# Patient Record
Sex: Female | Born: 1960 | Race: Black or African American | Hispanic: No | Marital: Single | Smoking: Former smoker
Health system: Southern US, Community
[De-identification: ages and names within clinical notes are randomized; demographics above are authoritative.]

## PROBLEM LIST (undated history)

## (undated) ENCOUNTER — Emergency Department (HOSPITAL_COMMUNITY): Payer: Medicaid Other | Source: Home / Self Care

## (undated) DIAGNOSIS — D649 Anemia, unspecified: Secondary | ICD-10-CM

## (undated) DIAGNOSIS — C50919 Malignant neoplasm of unspecified site of unspecified female breast: Secondary | ICD-10-CM

## (undated) DIAGNOSIS — Z923 Personal history of irradiation: Secondary | ICD-10-CM

## (undated) DIAGNOSIS — Z9221 Personal history of antineoplastic chemotherapy: Secondary | ICD-10-CM

## (undated) DIAGNOSIS — K7010 Alcoholic hepatitis without ascites: Secondary | ICD-10-CM

## (undated) DIAGNOSIS — F1020 Alcohol dependence, uncomplicated: Secondary | ICD-10-CM

## (undated) DIAGNOSIS — I10 Essential (primary) hypertension: Secondary | ICD-10-CM

## (undated) DIAGNOSIS — R0602 Shortness of breath: Secondary | ICD-10-CM

## (undated) HISTORY — DX: Alcoholic hepatitis without ascites: K70.10

## (undated) HISTORY — DX: Essential (primary) hypertension: I10

## (undated) HISTORY — DX: Alcohol dependence, uncomplicated: F10.20

## (undated) HISTORY — DX: Malignant neoplasm of unspecified site of unspecified female breast: C50.919

## (undated) HISTORY — DX: Anemia, unspecified: D64.9

---

## 1999-08-15 DIAGNOSIS — C50919 Malignant neoplasm of unspecified site of unspecified female breast: Secondary | ICD-10-CM

## 1999-08-15 HISTORY — PX: OTHER SURGICAL HISTORY: SHX169

## 1999-08-15 HISTORY — PX: BREAST LUMPECTOMY: SHX2

## 1999-08-15 HISTORY — DX: Malignant neoplasm of unspecified site of unspecified female breast: C50.919

## 2001-05-24 ENCOUNTER — Ambulatory Visit (HOSPITAL_COMMUNITY): Admission: RE | Admit: 2001-05-24 | Discharge: 2001-05-24 | Payer: Self-pay | Admitting: Family Medicine

## 2001-08-02 ENCOUNTER — Encounter: Payer: Self-pay | Admitting: Family Medicine

## 2001-08-02 ENCOUNTER — Encounter: Admission: RE | Admit: 2001-08-02 | Discharge: 2001-08-02 | Payer: Self-pay | Admitting: Family Medicine

## 2001-08-02 ENCOUNTER — Other Ambulatory Visit: Admission: RE | Admit: 2001-08-02 | Discharge: 2001-08-02 | Payer: Self-pay | Admitting: Radiology

## 2001-08-02 ENCOUNTER — Encounter (INDEPENDENT_AMBULATORY_CARE_PROVIDER_SITE_OTHER): Payer: Self-pay

## 2001-08-29 ENCOUNTER — Encounter (HOSPITAL_BASED_OUTPATIENT_CLINIC_OR_DEPARTMENT_OTHER): Payer: Self-pay | Admitting: General Surgery

## 2001-09-02 ENCOUNTER — Encounter (INDEPENDENT_AMBULATORY_CARE_PROVIDER_SITE_OTHER): Payer: Self-pay | Admitting: Specialist

## 2001-09-02 ENCOUNTER — Ambulatory Visit (HOSPITAL_COMMUNITY): Admission: RE | Admit: 2001-09-02 | Discharge: 2001-09-02 | Payer: Self-pay | Admitting: General Surgery

## 2002-01-30 ENCOUNTER — Ambulatory Visit: Admission: RE | Admit: 2002-01-30 | Discharge: 2002-04-30 | Payer: Self-pay | Admitting: *Deleted

## 2002-02-26 ENCOUNTER — Encounter: Admission: RE | Admit: 2002-02-26 | Discharge: 2002-02-26 | Payer: Self-pay | Admitting: *Deleted

## 2002-03-27 ENCOUNTER — Encounter: Admission: RE | Admit: 2002-03-27 | Discharge: 2002-03-27 | Payer: Self-pay | Admitting: *Deleted

## 2002-05-21 ENCOUNTER — Ambulatory Visit: Admission: RE | Admit: 2002-05-21 | Discharge: 2002-05-21 | Payer: Self-pay | Admitting: *Deleted

## 2002-05-26 ENCOUNTER — Encounter: Admission: RE | Admit: 2002-05-26 | Discharge: 2002-05-26 | Payer: Self-pay | Admitting: *Deleted

## 2002-10-08 ENCOUNTER — Encounter: Admission: RE | Admit: 2002-10-08 | Discharge: 2002-10-08 | Payer: Self-pay | Admitting: Oncology

## 2002-10-08 ENCOUNTER — Encounter: Payer: Self-pay | Admitting: Oncology

## 2003-03-24 ENCOUNTER — Encounter: Admission: RE | Admit: 2003-03-24 | Discharge: 2003-03-24 | Payer: Self-pay | Admitting: Oncology

## 2003-03-24 ENCOUNTER — Encounter: Payer: Self-pay | Admitting: Oncology

## 2003-07-27 ENCOUNTER — Ambulatory Visit (HOSPITAL_COMMUNITY): Admission: RE | Admit: 2003-07-27 | Discharge: 2003-07-27 | Payer: Self-pay | Admitting: Oncology

## 2003-12-21 ENCOUNTER — Encounter: Admission: RE | Admit: 2003-12-21 | Discharge: 2003-12-21 | Payer: Self-pay | Admitting: Family Medicine

## 2004-03-28 ENCOUNTER — Encounter: Admission: RE | Admit: 2004-03-28 | Discharge: 2004-03-28 | Payer: Self-pay | Admitting: Oncology

## 2004-06-21 ENCOUNTER — Ambulatory Visit: Payer: Self-pay | Admitting: Family Medicine

## 2004-07-27 ENCOUNTER — Emergency Department (HOSPITAL_COMMUNITY): Admission: EM | Admit: 2004-07-27 | Discharge: 2004-07-27 | Payer: Self-pay

## 2004-08-09 ENCOUNTER — Ambulatory Visit: Payer: Self-pay | Admitting: Family Medicine

## 2004-08-29 ENCOUNTER — Ambulatory Visit: Payer: Self-pay | Admitting: Oncology

## 2004-09-10 ENCOUNTER — Ambulatory Visit (HOSPITAL_COMMUNITY): Admission: RE | Admit: 2004-09-10 | Discharge: 2004-09-10 | Payer: Self-pay | Admitting: Neurology

## 2004-11-29 ENCOUNTER — Inpatient Hospital Stay (HOSPITAL_COMMUNITY): Admission: EM | Admit: 2004-11-29 | Discharge: 2004-11-29 | Payer: Self-pay | Admitting: Emergency Medicine

## 2004-12-21 ENCOUNTER — Ambulatory Visit: Payer: Self-pay | Admitting: Oncology

## 2005-02-13 ENCOUNTER — Emergency Department (HOSPITAL_COMMUNITY): Admission: EM | Admit: 2005-02-13 | Discharge: 2005-02-13 | Payer: Self-pay | Admitting: Emergency Medicine

## 2005-02-17 ENCOUNTER — Ambulatory Visit: Payer: Self-pay | Admitting: Psychiatry

## 2005-02-17 ENCOUNTER — Inpatient Hospital Stay (HOSPITAL_COMMUNITY): Admission: RE | Admit: 2005-02-17 | Discharge: 2005-02-23 | Payer: Self-pay | Admitting: Psychiatry

## 2005-02-20 ENCOUNTER — Ambulatory Visit: Payer: Self-pay | Admitting: Oncology

## 2005-03-06 ENCOUNTER — Ambulatory Visit (HOSPITAL_COMMUNITY): Admission: RE | Admit: 2005-03-06 | Discharge: 2005-03-06 | Payer: Self-pay | Admitting: Oncology

## 2005-03-13 ENCOUNTER — Ambulatory Visit: Payer: Self-pay | Admitting: Sports Medicine

## 2005-04-03 ENCOUNTER — Encounter: Admission: RE | Admit: 2005-04-03 | Discharge: 2005-04-03 | Payer: Self-pay | Admitting: Oncology

## 2005-04-03 ENCOUNTER — Ambulatory Visit: Payer: Self-pay | Admitting: Family Medicine

## 2005-05-26 ENCOUNTER — Ambulatory Visit: Payer: Self-pay | Admitting: Oncology

## 2005-07-13 ENCOUNTER — Ambulatory Visit: Payer: Self-pay | Admitting: Oncology

## 2005-09-11 ENCOUNTER — Ambulatory Visit: Payer: Self-pay | Admitting: Oncology

## 2005-09-19 ENCOUNTER — Ambulatory Visit: Payer: Self-pay | Admitting: Internal Medicine

## 2005-10-30 ENCOUNTER — Ambulatory Visit: Payer: Self-pay | Admitting: Internal Medicine

## 2005-11-29 ENCOUNTER — Ambulatory Visit: Payer: Self-pay | Admitting: Oncology

## 2005-12-04 LAB — MORPHOLOGY

## 2005-12-04 LAB — COMPREHENSIVE METABOLIC PANEL
BUN: 7 mg/dL (ref 6–23)
CO2: 24 mEq/L (ref 19–32)
Creatinine, Ser: 0.6 mg/dL (ref 0.4–1.2)
Glucose, Bld: 70 mg/dL (ref 70–99)
Total Bilirubin: 0.7 mg/dL (ref 0.3–1.2)
Total Protein: 7.3 g/dL (ref 6.0–8.3)

## 2005-12-04 LAB — CBC WITH DIFFERENTIAL/PLATELET
BASO%: 3.8 % — ABNORMAL HIGH (ref 0.0–2.0)
EOS%: 0.7 % (ref 0.0–7.0)
HCT: 22.9 % — ABNORMAL LOW (ref 34.8–46.6)
LYMPH%: 31.2 % (ref 14.0–48.0)
MCH: 28 pg (ref 26.0–34.0)
MCHC: 30.5 g/dL — ABNORMAL LOW (ref 32.0–36.0)
MCV: 91.6 fL (ref 81.0–101.0)
MONO#: 0.2 10*3/uL (ref 0.1–0.9)
MONO%: 5.9 % (ref 0.0–13.0)
NEUT%: 58.5 % (ref 39.6–76.8)
Platelets: 51 10*3/uL — ABNORMAL LOW (ref 145–400)

## 2005-12-04 LAB — IRON AND TIBC
%SAT: 22 % (ref 20–55)
Iron: 73 ug/dL (ref 42–145)
UIBC: 259 ug/dL

## 2005-12-04 LAB — ERYTHROCYTE SEDIMENTATION RATE: Sed Rate: 50 mm/hr (ref 0–30)

## 2005-12-04 LAB — FERRITIN: Ferritin: 31 ng/mL (ref 10–291)

## 2005-12-07 LAB — HAPTOGLOBIN: Haptoglobin: 64 mg/dL (ref 16–200)

## 2005-12-11 ENCOUNTER — Inpatient Hospital Stay (HOSPITAL_COMMUNITY): Admission: EM | Admit: 2005-12-11 | Discharge: 2005-12-15 | Payer: Self-pay | Admitting: Oncology

## 2005-12-12 ENCOUNTER — Ambulatory Visit: Payer: Self-pay | Admitting: Oncology

## 2005-12-14 ENCOUNTER — Ambulatory Visit: Payer: Self-pay | Admitting: Gastroenterology

## 2005-12-27 LAB — CBC WITH DIFFERENTIAL/PLATELET
BASO%: 0 % (ref 0.0–2.0)
EOS%: 0 % (ref 0.0–7.0)
HCT: 33.4 % — ABNORMAL LOW (ref 34.8–46.6)
LYMPH%: 7.6 % — ABNORMAL LOW (ref 14.0–48.0)
MCH: 30.1 pg (ref 26.0–34.0)
MCHC: 33.3 g/dL (ref 32.0–36.0)
NEUT%: 89.1 % — ABNORMAL HIGH (ref 39.6–76.8)
Platelets: 203 10*3/uL (ref 145–400)
RBC: 3.68 10*6/uL — ABNORMAL LOW (ref 3.70–5.32)
WBC: 8.6 10*3/uL (ref 3.9–10.0)

## 2005-12-27 LAB — COMPREHENSIVE METABOLIC PANEL
ALT: 23 U/L (ref 0–40)
AST: 77 U/L — ABNORMAL HIGH (ref 0–37)
Alkaline Phosphatase: 161 U/L — ABNORMAL HIGH (ref 39–117)
Creatinine, Ser: 0.8 mg/dL (ref 0.4–1.2)
Sodium: 145 mEq/L (ref 135–145)
Total Bilirubin: 0.5 mg/dL (ref 0.3–1.2)
Total Protein: 8.2 g/dL (ref 6.0–8.3)

## 2006-03-13 ENCOUNTER — Ambulatory Visit: Payer: Self-pay | Admitting: Oncology

## 2006-04-05 ENCOUNTER — Encounter: Admission: RE | Admit: 2006-04-05 | Discharge: 2006-04-05 | Payer: Self-pay | Admitting: Oncology

## 2006-05-17 ENCOUNTER — Ambulatory Visit: Payer: Self-pay | Admitting: Oncology

## 2006-06-11 ENCOUNTER — Ambulatory Visit: Payer: Self-pay | Admitting: Sports Medicine

## 2006-06-11 LAB — CONVERTED CEMR LAB
ALT: 24 units/L
Albumin: 4.2 g/dL
Calcium: 9.6 mg/dL
Folate: 18.6 ng/mL
Glucose, Bld: 89 mg/dL
HCT: 35.8 %
Hemoglobin: 12.1 g/dL
MCV: 104.9 fL
RDW: 15.1 %
Total Protein: 8.1 g/dL

## 2006-09-12 ENCOUNTER — Ambulatory Visit: Payer: Self-pay | Admitting: Oncology

## 2006-10-11 DIAGNOSIS — G609 Hereditary and idiopathic neuropathy, unspecified: Secondary | ICD-10-CM | POA: Insufficient documentation

## 2006-10-11 DIAGNOSIS — E876 Hypokalemia: Secondary | ICD-10-CM

## 2006-10-11 DIAGNOSIS — F1020 Alcohol dependence, uncomplicated: Secondary | ICD-10-CM | POA: Insufficient documentation

## 2006-10-11 DIAGNOSIS — I10 Essential (primary) hypertension: Secondary | ICD-10-CM

## 2006-10-11 DIAGNOSIS — M25519 Pain in unspecified shoulder: Secondary | ICD-10-CM

## 2006-10-17 LAB — CBC WITH DIFFERENTIAL/PLATELET
BASO%: 0.9 % (ref 0.0–2.0)
EOS%: 0.4 % (ref 0.0–7.0)
HCT: 31 % — ABNORMAL LOW (ref 34.8–46.6)
LYMPH%: 42.2 % (ref 14.0–48.0)
MCH: 36.1 pg — ABNORMAL HIGH (ref 26.0–34.0)
MCHC: 33.6 g/dL (ref 32.0–36.0)
NEUT%: 46.5 % (ref 39.6–76.8)
Platelets: 82 10*3/uL — ABNORMAL LOW (ref 145–400)
lymph#: 1.1 10*3/uL (ref 0.9–3.3)

## 2006-10-18 LAB — COMPREHENSIVE METABOLIC PANEL
ALT: 72 U/L — ABNORMAL HIGH (ref 0–35)
AST: 250 U/L — ABNORMAL HIGH (ref 0–37)
Creatinine, Ser: 0.61 mg/dL (ref 0.40–1.20)
Total Bilirubin: 1.5 mg/dL — ABNORMAL HIGH (ref 0.3–1.2)

## 2006-10-18 LAB — IRON AND TIBC
Iron: 132 ug/dL (ref 42–145)
TIBC: 234 ug/dL — ABNORMAL LOW (ref 250–470)
UIBC: 102 ug/dL

## 2006-10-18 LAB — FERRITIN: Ferritin: 517 ng/mL — ABNORMAL HIGH (ref 10–291)

## 2006-10-22 LAB — CBC WITH DIFFERENTIAL/PLATELET
Eosinophils Absolute: 0 10*3/uL (ref 0.0–0.5)
LYMPH%: 41.9 % (ref 14.0–48.0)
MONO#: 0.2 10*3/uL (ref 0.1–0.9)
NEUT#: 1.8 10*3/uL (ref 1.5–6.5)
Platelets: 114 10*3/uL — ABNORMAL LOW (ref 145–400)
RBC: 2.54 10*6/uL — ABNORMAL LOW (ref 3.70–5.32)
RDW: 15.3 % — ABNORMAL HIGH (ref 11.3–14.5)
WBC: 3.5 10*3/uL — ABNORMAL LOW (ref 3.9–10.0)

## 2006-10-22 LAB — LACTATE DEHYDROGENASE: LDH: 482 U/L — ABNORMAL HIGH (ref 94–250)

## 2006-10-22 LAB — COMPREHENSIVE METABOLIC PANEL
Albumin: 2.5 g/dL — ABNORMAL LOW (ref 3.5–5.2)
CO2: 32 mEq/L (ref 19–32)
Calcium: 7.6 mg/dL — ABNORMAL LOW (ref 8.4–10.5)
Glucose, Bld: 138 mg/dL — ABNORMAL HIGH (ref 70–99)
Potassium: 3.5 mEq/L (ref 3.5–5.3)
Sodium: 135 mEq/L (ref 135–145)
Total Protein: 3.8 g/dL — ABNORMAL LOW (ref 6.0–8.3)

## 2006-11-14 ENCOUNTER — Ambulatory Visit: Payer: Self-pay | Admitting: Oncology

## 2006-11-19 ENCOUNTER — Encounter (INDEPENDENT_AMBULATORY_CARE_PROVIDER_SITE_OTHER): Payer: Self-pay | Admitting: *Deleted

## 2006-11-19 LAB — CMP AND LIVER
ALT: 72 U/L — ABNORMAL HIGH (ref 0–35)
AST: 173 U/L — ABNORMAL HIGH (ref 0–37)
Bilirubin, Direct: 2.7 mg/dL — ABNORMAL HIGH (ref 0.0–0.3)
CO2: 21 mEq/L (ref 19–32)
Chloride: 99 mEq/L (ref 96–112)
Sodium: 136 mEq/L (ref 135–145)
Total Bilirubin: 5.1 mg/dL — ABNORMAL HIGH (ref 0.3–1.2)
Total Protein: 5.8 g/dL — ABNORMAL LOW (ref 6.0–8.3)

## 2006-11-19 LAB — CBC WITH DIFFERENTIAL/PLATELET
BASO%: 1.6 % (ref 0.0–2.0)
MCHC: 35 g/dL (ref 32.0–36.0)
MONO#: 0.2 10*3/uL (ref 0.1–0.9)
RBC: 2.22 10*6/uL — ABNORMAL LOW (ref 3.70–5.32)
WBC: 3.1 10*3/uL — ABNORMAL LOW (ref 3.9–10.0)
lymph#: 1 10*3/uL (ref 0.9–3.3)

## 2006-11-27 ENCOUNTER — Encounter (HOSPITAL_COMMUNITY): Admission: RE | Admit: 2006-11-27 | Discharge: 2007-02-25 | Payer: Self-pay | Admitting: Oncology

## 2006-11-27 ENCOUNTER — Encounter (INDEPENDENT_AMBULATORY_CARE_PROVIDER_SITE_OTHER): Payer: Self-pay | Admitting: *Deleted

## 2006-11-27 LAB — CBC WITH DIFFERENTIAL/PLATELET
Basophils Absolute: 0 10*3/uL (ref 0.0–0.1)
EOS%: 0.4 % (ref 0.0–7.0)
Eosinophils Absolute: 0 10*3/uL (ref 0.0–0.5)
LYMPH%: 38.4 % (ref 14.0–48.0)
MCH: 37.3 pg — ABNORMAL HIGH (ref 26.0–34.0)
MCV: 108.2 fL — ABNORMAL HIGH (ref 81.0–101.0)
MONO%: 5.5 % (ref 0.0–13.0)
NEUT#: 1.8 10*3/uL (ref 1.5–6.5)
Platelets: 90 10*3/uL — ABNORMAL LOW (ref 145–400)
RBC: 1.97 10*6/uL — ABNORMAL LOW (ref 3.70–5.32)

## 2006-11-27 LAB — COMPREHENSIVE METABOLIC PANEL
Alkaline Phosphatase: 312 U/L — ABNORMAL HIGH (ref 39–117)
CO2: 23 mEq/L (ref 19–32)
Creatinine, Ser: 0.53 mg/dL (ref 0.40–1.20)
Glucose, Bld: 127 mg/dL — ABNORMAL HIGH (ref 70–99)
Total Bilirubin: 2.9 mg/dL — ABNORMAL HIGH (ref 0.3–1.2)

## 2006-11-27 LAB — CALCIUM, IONIZED: Calcium, Ion: 0.85 mmol/L — ABNORMAL LOW (ref 1.12–1.32)

## 2006-11-27 LAB — RETICULOCYTES
IRF: 0.62 — ABNORMAL HIGH (ref 0.130–0.330)
Retic %: 5.6 % — ABNORMAL HIGH (ref 0.4–2.3)

## 2006-11-27 LAB — TECHNOLOGIST REVIEW

## 2006-11-28 ENCOUNTER — Ambulatory Visit (HOSPITAL_COMMUNITY): Admission: RE | Admit: 2006-11-28 | Discharge: 2006-11-28 | Payer: Self-pay | Admitting: Oncology

## 2006-11-28 LAB — MAGNESIUM: Magnesium: 0.8 mg/dL — CL (ref 1.5–2.5)

## 2006-11-30 LAB — MANUAL DIFFERENTIAL
ALC: 1.1 10*3/uL (ref 0.9–3.3)
LYMPH: 48 % (ref 14–49)
MONO: 5 % (ref 0–14)
Myelocytes: 0 % (ref 0–0)
Other Cell: 0 % (ref 0–0)
SEG: 43 % (ref 38–77)
Variant Lymph: 0 % (ref 0–0)

## 2006-11-30 LAB — CMP AND LIVER
Albumin: 2 g/dL — ABNORMAL LOW (ref 3.5–5.2)
Alkaline Phosphatase: 288 U/L — ABNORMAL HIGH (ref 39–117)
BUN: 8 mg/dL (ref 6–23)
Bilirubin, Direct: 0.8 mg/dL — ABNORMAL HIGH (ref 0.0–0.3)
Glucose, Bld: 96 mg/dL (ref 70–99)
Indirect Bilirubin: 1.2 mg/dL — ABNORMAL HIGH (ref 0.0–0.9)
Potassium: 3.2 mEq/L — ABNORMAL LOW (ref 3.5–5.3)

## 2006-11-30 LAB — CBC WITH DIFFERENTIAL/PLATELET
HCT: 22.3 % — ABNORMAL LOW (ref 34.8–46.6)
HGB: 7.2 g/dL — ABNORMAL LOW (ref 11.6–15.9)
Platelets: 141 10*3/uL — ABNORMAL LOW (ref 145–400)
WBC: 2.3 10*3/uL — ABNORMAL LOW (ref 3.9–10.0)

## 2006-11-30 LAB — RETICULOCYTES: RBC: UNDETERMINED 10*6/uL (ref 3.70–5.32)

## 2006-11-30 LAB — RETICULOCYTES (CHCC): RBC.: 2 MIL/uL — ABNORMAL LOW (ref 3.87–5.11)

## 2006-12-03 LAB — FOLATE RBC: RBC Folate: 499 ng/mL (ref 180–600)

## 2006-12-19 ENCOUNTER — Emergency Department (HOSPITAL_COMMUNITY): Admission: EM | Admit: 2006-12-19 | Discharge: 2006-12-19 | Payer: Self-pay | Admitting: *Deleted

## 2006-12-28 LAB — CBC & DIFF AND RETIC
Basophils Absolute: 0 10*3/uL (ref 0.0–0.1)
EOS%: 0.5 % (ref 0.0–7.0)
Eosinophils Absolute: 0 10*3/uL (ref 0.0–0.5)
HGB: 10 g/dL — ABNORMAL LOW (ref 11.6–15.9)
LYMPH%: 29.1 % (ref 14.0–48.0)
MCH: 36.1 pg — ABNORMAL HIGH (ref 26.0–34.0)
MCV: 105.6 fL — ABNORMAL HIGH (ref 81.0–101.0)
MONO%: 9.5 % (ref 0.0–13.0)
NEUT%: 60.1 % (ref 39.6–76.8)
Platelets: 432 10*3/uL — ABNORMAL HIGH (ref 145–400)
RDW: 19.7 % — ABNORMAL HIGH (ref 11.3–14.5)

## 2006-12-28 LAB — COMPREHENSIVE METABOLIC PANEL
Albumin: 3.4 g/dL — ABNORMAL LOW (ref 3.5–5.2)
BUN: 13 mg/dL (ref 6–23)
CO2: 30 mEq/L (ref 19–32)
Calcium: 9 mg/dL (ref 8.4–10.5)
Chloride: 102 mEq/L (ref 96–112)
Creatinine, Ser: 0.58 mg/dL (ref 0.40–1.20)
Glucose, Bld: 83 mg/dL (ref 70–99)
Potassium: 3.6 mEq/L (ref 3.5–5.3)

## 2006-12-28 LAB — MORPHOLOGY

## 2006-12-28 LAB — LACTATE DEHYDROGENASE: LDH: 205 U/L (ref 94–250)

## 2007-01-15 ENCOUNTER — Ambulatory Visit: Payer: Self-pay | Admitting: Oncology

## 2007-02-18 LAB — CBC WITH DIFFERENTIAL/PLATELET
Basophils Absolute: 0 10*3/uL (ref 0.0–0.1)
EOS%: 0.3 % (ref 0.0–7.0)
Eosinophils Absolute: 0 10*3/uL (ref 0.0–0.5)
HGB: 9.8 g/dL — ABNORMAL LOW (ref 11.6–15.9)
MCV: 103.1 fL — ABNORMAL HIGH (ref 81.0–101.0)
MONO%: 6.9 % (ref 0.0–13.0)
NEUT#: 2.2 10*3/uL (ref 1.5–6.5)
RBC: 2.71 10*6/uL — ABNORMAL LOW (ref 3.70–5.32)
RDW: 17.7 % — ABNORMAL HIGH (ref 11.3–14.5)
lymph#: 1 10*3/uL (ref 0.9–3.3)

## 2007-02-18 LAB — COMPREHENSIVE METABOLIC PANEL
AST: 199 U/L — ABNORMAL HIGH (ref 0–37)
Albumin: 3.1 g/dL — ABNORMAL LOW (ref 3.5–5.2)
Alkaline Phosphatase: 186 U/L — ABNORMAL HIGH (ref 39–117)
BUN: 4 mg/dL — ABNORMAL LOW (ref 6–23)
Calcium: 8 mg/dL — ABNORMAL LOW (ref 8.4–10.5)
Chloride: 104 mEq/L (ref 96–112)
Glucose, Bld: 98 mg/dL (ref 70–99)
Potassium: 3 mEq/L — ABNORMAL LOW (ref 3.5–5.3)
Sodium: 141 mEq/L (ref 135–145)
Total Protein: 7.6 g/dL (ref 6.0–8.3)

## 2007-02-18 LAB — CANCER ANTIGEN 27.29: CA 27.29: 10 U/mL (ref 0–39)

## 2007-03-01 ENCOUNTER — Emergency Department (HOSPITAL_COMMUNITY): Admission: EM | Admit: 2007-03-01 | Discharge: 2007-03-01 | Payer: Self-pay | Admitting: Emergency Medicine

## 2007-03-02 ENCOUNTER — Inpatient Hospital Stay: Payer: Self-pay | Admitting: Internal Medicine

## 2007-04-29 ENCOUNTER — Encounter: Admission: RE | Admit: 2007-04-29 | Discharge: 2007-04-29 | Payer: Self-pay | Admitting: Oncology

## 2007-04-29 ENCOUNTER — Ambulatory Visit: Payer: Self-pay | Admitting: Oncology

## 2007-06-12 ENCOUNTER — Ambulatory Visit: Payer: Self-pay | Admitting: Oncology

## 2007-09-16 ENCOUNTER — Ambulatory Visit: Payer: Self-pay | Admitting: Oncology

## 2007-09-25 ENCOUNTER — Encounter (INDEPENDENT_AMBULATORY_CARE_PROVIDER_SITE_OTHER): Payer: Self-pay | Admitting: *Deleted

## 2007-09-25 LAB — CBC & DIFF AND RETIC
Basophils Absolute: 0 10*3/uL (ref 0.0–0.1)
EOS%: 1 % (ref 0.0–7.0)
Eosinophils Absolute: 0 10*3/uL (ref 0.0–0.5)
HGB: 13.1 g/dL (ref 11.6–15.9)
LYMPH%: 26.3 % (ref 14.0–48.0)
MCH: 29.8 pg (ref 26.0–34.0)
MCV: 89.5 fL (ref 81.0–101.0)
MONO%: 9.5 % (ref 0.0–13.0)
NEUT#: 2.6 10*3/uL (ref 1.5–6.5)
Platelets: 245 10*3/uL (ref 145–400)
RBC: 4.38 10*6/uL (ref 3.70–5.32)

## 2007-09-25 LAB — MORPHOLOGY: PLT EST: ADEQUATE

## 2007-09-26 LAB — COMPREHENSIVE METABOLIC PANEL
ALT: 30 U/L (ref 0–35)
AST: 60 U/L — ABNORMAL HIGH (ref 0–37)
BUN: 11 mg/dL (ref 6–23)
Calcium: 9.6 mg/dL (ref 8.4–10.5)
Chloride: 99 mEq/L (ref 96–112)
Creatinine, Ser: 0.69 mg/dL (ref 0.40–1.20)
Total Bilirubin: 0.7 mg/dL (ref 0.3–1.2)

## 2007-09-26 LAB — FSH/LH
FSH: 77.4 m[IU]/mL
LH: 58.1 m[IU]/mL

## 2007-09-26 LAB — VITAMIN D 25 HYDROXY (VIT D DEFICIENCY, FRACTURES): Vit D, 25-Hydroxy: 10 ng/mL — ABNORMAL LOW (ref 30–89)

## 2007-10-01 LAB — VITAMIN D 1,25 DIHYDROXY: Vit D, 1,25-Dihydroxy: 39 pg/mL (ref 6–62)

## 2007-10-02 ENCOUNTER — Encounter (INDEPENDENT_AMBULATORY_CARE_PROVIDER_SITE_OTHER): Payer: Self-pay | Admitting: *Deleted

## 2007-10-30 ENCOUNTER — Encounter (INDEPENDENT_AMBULATORY_CARE_PROVIDER_SITE_OTHER): Payer: Self-pay | Admitting: *Deleted

## 2007-10-30 ENCOUNTER — Ambulatory Visit: Payer: Self-pay | Admitting: Family Medicine

## 2007-10-30 DIAGNOSIS — Z862 Personal history of diseases of the blood and blood-forming organs and certain disorders involving the immune mechanism: Secondary | ICD-10-CM

## 2007-10-30 DIAGNOSIS — H00019 Hordeolum externum unspecified eye, unspecified eyelid: Secondary | ICD-10-CM | POA: Insufficient documentation

## 2007-10-30 LAB — CONVERTED CEMR LAB
ALT: 21 units/L (ref 0–35)
AST: 27 units/L (ref 0–37)
CO2: 24 meq/L (ref 19–32)
Chloride: 101 meq/L (ref 96–112)
Folate: >20 ng/mL
Glucose, Bld: 98 mg/dL (ref 70–99)
HCT: 35.4 % — ABNORMAL LOW (ref 36.0–46.0)
Hemoglobin: 12.5 g/dL (ref 12.0–15.0)
MCV: 97.8 fL (ref 78.0–100.0)
Potassium: 4.4 meq/L (ref 3.5–5.3)
RBC: 3.62 M/uL — ABNORMAL LOW (ref 3.87–5.11)
Total Bilirubin: 0.6 mg/dL (ref 0.3–1.2)

## 2007-10-31 ENCOUNTER — Encounter (INDEPENDENT_AMBULATORY_CARE_PROVIDER_SITE_OTHER): Payer: Self-pay | Admitting: *Deleted

## 2007-11-18 ENCOUNTER — Encounter: Payer: Self-pay | Admitting: *Deleted

## 2007-12-17 ENCOUNTER — Encounter: Payer: Self-pay | Admitting: *Deleted

## 2007-12-23 ENCOUNTER — Ambulatory Visit: Payer: Self-pay | Admitting: Oncology

## 2008-02-07 ENCOUNTER — Ambulatory Visit: Payer: Self-pay | Admitting: Oncology

## 2008-08-12 ENCOUNTER — Encounter: Payer: Self-pay | Admitting: Family Medicine

## 2008-11-23 ENCOUNTER — Encounter: Payer: Self-pay | Admitting: Family Medicine

## 2008-11-23 ENCOUNTER — Ambulatory Visit: Payer: Self-pay | Admitting: Oncology

## 2008-11-23 LAB — CBC & DIFF AND RETIC
Basophils Absolute: 0.1 10*3/uL (ref 0.0–0.1)
EOS%: 0.2 % (ref 0.0–7.0)
Eosinophils Absolute: 0 10*3/uL (ref 0.0–0.5)
HCT: 35.4 % (ref 34.8–46.6)
HGB: 11.9 g/dL (ref 11.6–15.9)
IRF: 0.44 — ABNORMAL HIGH (ref 0.130–0.330)
MONO#: 0.2 10*3/uL (ref 0.1–0.9)
NEUT#: 3.7 10*3/uL (ref 1.5–6.5)
NEUT%: 66.9 % (ref 38.4–76.8)
RDW: 22.2 % — ABNORMAL HIGH (ref 11.2–14.5)
RETIC #: 26.1 10*3/uL (ref 19.7–115.1)
WBC: 5.5 10*3/uL (ref 3.9–10.3)
lymph#: 1.6 10*3/uL (ref 0.9–3.3)

## 2008-11-23 LAB — COMPREHENSIVE METABOLIC PANEL WITH GFR
ALT: 47 U/L — ABNORMAL HIGH (ref 0–35)
AST: 103 U/L — ABNORMAL HIGH (ref 0–37)
Albumin: 3.9 g/dL (ref 3.5–5.2)
Alkaline Phosphatase: 107 U/L (ref 39–117)
BUN: 11 mg/dL (ref 6–23)
CO2: 27 meq/L (ref 19–32)
Calcium: 8.8 mg/dL (ref 8.4–10.5)
Chloride: 105 meq/L (ref 96–112)
Creatinine, Ser: 0.79 mg/dL (ref 0.40–1.20)
Glucose, Bld: 111 mg/dL — ABNORMAL HIGH (ref 70–99)
Potassium: 3.6 meq/L (ref 3.5–5.3)
Sodium: 146 meq/L — ABNORMAL HIGH (ref 135–145)
Total Bilirubin: 1.2 mg/dL (ref 0.3–1.2)
Total Protein: 8.4 g/dL — ABNORMAL HIGH (ref 6.0–8.3)

## 2008-11-23 LAB — CANCER ANTIGEN 27.29: CA 27.29: 10 U/mL (ref 0–39)

## 2008-11-23 LAB — MORPHOLOGY: PLT EST: ADEQUATE

## 2008-11-23 LAB — LACTATE DEHYDROGENASE: LDH: 169 U/L (ref 94–250)

## 2008-12-01 ENCOUNTER — Encounter: Admission: RE | Admit: 2008-12-01 | Discharge: 2008-12-01 | Payer: Self-pay | Admitting: Oncology

## 2008-12-14 ENCOUNTER — Encounter (INDEPENDENT_AMBULATORY_CARE_PROVIDER_SITE_OTHER): Payer: Self-pay | Admitting: *Deleted

## 2008-12-23 ENCOUNTER — Telehealth (INDEPENDENT_AMBULATORY_CARE_PROVIDER_SITE_OTHER): Payer: Self-pay | Admitting: *Deleted

## 2009-01-25 ENCOUNTER — Ambulatory Visit: Payer: Self-pay | Admitting: Family Medicine

## 2009-01-25 ENCOUNTER — Encounter: Payer: Self-pay | Admitting: Family Medicine

## 2009-01-25 DIAGNOSIS — E781 Pure hyperglyceridemia: Secondary | ICD-10-CM | POA: Insufficient documentation

## 2009-01-25 LAB — CONVERTED CEMR LAB
Albumin: 3.9 g/dL (ref 3.5–5.2)
Alkaline Phosphatase: 96 units/L (ref 39–117)
CO2: 25 meq/L (ref 19–32)
Chloride: 104 meq/L (ref 96–112)
Cholesterol: 193 mg/dL (ref 0–200)
Folate: 4.4 ng/mL
HCT: 36.2 % (ref 36.0–46.0)
HDL: 108 mg/dL (ref >39)
Potassium: 4 meq/L (ref 3.5–5.3)
RDW: 19 % — ABNORMAL HIGH (ref 11.5–15.5)
Total CHOL/HDL Ratio: 1.8
VLDL: 73 mg/dL — ABNORMAL HIGH (ref 0–40)

## 2009-02-18 ENCOUNTER — Ambulatory Visit: Payer: Self-pay | Admitting: Oncology

## 2010-09-03 ENCOUNTER — Encounter: Payer: Self-pay | Admitting: Neurology

## 2010-09-03 ENCOUNTER — Encounter: Payer: Self-pay | Admitting: Oncology

## 2010-09-04 ENCOUNTER — Encounter: Payer: Self-pay | Admitting: Oncology

## 2010-09-05 ENCOUNTER — Encounter: Payer: Self-pay | Admitting: Oncology

## 2010-10-05 ENCOUNTER — Encounter: Payer: Self-pay | Admitting: *Deleted

## 2010-10-14 ENCOUNTER — Inpatient Hospital Stay (HOSPITAL_COMMUNITY)
Admission: EM | Admit: 2010-10-14 | Discharge: 2010-10-20 | DRG: 897 | Disposition: A | Discharge status: Home / Self Care | Payer: Self-pay | Attending: Family Medicine | Admitting: Family Medicine

## 2010-10-14 ENCOUNTER — Ambulatory Visit (INDEPENDENT_AMBULATORY_CARE_PROVIDER_SITE_OTHER): Payer: Self-pay

## 2010-10-14 ENCOUNTER — Inpatient Hospital Stay (INDEPENDENT_AMBULATORY_CARE_PROVIDER_SITE_OTHER)
Admission: RE | Admit: 2010-10-14 | Discharge: 2010-10-14 | Disposition: A | Discharge status: Home / Self Care | Payer: Self-pay | Source: Ambulatory Visit | Attending: Family Medicine | Admitting: Family Medicine

## 2010-10-14 ENCOUNTER — Emergency Department (HOSPITAL_COMMUNITY): Payer: Self-pay

## 2010-10-14 DIAGNOSIS — I10 Essential (primary) hypertension: Secondary | ICD-10-CM | POA: Diagnosis present

## 2010-10-14 DIAGNOSIS — E7220 Disorder of urea cycle metabolism, unspecified: Secondary | ICD-10-CM | POA: Diagnosis present

## 2010-10-14 DIAGNOSIS — F10931 Alcohol use, unspecified with withdrawal delirium: Principal | ICD-10-CM | POA: Diagnosis present

## 2010-10-14 DIAGNOSIS — R1110 Vomiting, unspecified: Secondary | ICD-10-CM

## 2010-10-14 DIAGNOSIS — E872 Acidosis, unspecified: Secondary | ICD-10-CM | POA: Diagnosis present

## 2010-10-14 DIAGNOSIS — F10231 Alcohol dependence with withdrawal delirium: Principal | ICD-10-CM | POA: Diagnosis present

## 2010-10-14 DIAGNOSIS — F3289 Other specified depressive episodes: Secondary | ICD-10-CM | POA: Diagnosis present

## 2010-10-14 DIAGNOSIS — E162 Hypoglycemia, unspecified: Secondary | ICD-10-CM

## 2010-10-14 DIAGNOSIS — F10229 Alcohol dependence with intoxication, unspecified: Secondary | ICD-10-CM | POA: Diagnosis present

## 2010-10-14 DIAGNOSIS — Z66 Do not resuscitate: Secondary | ICD-10-CM | POA: Diagnosis present

## 2010-10-14 DIAGNOSIS — G40909 Epilepsy, unspecified, not intractable, without status epilepticus: Secondary | ICD-10-CM | POA: Diagnosis present

## 2010-10-14 DIAGNOSIS — F329 Major depressive disorder, single episode, unspecified: Secondary | ICD-10-CM | POA: Diagnosis present

## 2010-10-14 DIAGNOSIS — Z853 Personal history of malignant neoplasm of breast: Secondary | ICD-10-CM

## 2010-10-14 DIAGNOSIS — E876 Hypokalemia: Secondary | ICD-10-CM | POA: Diagnosis present

## 2010-10-14 DIAGNOSIS — K709 Alcoholic liver disease, unspecified: Secondary | ICD-10-CM | POA: Diagnosis present

## 2010-10-14 DIAGNOSIS — D539 Nutritional anemia, unspecified: Secondary | ICD-10-CM | POA: Diagnosis present

## 2010-10-14 LAB — POCT I-STAT, CHEM 8
Calcium, Ion: 0.86 mmol/L — ABNORMAL LOW (ref 1.12–1.32)
Chloride: 115 mEq/L — ABNORMAL HIGH (ref 96–112)
Glucose, Bld: 39 mg/dL — CL (ref 70–99)
HCT: 40 % (ref 36.0–46.0)
Sodium: 145 mEq/L (ref 135–145)

## 2010-10-14 LAB — URINALYSIS, ROUTINE W REFLEX MICROSCOPIC
Bilirubin Urine: NEGATIVE
Glucose, UA: NEGATIVE mg/dL
Ketones, ur: 40 mg/dL — AB
Protein, ur: 100 mg/dL — AB
pH: 5 (ref 5.0–8.0)

## 2010-10-14 LAB — DIFFERENTIAL
Basophils Absolute: 0 10*3/uL (ref 0.0–0.1)
Basophils Relative: 0 % (ref 0–1)
Eosinophils Relative: 0 % (ref 0–5)
Lymphocytes Relative: 6 % — ABNORMAL LOW (ref 12–46)
Lymphs Abs: 0.8 10*3/uL (ref 0.7–4.0)
Monocytes Absolute: 1.4 10*3/uL — ABNORMAL HIGH (ref 0.1–1.0)
Monocytes Relative: 8 % (ref 3–12)
Neutro Abs: 11.5 10*3/uL — ABNORMAL HIGH (ref 1.7–7.7)
Neutrophils Relative %: 82 % — ABNORMAL HIGH (ref 43–77)
WBC Morphology: INCREASED

## 2010-10-14 LAB — URINE MICROSCOPIC-ADD ON

## 2010-10-14 LAB — OSMOLALITY: Osmolality: 313 mOsm/kg — ABNORMAL HIGH (ref 275–300)

## 2010-10-14 LAB — CBC
Hemoglobin: 10.6 g/dL — ABNORMAL LOW (ref 12.0–15.0)
MCH: 33.7 pg (ref 26.0–34.0)
MCH: 32.7 pg (ref 26.0–34.0)
MCHC: 31.6 g/dL (ref 30.0–36.0)
Platelets: 131 10*3/uL — ABNORMAL LOW (ref 150–400)
WBC: 15.9 10*3/uL — ABNORMAL HIGH (ref 4.0–10.5)
WBC: 13.4 10*3/uL — ABNORMAL HIGH (ref 4.0–10.5)

## 2010-10-14 LAB — COMPREHENSIVE METABOLIC PANEL
ALT: 146 U/L — ABNORMAL HIGH (ref 0–35)
Albumin: 3.2 g/dL — ABNORMAL LOW (ref 3.5–5.2)
BUN: 20 mg/dL (ref 6–23)
CO2: 9 mEq/L — CL (ref 19–32)
Calcium: 7.2 mg/dL — ABNORMAL LOW (ref 8.4–10.5)
Chloride: 104 mEq/L (ref 96–112)
Creatinine, Ser: 1.18 mg/dL (ref 0.4–1.2)
GFR calc Af Amer: 59 mL/min — ABNORMAL LOW (ref >60)
GFR calc non Af Amer: 49 mL/min — ABNORMAL LOW (ref >60)
Sodium: 142 mEq/L (ref 135–145)
Total Protein: 7.3 g/dL (ref 6.0–8.3)

## 2010-10-14 LAB — POCT CARDIAC MARKERS
CKMB, poc: 1.9 ng/mL (ref 1.0–8.0)
Myoglobin, poc: 145 ng/mL (ref 12–200)
Troponin i, poc: <0.05 ng/mL (ref 0.00–0.09)

## 2010-10-14 LAB — AMMONIA: Ammonia: 60 umol/L — ABNORMAL HIGH (ref 11–35)

## 2010-10-14 LAB — POCT I-STAT 3, ART BLOOD GAS (G3+)
pCO2 arterial: 20.9 mmHg — ABNORMAL LOW (ref 35.0–45.0)
pH, Arterial: 7.264 — ABNORMAL LOW (ref 7.350–7.400)

## 2010-10-14 LAB — PROTIME-INR
INR: 1.08 (ref 0.00–1.49)
Prothrombin Time: 14.2 seconds (ref 11.6–15.2)

## 2010-10-14 LAB — LIPASE, BLOOD: Lipase: 12 U/L (ref 11–59)

## 2010-10-14 LAB — RAPID URINE DRUG SCREEN, HOSP PERFORMED
Cocaine: NOT DETECTED
Tetrahydrocannabinol: NOT DETECTED

## 2010-10-14 LAB — PROCALCITONIN: Procalcitonin: <0.1 ng/mL

## 2010-10-14 LAB — APTT: aPTT: 32 seconds (ref 24–37)

## 2010-10-14 LAB — POCT PREGNANCY, URINE: Preg Test, Ur: NEGATIVE

## 2010-10-15 DIAGNOSIS — K769 Liver disease, unspecified: Secondary | ICD-10-CM

## 2010-10-15 DIAGNOSIS — F10239 Alcohol dependence with withdrawal, unspecified: Secondary | ICD-10-CM

## 2010-10-15 DIAGNOSIS — F1020 Alcohol dependence, uncomplicated: Secondary | ICD-10-CM

## 2010-10-15 LAB — HEPATIC FUNCTION PANEL
Albumin: 2.6 g/dL — ABNORMAL LOW (ref 3.5–5.2)
Alkaline Phosphatase: 134 U/L — ABNORMAL HIGH (ref 39–117)
Indirect Bilirubin: 1 mg/dL — ABNORMAL HIGH (ref 0.3–0.9)
Total Protein: 6.3 g/dL (ref 6.0–8.3)

## 2010-10-15 LAB — LACTIC ACID, PLASMA
Lactic Acid, Venous: 4.1 mmol/L — ABNORMAL HIGH (ref 0.5–2.2)
Lactic Acid, Venous: 5 mmol/L — ABNORMAL HIGH (ref 0.5–2.2)

## 2010-10-15 LAB — BASIC METABOLIC PANEL
BUN: 8 mg/dL (ref 6–23)
Chloride: 106 mEq/L (ref 96–112)
Creatinine, Ser: 0.81 mg/dL (ref 0.4–1.2)
GFR calc Af Amer: >60 mL/min (ref >60)
GFR calc non Af Amer: >60 mL/min (ref >60)

## 2010-10-15 LAB — VITAMIN B12: Vitamin B-12: 1543 pg/mL — ABNORMAL HIGH (ref 211–911)

## 2010-10-15 LAB — CBC
HCT: 29.3 % — ABNORMAL LOW (ref 36.0–46.0)
Hemoglobin: 10.2 g/dL — ABNORMAL LOW (ref 12.0–15.0)
MCH: 32.8 pg (ref 26.0–34.0)
MCHC: 34.8 g/dL (ref 30.0–36.0)
MCV: 97.3 fL (ref 78.0–100.0)
Platelets: 70 10*3/uL — ABNORMAL LOW (ref 150–400)
RBC: 2.93 MIL/uL — ABNORMAL LOW (ref 3.87–5.11)
RBC: 3 MIL/uL — ABNORMAL LOW (ref 3.87–5.11)
RDW: 16.6 % — ABNORMAL HIGH (ref 11.5–15.5)
WBC: 8.3 10*3/uL (ref 4.0–10.5)
WBC: 7.6 10*3/uL (ref 4.0–10.5)

## 2010-10-15 LAB — GLUCOSE, CAPILLARY: Glucose-Capillary: 104 mg/dL — ABNORMAL HIGH (ref 70–99)

## 2010-10-15 LAB — DIFFERENTIAL

## 2010-10-15 LAB — APTT: aPTT: 36 seconds (ref 24–37)

## 2010-10-15 LAB — HEMOGLOBIN A1C
Hgb A1c MFr Bld: 5.5 % (ref <5.7)
Mean Plasma Glucose: 111 mg/dL (ref <117)

## 2010-10-15 LAB — PROTIME-INR
INR: 1.02 (ref 0.00–1.49)
Prothrombin Time: 13.6 seconds (ref 11.6–15.2)

## 2010-10-15 LAB — MRSA PCR SCREENING: MRSA by PCR: NEGATIVE

## 2010-10-15 LAB — HIV ANTIBODY (ROUTINE TESTING W REFLEX): HIV: NONREACTIVE

## 2010-10-15 LAB — IRON AND TIBC: UIBC: 113 ug/dL

## 2010-10-16 LAB — GLUCOSE, CAPILLARY
Glucose-Capillary: 98 mg/dL (ref 70–99)
Glucose-Capillary: 112 mg/dL — ABNORMAL HIGH (ref 70–99)

## 2010-10-16 LAB — COMPREHENSIVE METABOLIC PANEL
ALT: 76 U/L — ABNORMAL HIGH (ref 0–35)
AST: 214 U/L — ABNORMAL HIGH (ref 0–37)
CO2: 25 mEq/L (ref 19–32)
Chloride: 107 mEq/L (ref 96–112)
Creatinine, Ser: 0.71 mg/dL (ref 0.4–1.2)
GFR calc Af Amer: >60 mL/min (ref >60)
GFR calc non Af Amer: >60 mL/min (ref >60)
Glucose, Bld: 97 mg/dL (ref 70–99)
Total Bilirubin: 0.9 mg/dL (ref 0.3–1.2)

## 2010-10-16 LAB — CBC
HCT: 30.3 % — ABNORMAL LOW (ref 36.0–46.0)
Hemoglobin: 10.4 g/dL — ABNORMAL LOW (ref 12.0–15.0)
MCH: 32.8 pg (ref 26.0–34.0)
MCHC: 34.3 g/dL (ref 30.0–36.0)
RBC: 3.17 MIL/uL — ABNORMAL LOW (ref 3.87–5.11)

## 2010-10-16 LAB — MAGNESIUM: Magnesium: 0.8 mg/dL — CL (ref 1.5–2.5)

## 2010-10-16 LAB — URINE CULTURE

## 2010-10-16 LAB — HEPATITIS PANEL, ACUTE: HCV Ab: NEGATIVE

## 2010-10-17 LAB — GLUCOSE, CAPILLARY: Glucose-Capillary: 97 mg/dL (ref 70–99)

## 2010-10-17 LAB — CBC
HCT: 29.4 % — ABNORMAL LOW (ref 36.0–46.0)
Hemoglobin: 10 g/dL — ABNORMAL LOW (ref 12.0–15.0)
MCV: 97.4 fL (ref 78.0–100.0)
RBC: 3.02 MIL/uL — ABNORMAL LOW (ref 3.87–5.11)
WBC: 4.5 10*3/uL (ref 4.0–10.5)

## 2010-10-17 LAB — COMPREHENSIVE METABOLIC PANEL
ALT: 69 U/L — ABNORMAL HIGH (ref 0–35)
Alkaline Phosphatase: 114 U/L (ref 39–117)
CO2: 26 mEq/L (ref 19–32)
Chloride: 108 mEq/L (ref 96–112)
Glucose, Bld: 70 mg/dL (ref 70–99)
Potassium: 3 mEq/L — ABNORMAL LOW (ref 3.5–5.1)
Sodium: 143 mEq/L (ref 135–145)
Total Protein: 7.6 g/dL (ref 6.0–8.3)

## 2010-10-18 LAB — LACTIC ACID, PLASMA: Lactic Acid, Venous: 3.8 mmol/L — ABNORMAL HIGH (ref 0.5–2.2)

## 2010-10-18 LAB — CBC
HCT: 33 % — ABNORMAL LOW (ref 36.0–46.0)
MCV: 98.8 fL (ref 78.0–100.0)
RDW: 16.5 % — ABNORMAL HIGH (ref 11.5–15.5)
WBC: 5 10*3/uL (ref 4.0–10.5)

## 2010-10-18 LAB — GLUCOSE, CAPILLARY
Glucose-Capillary: 99 mg/dL (ref 70–99)
Glucose-Capillary: 119 mg/dL — ABNORMAL HIGH (ref 70–99)

## 2010-10-18 LAB — COMPREHENSIVE METABOLIC PANEL
AST: 110 U/L — ABNORMAL HIGH (ref 0–37)
Albumin: 3 g/dL — ABNORMAL LOW (ref 3.5–5.2)
Alkaline Phosphatase: 104 U/L (ref 39–117)
BUN: 2 mg/dL — ABNORMAL LOW (ref 6–23)
CO2: 24 mEq/L (ref 19–32)
Chloride: 108 mEq/L (ref 96–112)
Potassium: 3.8 mEq/L (ref 3.5–5.1)
Total Bilirubin: 0.8 mg/dL (ref 0.3–1.2)

## 2010-10-19 LAB — COMPREHENSIVE METABOLIC PANEL
AST: 82 U/L — ABNORMAL HIGH (ref 0–37)
Albumin: 2.8 g/dL — ABNORMAL LOW (ref 3.5–5.2)
Alkaline Phosphatase: 131 U/L — ABNORMAL HIGH (ref 39–117)
Chloride: 108 mEq/L (ref 96–112)
GFR calc Af Amer: >60 mL/min (ref >60)
Potassium: 3.3 mEq/L — ABNORMAL LOW (ref 3.5–5.1)
Total Bilirubin: 0.5 mg/dL (ref 0.3–1.2)

## 2010-10-19 LAB — GLUCOSE, CAPILLARY: Glucose-Capillary: 228 mg/dL — ABNORMAL HIGH (ref 70–99)

## 2010-10-19 LAB — CBC
Platelets: 90 10*3/uL — ABNORMAL LOW (ref 150–400)
RBC: 2.99 MIL/uL — ABNORMAL LOW (ref 3.87–5.11)
WBC: 5.6 10*3/uL (ref 4.0–10.5)

## 2010-10-20 LAB — CBC
HCT: 29.4 % — ABNORMAL LOW (ref 36.0–46.0)
MCV: 98.3 fL (ref 78.0–100.0)
Platelets: 139 10*3/uL — ABNORMAL LOW (ref 150–400)
RBC: 2.99 MIL/uL — ABNORMAL LOW (ref 3.87–5.11)
WBC: 4.6 10*3/uL (ref 4.0–10.5)

## 2010-10-20 LAB — COMPREHENSIVE METABOLIC PANEL
ALT: 44 U/L — ABNORMAL HIGH (ref 0–35)
Albumin: 2.8 g/dL — ABNORMAL LOW (ref 3.5–5.2)
Alkaline Phosphatase: 124 U/L — ABNORMAL HIGH (ref 39–117)
BUN: 6 mg/dL (ref 6–23)
Chloride: 99 mEq/L (ref 96–112)
Potassium: 4.2 mEq/L (ref 3.5–5.1)
Total Bilirubin: 0.5 mg/dL (ref 0.3–1.2)

## 2010-10-20 LAB — GLUCOSE, CAPILLARY: Glucose-Capillary: 72 mg/dL (ref 70–99)

## 2010-10-21 LAB — CULTURE, BLOOD (ROUTINE X 2)
Culture  Setup Time: 201203030217
Culture: NO GROWTH

## 2010-10-24 ENCOUNTER — Telehealth: Payer: Self-pay | Admitting: Family Medicine

## 2010-10-24 ENCOUNTER — Telehealth: Payer: Self-pay | Admitting: *Deleted

## 2010-10-24 NOTE — Telephone Encounter (Signed)
Was given Norvasc when she left the hospital and she cannot afford that.  Needs something on the $4 plan instead. [facility] [address]

## 2010-10-24 NOTE — Discharge Summary (Signed)
NAMEJERRINE, [name]               ACCOUNT NO.:  [id number]  MEDICAL RECORD NO.:  [id number]           PATIENT TYPE:  I  LOCATION:  3702                         FACILITY:  [facility]  PHYSICIAN:  Leighton Roach Sharonne Ricketts, M.D.DATE OF BIRTH:  30-May-1961  DATE OF ADMISSION:  10/14/2010 DATE OF DISCHARGE:  10/20/2010                              DISCHARGE SUMMARY   DISCHARGE DIAGNOSES: 1. Acute alcohol intoxication, resolved. 2. Alcohol withdrawal syndrome. 3. Hypertension. 4. Alcohol Dependence, continuous  DISCHARGE MEDICATIONS: 1. Librium 25 mg tablet, take two tablets x1 day, then one tablet x1     day. 2. Norvasc 10 mg p.o. daily. 3. Thiamine 100 mg p.o. daily. 4. Folic acid 1 mg p.o. daily. 5. Calcium carbonate 1250 mg tabs p.o. daily. 6. Motrin 600 mg p.o. q.6 h. p.r.n. 7. Multivitamin p.o. daily.  LABORATORY DATA AND IMAGING: 1. UPREG negative. 2. Fecal occult blood positive. 3. Ammonia 60. 4. HIV negative. 5. UDS negative. 6. Acute hepatitis panel negative. 7. LFTs at discharge, AST 56, ALT 44, alkaline phosphatase 124,     creatinine 0.61, platelets 139. 8. Chest x-ray negative.  BRIEF HOSPITAL COURSE:  This is a 50 year old female with a history of alcohol abuse who presented with nausea, vomiting, and anion gap metabolic acidosis, transaminitis, hypoglycemia concerning for acute alcohol intoxication and subsequently underwent alcohol withdrawal during hospitalization. 1. Intoxication.  The patient was admitted due to nausea, vomiting,     tremors, and found in the emergency department to have an anion gap     of 29, lactic acid of 7, and AST elevated to 1711 with ALT of 49.     The patient's alcohol level was 74, and her Tylenol, aspirin, and     UDS studies were negative.  The patient admitted to being a chronic     alcoholic and denied any other ingestions.  Viral hepatitis panel     was negative, and the patient was monitored on CIWA protocol and     hydrated with  IV fluids with resolution of her metabolic     derangements.  Her LFTs had nearly resolved to normal with AST of     56 and ALT of 44 at the time of discharge.  In addition, the     patient's platelets were borderline low at 139 and coag panel was     normal. 2. Alcohol withdrawal.  Shortly after admission, the patient began     experiencing increased agitation, combativeness, hypertension, and     tachycardia.  She was monitored on CIWA protocol and dosed with     Ativan accordingly.  She required additional doses of Ativan up to     4 mg q.4 h.  The patient was weaned from this protocol on to a     longer acting benzodiazepine, Librium.  She was monitored for 24     hours on this medication and found to have CIWA scores ranging from     0 to 2.  The patient will continue Librium for 2 days after     discharge for a short taper.  She never experienced any  seizure     activity or focal neurologic deficits during her withdrawal.  The     patient did verbalize her desire to refuse rehab services at this     time, and it was noted she had full capacity for a decision-making.     Cessation counseling and social work resources were offered to her;     however, the patient refused. 3. Hypertension.  The patient was noted to have hypertension on     arrival and in her past medical history, however, she was not     taking any medications at the time of admission.  She required     doses of hydralazine and labetalol per IV and was monitored on     clonidine patch for her autonomic instability during withdrawal.     She was started on Norvasc 5 mg and titrated to 10 mg daily at the     time of discharge.  Her blood pressures had improved with systolics     of 140s to 160s over 90s to diastolic.  This may be titrated at an     outpatient visit for tighter control.  Discussion about adding an     additional ACE inhibitor or beta-blocker may be indicated. 4. Chronic alcoholism.  The patient was  counseled numerous times on     the harmful health effects of alcoholism.  She was noted to have     macrocytosis on her CBC and started on multivitamin, thiamine, and     B1 replacement due to her deficiencies. 5. Positive Hemoccult.  The patient denied visualization of blood in     her emesis or stool.  However, it is possible her stool contained     hemoglobin due to chronic vomiting.  The patient will likely     benefit from repeat testing and/or colonoscopy/GI followup as an     outpatient.  DISCHARGE INSTRUCTIONS:  The patient was discharged home with no activity restrictions and to consume a heart-healthy diet.  Recommended she refrain from alcohol.  She will return to the ED or emergency care for shaking seizure activity, loss of consciousness, or any other concerning symptoms.  DISCHARGE APPOINTMENTS:  Dr. Jeanice Lim at [facility] on November 03, 2010, at 11:15 a.m.  FOLLOWUP ISSUES: 1. Hypertension. 2. Alcohol abuse. 3. Possible GI referral for heme-positive stools vs. monitoring.  PLAN:  The patient is discharged to home in stable medical condition.    ______________________________ Lloyd Huger, MD   ______________________________ Leighton Roach Jveon Pound, M.D.    JK/MEDQ  D:  10/20/2010  T:  10/21/2010  Job:  [id number]  Electronically Signed by Lloyd Huger MD on 10/23/2010 10:29:52 PM Electronically Signed by Acquanetta Belling M.D. on 10/24/2010 02:22:35 PM

## 2010-10-24 NOTE — Telephone Encounter (Signed)
Called pt and advised to schedule OV for f/up on Hospital stay and also to discuss HTN meds. After being seen by [place], she can apply for the 'orange card' and can get her medications through the [facility]. Pt agreed and will schedule OV. Arlyss Repress

## 2010-10-25 ENCOUNTER — Other Ambulatory Visit: Payer: Self-pay | Admitting: *Deleted

## 2010-10-25 ENCOUNTER — Telehealth: Payer: Self-pay | Admitting: *Deleted

## 2010-10-25 DIAGNOSIS — I10 Essential (primary) hypertension: Secondary | ICD-10-CM

## 2010-10-25 MED ORDER — AMLODIPINE BESYLATE 5 MG PO TABS: 5.0000 mg | ORAL_TABLET | Freq: Every day | ORAL | Status: DC

## 2010-10-25 NOTE — Telephone Encounter (Signed)
Consulted with Dr. Leveda Anna and he advises for patient to come in for BP check tomorrow . Will send message to Dr. Cristal Ford. message left on voicemail for patient to come in for BP check.

## 2010-10-25 NOTE — Telephone Encounter (Signed)
Paged Dr. Cristal Ford to ask her about this .

## 2010-10-25 NOTE — Telephone Encounter (Signed)
Pt called and she is out of Norvasc, wants only enough pills send to her pharmacy until her ov with Dr.[place] on 11-03-10, because she wants to discuss changing of Norvasc, because she can not afford it. I also informed her again, that she can see D.Hill for help after her visit with Dr.[place], but we can not change BP meds w/out a OV. See previous note. I sent Norvasc to her pharmacy. Fwd. To Dr.[place] for review. Arlyss Repress

## 2010-10-26 MED ORDER — AMLODIPINE BESYLATE 10 MG PO TABS: 10.0000 mg | ORAL_TABLET | Freq: Every day | ORAL | Status: DC

## 2010-10-26 NOTE — Telephone Encounter (Signed)
recieved notice from pharmacy stating  they have on file an RX for amlodipine 10 mg from Dr. Cristal Ford and an Rx for 5 mg that was sent in yesterday. They are wanting to know which is correct.  Hospital discharge note states Norvasc 10 mg  Paged Dr. Cristal Ford and she advised patient needs to be on 10 mg daily. Called pharmacy and advised to fill 10 mg and only give 9 tabs as this is what patient requested yesterday.,( see Note ) patient to follow up on 11/03/2010.

## 2010-10-26 NOTE — Telephone Encounter (Signed)
See message from  10/24/2010.  Message was left for patient after this message . will follow up at check up.

## 2010-10-26 NOTE — Telephone Encounter (Signed)
Addended by: Theresia Lo on: 10/26/2010 10:38 AM   Modules accepted: Orders

## 2010-11-03 ENCOUNTER — Encounter: Payer: Self-pay | Admitting: Family Medicine

## 2010-11-03 ENCOUNTER — Ambulatory Visit (INDEPENDENT_AMBULATORY_CARE_PROVIDER_SITE_OTHER): Payer: Self-pay | Admitting: Family Medicine

## 2010-11-03 VITALS — BP 126/80 | HR 78 | Temp 98.8°F | Ht 67.0 in | Wt 112.9 lb

## 2010-11-03 DIAGNOSIS — Z720 Tobacco use: Secondary | ICD-10-CM | POA: Insufficient documentation

## 2010-11-03 DIAGNOSIS — F17200 Nicotine dependence, unspecified, uncomplicated: Secondary | ICD-10-CM

## 2010-11-03 DIAGNOSIS — I10 Essential (primary) hypertension: Secondary | ICD-10-CM

## 2010-11-03 DIAGNOSIS — R05 Cough: Secondary | ICD-10-CM

## 2010-11-03 DIAGNOSIS — R059 Cough, unspecified: Secondary | ICD-10-CM | POA: Insufficient documentation

## 2010-11-03 DIAGNOSIS — F1010 Alcohol abuse, uncomplicated: Secondary | ICD-10-CM

## 2010-11-03 DIAGNOSIS — K7010 Alcoholic hepatitis without ascites: Secondary | ICD-10-CM

## 2010-11-03 MED ORDER — THERA VITAL M PO TABS: 1.0000 | ORAL_TABLET | Freq: Every day | ORAL | Status: DC

## 2010-11-03 MED ORDER — AMLODIPINE BESYLATE 10 MG PO TABS: 10.0000 mg | ORAL_TABLET | Freq: Every day | ORAL | Status: DC

## 2010-11-03 NOTE — Progress Notes (Signed)
  Subjective:    Patient ID: [name], female    DOB: Apr 02, 1961, 50 y.o.   MRN: [id number]  HPI  Here for hospital follow-up, states she is doing well, hospilized for ETOH withdrawel and HTN      Has not had a drink of alcohol since being discharged        Prior to admission drank  1/5 liquor daily 80 Proof, did not eat well. Moved in with Aunt until she can find a job        ETOH- declines any assistance such as therapy or AA, completed librium taper, admits she started smoking again 1 cig/day for stress because she cant drink. She states she began to drink heavy after she was laid off 1 year ago before christmas       ETOH Hepatitis- enzymes initially 1700 upon admission, down to 50 (AST) at discharge, this was reviewed with pt            HTN- does not take BP at home, tolerating medication, no leg swelling            Cough- dry cough for many months, had prior to admission, occurs at rest, worried she had pneumonia. No fever, no wheezing, Reviewed chart- HIV neg, PPD neg, CXR- Neg                    [phone]    Review of Systems No CP, no SOB, no emesis, denies shakes, palpitations , no HA, appetite good, +energy, sweating      Objective:   Physical Exam   GEN-NAD, alert and oriented   CVS- RRR, no murmur   RESP- CTAB   Ext- no edema, pulses 2+       Assessment & Plan:

## 2010-11-03 NOTE — Assessment & Plan Note (Signed)
Recheck CMET Vast resolution in her LFT during admission with rehyration

## 2010-11-03 NOTE — Assessment & Plan Note (Signed)
Currently at goal, will continue Norvasc

## 2010-11-03 NOTE — Assessment & Plan Note (Signed)
No evidence of infection PPD neg CXR reviewed neg Does not appear to be GI Discussed importance of tobacco cessation, though this time is very critical for her to not lapse, so I did not push this issue very much

## 2010-11-03 NOTE — Assessment & Plan Note (Addendum)
Pt declines any intervention. Completed librium taper Will monitor, explained importance of no alcohol as she did ask at the end of the visit if she could have beer. I am not convinced she understands the gravity of her problem. As her finances are tight, we will add MVI at this time, there were no signs of Werneike Encephalpathy during visit

## 2010-11-03 NOTE — Patient Instructions (Signed)
Congratulations on not drinking any alcohol Take the paper to [employer] Continue the Amlodipine- take 1 tablet daily for your blood pressure Take the Multivitamin daily Next appointment in 1 month to follow-up your blood pressure We will call you with results about your blood work

## 2010-11-04 ENCOUNTER — Telehealth: Payer: Self-pay | Admitting: Family Medicine

## 2010-11-04 LAB — COMPREHENSIVE METABOLIC PANEL
AST: 24 U/L (ref 0–37)
Alkaline Phosphatase: 97 U/L (ref 39–117)
BUN: 10 mg/dL (ref 6–23)
Glucose, Bld: 95 mg/dL (ref 70–99)
Total Bilirubin: 0.4 mg/dL (ref 0.3–1.2)

## 2010-11-04 NOTE — Telephone Encounter (Signed)
I left a voice message for pt to return call, if she does nurse may give her following message Her kidney function was normal, her liver enzymes have returned to normal, showing the healing process from her alcohol. Her calcium level is slightly high, we gave her supplements in the hospital. I want to see her in 1 month, at that time we will repeat her labs.

## 2010-11-08 ENCOUNTER — Telehealth: Payer: Self-pay | Admitting: Family Medicine

## 2010-11-08 ENCOUNTER — Encounter: Payer: Self-pay | Admitting: Family Medicine

## 2010-11-08 NOTE — Telephone Encounter (Signed)
Left message, letter sent.

## 2010-11-08 NOTE — Telephone Encounter (Signed)
Pt is returning MD's call.

## 2010-11-08 NOTE — Telephone Encounter (Signed)
Attempted to call again, will send a letter

## 2010-12-02 ENCOUNTER — Ambulatory Visit: Payer: Self-pay | Admitting: Family Medicine

## 2010-12-09 ENCOUNTER — Ambulatory Visit: Payer: Self-pay | Admitting: Family Medicine

## 2010-12-30 NOTE — H&P (Signed)
[name], [name]               ACCOUNT NO.:  [id number]   MEDICAL RECORD NO.:  [id number]          PATIENT TYPE:  IPS   LOCATION:  0305                          FACILITY:  [facility]   PHYSICIAN:  Geoffery Lyons, M.D.      DATE OF BIRTH:  [birth year]   DATE OF ADMISSION:  02/17/2005  DATE OF DISCHARGE:                         PSYCHIATRIC ADMISSION ASSESSMENT   This is a voluntary admission to the services of Dr. Geoffery Lyons.   IDENTIFYING INFORMATION:  This is a 50 year old single African-American  female.  Apparently she presented to the [facility] Emergency Department  last night.  She requested detoxification from alcohol.  She reported  drinking a fifth of liquor every day for years.  She last drank prior to  admission.  She was calm and cooperative.  In the emergency room her CWAS  was 12, her alcohol level was 82.  Her urine drug screen was negative for  other substances.  Her last admission was November 28, 2004.  She was put into  the [facility] Emergency Room because of marked confusion and lethargy.  Her  sister was concerned about her welfare at that time.  She had a blood  alcohol level of 452 with a potassium level of 4.7.  She was kept overnight  for observation.  She was discharged as [name] felt that she did not  have an alcohol problem and did not wish to be evaluated for detoxification.  Therefore, she was discharged to the care of her sister.  She is followed at  [facility] with Dr. Maryelizabeth Rowan and also by Dr. Pierce Crane of  the cancer center.  Today she denies any underlying depression.  She denies  any need for any other medication.  She reportedly had tried to her own and  had seizures.  She has not been known to have seizures, and I do not know  if this represent blackouts or whatever.   PAST PSYCHIATRIC HISTORY:  She has had no prior inpatient or outpatient  therapy.   SOCIAL HISTORY:  She graduated from [employer] in [place].  She is not  working at this time.  She has no income.  She has no children.  She does  have one sister who lives here in [place].   FAMILY HISTORY:  She denies.   ALCOHOL AND DRUG HISTORY:  Liquor and beer since age 75.   PRIMARY CARE Saanya Zieske:  Dr. Caron Presume at the cancer center.  Dr. Greer Pickerel at  family practice at [facility].   CURRENT MEDICATIONS:  She is prescribed  1.  Hydrochlorothiazide 25 mg daily to treat hypertension.  2.  Apparently she has been prescribed Lorazepam, although I was unable to      verify how much or when.  She states that she uses [facility] on [address].  They were not answering the phone when I tried to call [phone].   DRUG ALLERGIES:  No known drug allergies.   POSITIVE PHYSICAL FINDINGS:  She is status post a right partial mastectomy  which was done for  carcinoma of the right breast in January 2003.   ADMISSION DIAGNOSES:   AXIS I:  1.  Alcohol dependence.  2.  Rule out depression.   AXIS II:  Deferred.   AXIS III:  1.  Status post partial right breast mastectomy for breast cancer September 02, 2001.  2.  Hypertension.   AXIS IV:  Severe.   AXIS V:  50.   PLAN:  We will admit to safely detox.  As she refuses treatment at this  time, but we will continue to assess for underlying depression and initiate  treatment as indicated.       MD/MEDQ  D:  02/18/2005  T:  02/18/2005  Job:  [id number]

## 2010-12-30 NOTE — Discharge Summary (Signed)
NAMEDERICKA, OSTENSON               ACCOUNT NO.:  [id number]   MEDICAL RECORD NO.:  [id number]          PATIENT TYPE:  INP   LOCATION:  3104                         FACILITY:  [facility]   PHYSICIAN:  Sherin Quarry, MD      DATE OF BIRTH:  January 30, 1961   DATE OF ADMISSION:  11/28/2004  DATE OF DISCHARGE:                                 DISCHARGE SUMMARY   [name] is a 50 year old lady with a longstanding history of alcohol  abuse.  Her other main chronic problem is hypertension for which she takes  hydrochlorothiazide.  [name] has been consuming large amounts of alcohol  recently which has resulted in her falling frequently and behaving  strangely.  She was transported to the [facility] Emergency Room on November 28, 2004, because of marked confusion and lethargy and concern by her sister  about her welfare.  There had been no reports of nausea, vomiting, chest  pain, abdominal pain, diarrhea, fever or cough.  In the emergency room, the  patient was found to have a blood alcohol level of 452 with a potassium  level of 2.7.  Because she was very lethargic and confused, it was felt best  to go ahead and admit her for overnight observation.   PHYSICAL EXAMINATION:  Physical examination at the time of admission as  described by Dr. Kela Millin.  VITAL SIGNS:  Temperature 97.1, blood pressure 140/100, pulse 90,  respiratory rate 14, O2 saturation 96%.  HEENT:  Within normal limits.  CHEST:  Clear.  CARDIOVASCULAR:  Normal S1 and S2 without murmurs, rubs, or gallops.  ABDOMEN:  Benign.  Normal bowel sounds without masses, tenderness or  organomegaly.  NEUROLOGIC:  The patient was sedated.  She would not follow commands  voluntarily.  She was observed to move all extremities.   STUDIES IN THE EMERGENCY ROOM:  CT scan of the brain which showed no acute  findings.  There was evidence of cortical atrophy.  Chest x-ray showed no  acute findings.   Potassium level was noted to be  2.7.  The remainder of the CMET was  unremarkable.  Albumin level was 2.7.  AST was 100 consistent with her  history of chronic alcohol abuse.  Hemoglobin 11.7.   IMPRESSION:  Acute alcohol intoxication.   The patient was admitted for observation.  She was administered thiamine 100  mg IV daily, Protonix 40 mg was given IV.  The patient was given Ativan on a  p.r.n. basis for agitation.  IV of normal saline at 100 mL per hour was  administered.  By the next morning, the patient was awake and alert.  She  was able to answer questions.  I discussed her case with her sister who  indicates that she has a longstanding history of alcohol abuse and has  refused all efforts of the family to obtain treatment for her.  I discussed  the possibility of arranging for alcohol detoxification and [name]  indicated that she does not have an alcohol problem and does not wish to be  evaluated for detoxification.  Therefore, I indicated to her sister that she  could come and get her from the hospital.  She will be discharged.   DISCHARGE DIAGNOSES:  1.  Acute and chronic alcohol intoxication.  2.  Hypokalemia.  3.  Hypertension.  4.  History of breast cancer.   On discharge, she will be advised to continue her hydrochlorothiazide.  She  will also be given a potassium pill in the form of K-Dur 20 mEq daily with  instructions to take one daily.  She will follow up with Dr. Maryelizabeth Rowan in  the [facility] and with Dr. Donnie Coffin of the cancer  center.       ___________________________________________  Sherin Quarry, MD    SY/MEDQ  D:  11/29/2004  T:  11/29/2004  Job:  [id number]   cc:   Conchita Paris  [address]  [place]  [place] [address]  Fax: [phone]   Pierce Crane, M.D.  [address] [facility]  [place]  [place] [address]  Fax: [phone]

## 2010-12-30 NOTE — Op Note (Signed)
[place]  Patient:    [name], [name] Visit Number: [id number] MRN: [id number]          Service Type: DSU Location: RCRM 2550 03 Attending Physician:  Sonda Primes Dictated by:   Mardene Celeste. Lurene Shadow, M.D. Proc. Date: 09/02/01 Admit Date:  09/02/2001 Discharge Date: 09/02/2001                             Operative Report  PREOPERATIVE DIAGNOSIS:  Carcinoma of the right breast.  POSTOPERATIVE DIAGNOSIS:  Carcinoma of the right breast.  OPERATION PERFORMED:  A partial mastectomy of the right breast with sentinel lymph node dissection.  SURGEON:  Mardene Celeste. Lurene Shadow, M.D.  ASSISTANT:  Nurse.  ANESTHESIA:  General.  INDICATIONS FOR PROCEDURE:  The patient is a 50 year old woman presenting with an abnormal mammogram and a mass in the region of the nipple of the right breast with the tumor emerging subjacent and inferior to the nipple.  The patient was brought to the operating room after the risks and potential benefits of surgery had been fully discussed with her.  She is aware that because of the central location of the lesion that her nipple will also have to be removed.  She is brought now for partial mastectomy with sentinel lymph node dissection and possible axillary lymph node dissection.  DESCRIPTION OF PROCEDURE:  Following the induction of satisfactory general anesthesia, the patient was positioned supinely, injected the periareolar tissues with 5 cc of Lymphazurin dye, this area having been injected previously with radionuclide isotope.  The breast was then prepped and draped to be included in the sterile operative field, the Neoprobe was applied to the breast around the nipple and then used to locate an area in the axilla with most likely an access window to the sentinel node.  I made an elliptical incision around the nipple and deepened this through the skin and subcutaneous tissues dissecting wide margins around the  tumor on all sides maintaining hemostasis with the electrocautery.  The entire tumor and nipple were removed in their entirety along with the ellipse of skin and forwarded for pathologic evaluation.  Touch Preps of these margins showed no evidence of carcinoma outside the tumor margins.  Hemostasis was secured within the breast and the attention then turned to the right axilla.  A transverse axillary incision was made, deepened through the skin and subcutaneous tissues with the use of the Neoprobe.  The dissection was directed toward the sentinel node which could be seen and we dissected down through several blue lymphatics leading out toward it.  The Neoprobe registered approximately 1700 counts per second and the node was blue.  The node was dissected free using the electrocautery and hemostasis maintained with medium clips.  The node was forwarded for pathologic evaluation and Touch Prep.  The node showed no evidence of metastatic tumor.  Sponge, instrument and sharp counts were verified both in the right axilla and in the right breast.  The wounds were closed in layers as follows.  The axilla was closed with two layers of interrupted sutures using 3-0 Vicryl in the deep tissues and a running 4-0 Monocryl to close the axillary skin.  Breast tissue was similarly closed using a single layer of 3-0 Vicryl to close the subcutaneous tissues and running 4-0 Monocryl in the skin.  Both wounds were then injected with 0.5% Marcaine plain.  The wounds were reinforced with Steri-Strips. Sterile  dressings were applied.  Anesthetic reversed.  Patient removed from the operating room to the recovery room in stable condition having tolerated the procedure well. Dictated by:   Mardene Celeste. Lurene Shadow, M.D. Attending Physician:  Sonda Primes DD:  09/02/01 TD:  09/03/01 Job: [id number]

## 2010-12-30 NOTE — Discharge Summary (Signed)
[name], [name]               ACCOUNT NO.:  [id number]   MEDICAL RECORD NO.:  [id number]          PATIENT TYPE:  INP   LOCATION:  1319                         FACILITY:  [facility]   PHYSICIAN:  Pierce Crane, M.D.      DATE OF BIRTH:  [birth year]   DATE OF ADMISSION:  12/11/2005  DATE OF DISCHARGE:  12/15/2005                                 DISCHARGE SUMMARY   DISCHARGE DIAGNOSES:  1.  Pancytopenia.  2.  History of alcohol abuse.  3.  History of hypertension.  4.  History of T2 N0 breast cancer diagnosed in 2003, on tamoxifen.   This 50 year old African-American woman is seen in our practice for follow-  up of breast cancer.  She was initially diagnosed in 2003 and received  chemotherapy, which was completed in June 2003, and then radiation therapy,  which was completed in December 2003, and has been on tamoxifen.  She has  been seen in our clinic and intermittently found to be anemic and/or  pancytopenic.  She has been seen by Iva Boop, M.D., of GI, who has  previously done an endoscopy and colonoscopy in 2007.  This did not reveal  any obvious abnormalities.  It was felt that her anemia was related to  alcohol abuse on the basis of iron deficiency.  She agreed to be admitted to  the hospital for further evaluation when she was found to have to have a  platelet count of 40,000, a hemoglobin of 7, and a white count of 3.4.  We  had discussed doing a bone marrow biopsy to rule out other intrinsic bone  marrow problems.  She was admitted subsequently on December 11, 2005.   PHYSICAL EXAMINATION:  GENERAL:  On initial evaluation, an alert, anxious  woman looking stated age.  VITAL SIGNS:  Temperature was 98.1, pulse 88, respiratory rate 20, blood  pressure was 136/84, her O2 saturation was 98%.  HEAD AND NECK:  No palpable peripheral adenopathy.  Oropharynx normal.  No  scleral icterus.  LUNGS:  Clear.  CARDIAC:  Heart sounds normal.  ABDOMEN:  No obvious hepatosplenomegaly,  although spleen tip was felt to be  palpable on deep inspiration.  EXTREMITIES:  No peripheral edema.   INITIAL LABS:  White count is 4.4, hemoglobin 7.2, platelet count 70,000.  PTT was 49.  Chemistries:  Potassium was low at 3.1, creatinine was _____ .   COURSE IN THE HOSPITAL:  She was seen in consultation by Dr. Leone Payor, who  did not recommend further investigation, feeling that her anemia was related  to her alcohol use.  Stools for occult blood were recommended, and they were  found to be negative.  She was found to have an elevated PTT; baseline study  was suggestive of an inhibitor and no further workup was done for that.  She  was found to be iron-deficient.  B12 and folate levels were normal.  She was  started on oral iron as well as Venofer.  She did undergo a CT scan of the  abdomen and chest.  Abdomen showed a severe steatosis.  No other significant  abnormalities were seen.  She did not have obvious splenomegaly.  She was  seen in consultation by psychiatry, Dr. [facility], who made  recommendations regarding DT prophylaxis.  She was placed on an Ativan  protocol.  She did develop some agitation and anxiety, which was dealt with  with Ativan.  She remained relatively stable in hospital and her blood  pressure was monitored carefully and was elevated initially to 145/100, and  so she was placed on Norvasc and hydrochlorothiazide and ultimately was  given some Catapres p.r.n.  Her blood pressure ultimately did improve.  She  had no other complications in hospital.  A discussion was held regarding her  management at behavioral health.  The opinion of psychiatry was that she  needed to go to behavioral health, and arrangements were made for that  transfer.  [name] had declined voluntarily going there, and so paperwork had  to be filled out in that regard.  [name] had no other problems in hospital.  She was discharged _____ [facility] with the following  medication:   1.  _____ 10 mg b.i.d.  2.  Hydrochlorothiazide 25 mg daily.  3.  K-Dur 20 mEq t.i.d.  4.  Multivitamin one daily.  5.  Ferrous sulfate 325 mg t.i.d.  6.  Norvasc 10 mg a day.  7.  Ativan 1 mg p.o. q.6h. p.r.n.   She is asked to follow up in our office for the next scheduled visit.  Patient discharged from hospital in stable condition.   PROGNOSIS:  Good.      Pierce Crane, M.D.  Electronically Signed     PR/MEDQ  D:  12/15/2005  T:  12/15/2005  Job:  [id number]   cc:   Antonietta Breach, M.D.   Iva Boop, M.D. [facility]  [address] [place], [place] [address]

## 2010-12-30 NOTE — Discharge Summary (Signed)
[name], [name]               ACCOUNT NO.:  [id number]   MEDICAL RECORD NO.:  [id number]          PATIENT TYPE:  IPS   LOCATION:  0305                          [facility]ACILITY:  [facility]   PHYSICIAN:  Geoffery Lyons, M.D.      DATE OF BIRTH:  03-06-1961   DATE OF ADMISSION:  02/17/2005  DATE OF DISCHARGE:  02/23/2005                                 DISCHARGE SUMMARY   CHIEF COMPLAINT AND PRESENT ILLNESS:  This was the first admission to [facility] for this 50 year old single African-American female.  Presented to [facility] Emergency Department the night before requesting  detox from alcohol.  Reported drinking a fifth of liquor every day for  years.  She last drank prior to admission.  She was calm and cooperative in  the emergency room.  Her CIWA was 12.  Alcohol level was 82.  Urine drug  screen was negative for other substances.  On November 28, 2004, she was at the  [facility] Emergency Room because of confusion and lethargy.  She had a  blood alcohol level of 452.  She was kept overnight for observation.  She  was discharged as she did not feel that she needed any help.  She was  discharged to the care of her sister with follow up by [facility].  Denied any underlying depression.   PAST PSYCHIATRIC HISTORY:  No formal inpatient or outpatient psychiatric  treatment.   ALCOHOL/DRUG HISTORY:  Persistent use of alcohol.  Started using liquor and  beer at age 68.  Persistent use.  Minimized detox.   MED[facility] HISTORY:  [facility], Dr. Donnie Coffin.   MEDICATIONS:  Hydrochlorothiazide for hypertension.   PHYSICAL EXAMINATION:  Performed and failed to show any acute findings.   LABORATORY DATA:  CBC with white blood cells 4.4, hemoglobin 10.8, MCV 103.  Liver enzymes with SGOT 63, SGPT 25.  TSH 2.261.  B12 496, folate 251.   MENTAL STATUS EXAM:  Alert, cooperative female.  Mood depressed.  Affect  depressed.  Endorsed no particular symptoms.  Minimized the use  of alcohol.  No evidence of delusions.  No hallucinations.  Cognition was well-preserved.   ADMISSION DIAGNOSES:  AXIS I:  Alcohol dependence.  Depressive disorder not  otherwise specified.  AXIS II:  No diagnosis.  AXIS III:  Status post partial right breast mastectomy, hypertension.  AXIS IV:  Moderate.  AXIS V:  GAF upon admission 35; highest GAF in the last year 60.   HOSPITAL COURSE:  She was admitted and started in individual and group  psychotherapy.  Was given Ambien for sleep.  Maintained on  hydrochlorothiazide 25 mg per day, restored potassium 40 mEq in the morning.  She was detoxified with Librium.  Started on Lexapro 5 mg per day and later  was increased to 10 mg per day.  She did report a prior history of seizures  in June of 2006.  We pursued the detox.  Endorsed she was wanting to work  for long-term abstinence.  Living by herself but endorsed support from the  family.  Was wanting to be discharged from the hospital as soon as she was  detoxed.  She was wanting to leave.  She was given Ensure as she had lost  quite some weight.  She was living alone.  We were informed that she was not  going to be able to live in that same location.  She was evidencing  depression, some psychomotor retardation, minimum insight.  We continued to  detox.  She was having to find another place to live.  This was very  overwhelmed for her.  We pursued the Lexapro.  Started sleeping better.  Starting to get better.  Somewhat withdrawn.  Sad affect.  On July 12th, she  was endorsing that she was okay but still evidencing some psychomotor  retardation.  Sleeping, eating more but felt weak.  She would not validate  the depression yet, objectively, she looked very depressed.  Still dealing  with the stressors.  On July 13th, she was in full contact with reality.  Fully detoxed.  No suicidal or homicidal ideation.  She did not want to  pursue the antidepressant but she was agreeable to follow up on  an  outpatient basis.   DISCHARGE DIAGNOSES:  AXIS I:  Alcohol dependence.  Depressive disorder not  otherwise specified.  AXIS II:  No diagnosis.  AXIS III:  Status post right breast mastectomy, hypertension, hypokalemia,  corrected.  AXIS IV:  Moderate.  AXIS V:  GAF upon discharge 50.   DISCHARGE MEDICATIONS:  1.  Flagyl 250 mg, 1 three times a day.  2.  Hydrochlorothiazide 25 mg per day.  3.  K-Dur 40 mEq daily.   FOLLOW UP:  ADS and the [facility].      Geoffery Lyons, M.D.  Electronically Signed     IL/MEDQ  D:  04/03/2005  T:  04/03/2005  Job:  [id number]

## 2010-12-30 NOTE — H&P (Signed)
[name], [name]               ACCOUNT NO.:  [id number]   MEDICAL RECORD NO.:  [id number]          PATIENT TYPE:  INP   LOCATION:  1825                         FACILITY:  [facility]   PHYSICIAN:  Kela Millin, M.D.DATE OF BIRTH:  12-05-1960   DATE OF ADMISSION:  11/28/2004  DATE OF DISCHARGE:                                HISTORY & PHYSICAL   PRIMARY CARE PHYSICIAN:  Unassigned.   CHIEF COMPLAINT:  Falls and more confusion than usual after drinking  alcohol, per family.   HISTORY OF PRESENT ILLNESS:  The patient is a 50 year old black female with  past medical history significant for alcohol abuse, hypertension, who  presents with the above complaints.  At the time I saw the patient in the  ER, she was sedated after receiving Ativan and so was unable to give a  history.  It is obtained from ER staff and chart reviewed.  Per ER staff,  the family stated that the patient had been falling and also appeared more  confused today than is usual for her after drinking alcohol.  Family also  reported that the patient drinks alcohol daily.  No reports of nausea,  vomiting, or chest pain, abdominal pain, diarrhea, fever, cough,  hematemesis, nor melena.   The patient was seen in the ER and per ER staff, she was alert initially and  moving all extremities.  Her lab work was done and it revealed an alcohol  level of 452 with a potassium of 2.7.  She is admitted to the [facility] service for further evaluation and management.  The patient  received Ativan in the ER following the evaluation discussed above and was  sedated, unable to give a history at the time I saw her.   PAST MEDICAL HISTORY:  1.  As stated above.  2.  Question cancer per chart.   MEDICATIONS:  Unable to obtain.   ALLERGIES:  No known drug allergies per chart.   SOCIAL HISTORY:  Positive for alcohol; otherwise unable to obtain.   FAMILY HISTORY:  Unable to obtain.   REVIEW OF SYSTEMS:  As per HPI.   Otherwise unable to obtain.   PHYSICAL EXAMINATION:  GENERAL:  The patient is a middle-aged black female,  sedated, in no acute respiratory distress.  VITAL SIGNS:  Temperature 97.1, blood pressure 141/100, pulse 90,  respiratory rate 14, O2 saturation 96%.  HEENT:  PERRL.  EOMI.  Slightly dry mucous membranes.  Sclerae anicteric.  NECK:  Supple.  No adenopathy.  No thyromegaly.  No JVD appreciated.  LUNGS:  Clear to auscultation bilaterally.  No rhonchi or wheezes  appreciated.  CARDIOVASCULAR:  Regular rate and rhythm.  Normal S1 and S2.  ABDOMEN:  Soft.  Bowel sounds present.  Nontender.  Nondistended.  No  organomegaly.  No masses palpable.  EXTREMITIES:  No cyanosis or edema.  NEUROLOGIC:  Sedated.  Not following commands.  Unable to adequately assess.   LABORATORY DATA:  CT scan of head:  No acute intracranial findings, atrophy.  Chest x-ray:  No acute findings.  Sodium is 147, potassium 2.7, chloride  109, CO2 26, glucose 89, BUN 6, creatinine 0.4, and calcium is 7.7.  Her  albumin is 2.7, AST 100, ALT 23, alk phos 107, total bilirubin is 1.  Her  alcohol level is 452.  White cell count is 3.6, hemoglobin 11.7, hematocrit  33.9, platelet count 209.   ASSESSMENT/PLAN:  1.  Alcohol intoxication.  Supportive care, IV fluids, thiamine,      multivitamins, folic acid.  Will recheck alcohol level in the a.m.  The      patient currently sedated, status post Ativan so will give Ativan p.r.n.      only for __________  and consider changing to Ativan protocol as      appropriate.  Consider psychiatric consultation in the a.m.   1.  Hypokalemia.  Likely secondary to poor intake.  Will check magnesium      level.  Replace potassium.   1.  Hypertension.  Clonidine patches for now.  The patient unable to give      home medicines at this time.   1.  Question cancer.  Unable to obtain any further information from the      patient at this time __________  .  Follow and obtain more  information      when the patient is more alert or from family.      ACV/MEDQ  D:  11/29/2004  T:  11/29/2004  Job:  [id number]

## 2010-12-30 NOTE — H&P (Signed)
NAMESHARIKA, [name]               ACCOUNT NO.:  [id number]   MEDICAL RECORD NO.:  [id number]          PATIENT TYPE:  INP   LOCATION:  1825                         FACILITY:  [facility]   PHYSICIAN:  Kela Millin, M.D.DATE OF BIRTH:  1961/01/29   DATE OF ADMISSION:  11/28/2004  DATE OF DISCHARGE:                                HISTORY & PHYSICAL   PRIMARY CARE PHYSICIAN:  Unassigned.   CHIEF COMPLAINT:  Falls and more confused than usual after drinking, per  family.   HISTORY OF PRESENT ILLNESS:  The patient is a 50 year old black female with  past medical history significant for alcohol abuse, hypertension   Dictation stops here.      ACV/MEDQ  D:  11/29/2004  T:  11/29/2004  Job:  [id number]

## 2011-05-29 LAB — POCT PREGNANCY, URINE: Preg Test, Ur: NEGATIVE

## 2011-05-29 LAB — BASIC METABOLIC PANEL
BUN: 15
Calcium: 8.1 — ABNORMAL LOW
GFR calc non Af Amer: >60
Glucose, Bld: 97
Potassium: 2.9 — ABNORMAL LOW

## 2011-05-29 LAB — CBC
HCT: 29.9 — ABNORMAL LOW
Platelets: 132 — ABNORMAL LOW
RDW: 17 — ABNORMAL HIGH

## 2011-05-29 LAB — RAPID URINE DRUG SCREEN, HOSP PERFORMED
Amphetamines: NOT DETECTED
Barbiturates: NOT DETECTED
Cocaine: NOT DETECTED
Opiates: NOT DETECTED
Tetrahydrocannabinol: NOT DETECTED

## 2011-05-29 LAB — DIFFERENTIAL
Basophils Absolute: 0
Basophils Relative: 1
Eosinophils Absolute: 0
Eosinophils Relative: 0
Lymphocytes Relative: 15

## 2011-05-29 LAB — URINALYSIS, ROUTINE W REFLEX MICROSCOPIC
Leukocytes, UA: NEGATIVE
Nitrite: NEGATIVE
Specific Gravity, Urine: 1.025
Urobilinogen, UA: 2 — ABNORMAL HIGH
pH: 6

## 2011-05-29 LAB — URINE MICROSCOPIC-ADD ON

## 2011-07-15 ENCOUNTER — Emergency Department (HOSPITAL_COMMUNITY): Payer: Self-pay

## 2011-07-15 ENCOUNTER — Emergency Department (HOSPITAL_COMMUNITY)
Admission: EM | Admit: 2011-07-15 | Discharge: 2011-07-16 | Disposition: A | Discharge status: Home / Self Care | Payer: Self-pay | Attending: Emergency Medicine | Admitting: Emergency Medicine

## 2011-07-15 ENCOUNTER — Encounter (HOSPITAL_COMMUNITY): Payer: Self-pay | Admitting: Emergency Medicine

## 2011-07-15 DIAGNOSIS — R45851 Suicidal ideations: Secondary | ICD-10-CM | POA: Insufficient documentation

## 2011-07-15 DIAGNOSIS — F411 Generalized anxiety disorder: Secondary | ICD-10-CM | POA: Insufficient documentation

## 2011-07-15 DIAGNOSIS — I10 Essential (primary) hypertension: Secondary | ICD-10-CM | POA: Insufficient documentation

## 2011-07-15 DIAGNOSIS — R109 Unspecified abdominal pain: Secondary | ICD-10-CM | POA: Insufficient documentation

## 2011-07-15 DIAGNOSIS — D649 Anemia, unspecified: Secondary | ICD-10-CM | POA: Insufficient documentation

## 2011-07-15 DIAGNOSIS — H9319 Tinnitus, unspecified ear: Secondary | ICD-10-CM | POA: Insufficient documentation

## 2011-07-15 DIAGNOSIS — E876 Hypokalemia: Secondary | ICD-10-CM | POA: Insufficient documentation

## 2011-07-15 DIAGNOSIS — F1010 Alcohol abuse, uncomplicated: Secondary | ICD-10-CM | POA: Insufficient documentation

## 2011-07-15 LAB — COMPREHENSIVE METABOLIC PANEL
ALT: 38 U/L — ABNORMAL HIGH (ref 0–35)
AST: 104 U/L — ABNORMAL HIGH (ref 0–37)
Albumin: 3.5 g/dL (ref 3.5–5.2)
CO2: 25 mEq/L (ref 19–32)
Calcium: 7.7 mg/dL — ABNORMAL LOW (ref 8.4–10.5)
Sodium: 136 mEq/L (ref 135–145)
Total Protein: 8.3 g/dL (ref 6.0–8.3)

## 2011-07-15 LAB — CBC
HCT: 32.2 % — ABNORMAL LOW (ref 36.0–46.0)
Hemoglobin: 11.3 g/dL — ABNORMAL LOW (ref 12.0–15.0)
MCH: 35 pg — ABNORMAL HIGH (ref 26.0–34.0)
MCHC: 35.1 g/dL (ref 30.0–36.0)
MCV: 99.7 fL (ref 78.0–100.0)

## 2011-07-15 LAB — RAPID URINE DRUG SCREEN, HOSP PERFORMED
Amphetamines: NOT DETECTED
Barbiturates: NOT DETECTED
Tetrahydrocannabinol: NOT DETECTED

## 2011-07-15 LAB — POCT PREGNANCY, URINE: Preg Test, Ur: NEGATIVE

## 2011-07-15 LAB — POTASSIUM
Potassium: 2.9 mEq/L — ABNORMAL LOW (ref 3.5–5.1)
Potassium: 3.2 mEq/L — ABNORMAL LOW (ref 3.5–5.1)

## 2011-07-15 MED ORDER — THERA M PLUS PO TABS
1.0000 | ORAL_TABLET | Freq: Every day | ORAL | Status: DC
  Administered 2011-07-15: 1 via ORAL
  Filled 2011-07-15: qty 1

## 2011-07-15 MED ORDER — IBUPROFEN 200 MG PO TABS: 600.0000 mg | ORAL_TABLET | Freq: Three times a day (TID) | ORAL | Status: DC | PRN

## 2011-07-15 MED ORDER — THIAMINE HCL 100 MG/ML IJ SOLN: 100.0000 mg | Freq: Every day | INTRAMUSCULAR | Status: DC

## 2011-07-15 MED ORDER — POTASSIUM CHLORIDE 10 MEQ/100ML IV SOLN: 10.0000 meq | Freq: Once | INTRAVENOUS | Status: DC

## 2011-07-15 MED ORDER — ALUM & MAG HYDROXIDE-SIMETH 200-200-20 MG/5ML PO SUSP: 30.0000 mL | ORAL | Status: DC | PRN

## 2011-07-15 MED ORDER — POTASSIUM CHLORIDE 10 MEQ/100ML IV SOLN
10.0000 meq | Freq: Once | INTRAVENOUS | Status: AC
  Administered 2011-07-15: 10 meq via INTRAVENOUS
  Filled 2011-07-15: qty 100

## 2011-07-15 MED ORDER — MAGNESIUM SULFATE 50 % IJ SOLN: 2.0000 g | Freq: Once | INTRAMUSCULAR | Status: DC

## 2011-07-15 MED ORDER — VITAMIN B-1 100 MG PO TABS
100.0000 mg | ORAL_TABLET | Freq: Every day | ORAL | Status: DC
  Administered 2011-07-15: 100 mg via ORAL
  Filled 2011-07-15: qty 1

## 2011-07-15 MED ORDER — LORAZEPAM 1 MG PO TABS
1.0000 mg | ORAL_TABLET | Freq: Once | ORAL | Status: AC
  Administered 2011-07-15: 1 mg via ORAL
  Filled 2011-07-15: qty 1

## 2011-07-15 MED ORDER — SODIUM CHLORIDE 0.9 % IV SOLN
Freq: Once | INTRAVENOUS | Status: AC
  Administered 2011-07-15: 1000 mL via INTRAVENOUS

## 2011-07-15 MED ORDER — POTASSIUM CHLORIDE CRYS ER 20 MEQ PO TBCR
40.0000 meq | EXTENDED_RELEASE_TABLET | Freq: Once | ORAL | Status: AC
  Administered 2011-07-15: 40 meq via ORAL
  Filled 2011-07-15: qty 2

## 2011-07-15 MED ORDER — POTASSIUM CHLORIDE 20 MEQ/15ML (10%) PO LIQD: 40.0000 meq | Freq: Once | ORAL | Status: DC

## 2011-07-15 MED ORDER — FOLIC ACID 1 MG PO TABS
1.0000 mg | ORAL_TABLET | Freq: Every day | ORAL | Status: DC
  Administered 2011-07-15: 1 mg via ORAL
  Filled 2011-07-15: qty 1

## 2011-07-15 MED ORDER — ZOLPIDEM TARTRATE 5 MG PO TABS: 5.0000 mg | ORAL_TABLET | Freq: Every evening | ORAL | Status: DC | PRN

## 2011-07-15 MED ORDER — MAGNESIUM SULFATE 40 MG/ML IJ SOLN
2.0000 g | INTRAMUSCULAR | Status: AC
  Administered 2011-07-15: 2 g via INTRAVENOUS
  Filled 2011-07-15: qty 50

## 2011-07-15 MED ORDER — LORAZEPAM 1 MG PO TABS: 1.0000 mg | ORAL_TABLET | Freq: Three times a day (TID) | ORAL | Status: DC | PRN

## 2011-07-15 NOTE — BH Assessment (Signed)
Assessment Note   [name] is an 50 y.o. female who presented to ED after brought by brother-in-law for ETOH use and hallucinations.  Pt was assessed this day.  Pt was irritable and had to be asked questions in assessment several times before she answered, stating that there is nothing wrong with her.  Pt stated, "I came here to get an IV."  Pt admits to using alcohol daily for 10+ years.  Pt reported she drinks at least one fifth of liquor and several beers.  Per notes, last use was 1.5 days ago.  However, Clinical research associate called pt's emergency contact (pt's sister, [name]), for collateral information and it appears pt's last drink was yesterday.  Per sister, sister's husband brought pt to ED due to pt having a knife, walking around stating she was protecting herself and that someone was after her (hallucinating) after drinking an unknown quantity of alcohol.  Pt's sister is unsure of how much she has been drinking because pt no longer lives with her, but lives with a friend.  Pt denies previous treatment for alcohol, but sister reports pt has had detox 3-4 times and has been to rehab once 3-4 years ago.  Per sister, pt refuses to stay for rehab once detoxed.  Pt denies SI/HI or psychosis.  However, per EDP note and per nurse, pt admitted to [facility] with plan to take pills.  Pt denies this.  Pt denies being on any medications.  Pt denies any drug use other than ETOH.  Consulted with EDP Rancour who ordered a telepsych based on conflicting information from pt and pt's family/ED staff.  Completed assessment and telepsych paperwork.  Completed assessment notification and faxed to [facility] to log.  Axis I: Alcohol Dependence, Depressive Disorder NOS Axis II: Deferred Axis III:  Past Medical History  Diagnosis Date  . Hypertension   . Alcoholic hepatitis   . Alcoholism    Axis IV: problems related to legal system/crime and problems with primary support group Axis V: 31-40 impairment in reality testing  Past Medical  History:  Past Medical History  Diagnosis Date  . Hypertension   . Alcoholic hepatitis   . Alcoholism     History reviewed. No pertinent past surgical history.  Family History: History reviewed. No pertinent family history.  Social History:  reports that she has been smoking Cigarettes.  She does not have any smokeless tobacco history on file. She reports that she drinks about 2.4 ounces of alcohol per week. She reports that she uses illicit drugs about 7 times per week.  Allergies: No Known Allergies  Home Medications:  Medications Prior to Admission  Medication Dose Route Frequency Provider Last Rate Last Dose  . 0.9 %  sodium chloride infusion   Intravenous Once Shaaron Adler, [place] 50 mL/hr at 07/15/11 1206 1,000 mL at 07/15/11 1206  . alum & mag hydroxide-simeth (MAALOX/MYLANTA) 200-200-20 MG/5ML suspension 30 mL  30 mL Oral PRN Shaaron Adler, PA      . folic acid (FOLVITE) tablet 1 mg  1 mg Oral Daily Elwyn Reach [place], [place]      . ibuprofen (ADVIL,MOTRIN) tablet 600 mg  600 mg Oral Q8H PRN Shaaron Adler, [place]      . LORazepam (ATIVAN) tablet 1 mg  1 mg Oral Once Shaaron Adler, PA   1 mg at 07/15/11 1020  . LORazepam (ATIVAN) tablet 1 mg  1 mg Oral Q8H PRN Shaaron Adler, [place]      .  magnesium sulfate IVPB 2 g 50 mL  2 g Intravenous To Minor Dickey Gave, PHARMD   2 g at 07/15/11 1358  . multivitamins ther. w/minerals tablet 1 tablet  1 tablet Oral Daily Elwyn Reach [place], [place]      . potassium chloride 10 mEq in 100 mL IVPB  10 mEq Intravenous Once Shaaron Adler, PA   10 mEq at 07/15/11 1206  . potassium chloride 10 mEq in 100 mL IVPB  10 mEq Intravenous Once Shaaron Adler, PA   10 mEq at 07/15/11 1453  . potassium chloride 10 mEq in 100 mL IVPB  10 mEq Intravenous Once Glynn Octave, MD      . potassium chloride SA (K-DUR,KLOR-CON) CR tablet 40 mEq  40 mEq Oral Once Raeford Razor, MD   40 mEq at  07/15/11 1130  . thiamine (VITAMIN B-1) tablet 100 mg  100 mg Oral Daily Shaaron Adler, [place]       Or  . thiamine (B-1) injection 100 mg  100 mg Intravenous Daily Shaaron Adler, [place]      . zolpidem (AMBIEN) tablet 5 mg  5 mg Oral QHS PRN Shaaron Adler, PA      . DISCONTD: magnesium sulfate 50 % injection 2 g  2 g Intravenous Once Shaaron Adler, [place]      . DISCONTD: potassium chloride 20 MEQ/15ML (10%) liquid 40 mEq  40 mEq Oral Once Shaaron Adler, [place]       No current outpatient prescriptions on file as of 07/15/2011.    OB/GYN Status:  No LMP recorded. Patient is not currently having periods (Reason: Other).  General Assessment Data Assessment Number: 1  Living Arrangements: Friends Can pt return to current living arrangement?: Yes Admission Status: Voluntary Is patient capable of signing voluntary admission?: Yes Transfer from: Acute Hospital Referral Source: Self/Family/Friend  Risk to self Suicidal Ideation: No-Not Currently/Within Last 6 Months (Currently denies, told EDP/nurse had SI with plan) Suicidal Intent: No-Not Currently/Within Last 6 Months (Currently denies, told EDP/nurse had SI with plan ) Is patient at risk for suicide?: Yes (told nurse had SI with plan to take pills) Suicidal Plan?: No-Not Currently/Within Last 6 Months (Told nurse had SI with plan to take pills) Access to Means: No What has been your use of drugs/alcohol within the last 12 months?: Daily use of alcohol 10 + years Other Self Harm Risks: n/a Triggers for Past Attempts: Other (Comment) (n/a) Intentional Self Injurious Behavior: None Factors that decrease suicide risk: Positive social support Family Suicide History: Unable to assess (pt would not answer) Recent stressful life event(s): Other (Comment) (UTA - pt stated that nothing is wrong with her) Persecutory voices/beliefs?: No Depression: Yes (denies) Depression Symptoms: Despondent;Insomnia;Feeling  angry/irritable;Loss of interest in usual pleasures Substance abuse history and/or treatment for substance abuse?: No (denies) Suicide prevention information given to non-admitted patients: Not applicable  Risk to Others Homicidal Ideation: No-Not Currently/Within Last 6 Months Thoughts of Harm to Others: No-Not Currently Present/Within Last 6 Months Current Homicidal Intent: No-Not Currently/Within Last 6 Months Current Homicidal Plan: No-Not Currently/Within Last 6 Months Access to Homicidal Means: No Identified Victim: n/a History of harm to others?: No Assessment of Violence: None Noted Violent Behavior Description: n/a - pt calm, no violent behavior noted Does patient have access to weapons?: No Criminal Charges Pending?: No Does patient have a court date: No  Mental Status Report Appear/Hygiene: Other (Comment) (Casual) Eye Contact: Poor Motor Activity: Restlessness Speech: Soft;Slow  Level of Consciousness: Quiet/awake;Irritable Mood: Irritable Affect: Depressed Anxiety Level: Minimal Thought Processes: Coherent;Relevant Judgement: Impaired Orientation: Person;Place;Time;Situation Obsessive Compulsive Thoughts/Behaviors: None  Cognitive Functioning Concentration: Normal Memory: Recent Intact;Remote Intact IQ: Average Insight: Fair Impulse Control: Poor Appetite: Fair ("It's 50/50") Weight Loss: 0  Weight Gain: 0  Sleep: Decreased (Pt did not specify) Total Hours of Sleep: 4  (This was a guess by Clinical research associate, pt agreed as would not be specif) Vegetative Symptoms: None  Prior Inpatient/Outpatient Therapy Prior Therapy:  (denies) Prior Therapy Dates:  (denies) Prior Therapy Facilty/Provider(s):  (denies) Reason for Treatment:  (denies)  ADL Screening (condition at time of admission) Independently performs ADLs?: Yes  Home Assistive Devices/Equipment Home Assistive Devices/Equipment: None    Abuse/Neglect Assessment (Assessment to be complete while patient is  alone) Physical Abuse: Denies Verbal Abuse: Denies Sexual Abuse: Denies Exploitation of patient/patient's resources: Denies Self-Neglect: Denies Values / Beliefs Cultural Requests During Hospitalization: None Spiritual Requests During Hospitalization: None Consults Spiritual Care Consult Needed: No Social Work Consult Needed: No Merchant navy officer (For Healthcare) Advance Directive: Patient does not have advance directive;Patient would not like information    Additional Information 1:1 In Past 12 Months?: No CIRT Risk: No Elopement Risk: No Does patient have medical clearance?: Yes     Disposition:  Disposition Disposition of Patient: Other dispositions (Pending telepsych) Other disposition(s): Other (Comment) (Pending telepsych)  On Site Evaluation by:   Reviewed with Physician:     Caryl Comes 07/15/2011 5:08 PM

## 2011-07-15 NOTE — ED Notes (Signed)
Pt sitting up on side of bed after a short nap.  Pt ambulated to the bathroom by sitter.

## 2011-07-15 NOTE — ED Notes (Signed)
Pt resting with out complaints at this time.

## 2011-07-15 NOTE — BH Assessment (Signed)
Writer received call from [facility] staff) at shift change with follow up information regarding patients care and dispositioning. She stated that pt was assessed and pending a telepsych. Pt's disposition would be determined by the telepsych and the EDP, which is Dr. Manus Gunning at this time.   Upon arriving to [facility] met with patients nurse (Tia) to inquire about patients telepsych. She stated that she was "not aware if patients telepsych had been completed." Writer then looked in patients chart and found that patients telepsych was filled out and completed. Pt also had a fax confirmation which meant that pt's information was successfully sent to [facility] (specialist on call) [phone].  Writer then called the [facility] [phone] and inquired about a ETA for the telepsychiatry to meet with the patient so that the consult could be completed. The [facility] stated that she did not have any record of anyone calling to request a telepsych. Writer went forward with completing this step. Per [facility] staff, Dr. Lorelle Gibbs will call [facility] EDP (Dr. Manus Gunning) when he able to consult with the patient.   Writer has made pt's nurse Tia aware of the above information.

## 2011-07-15 NOTE — ED Notes (Signed)
Patient transported to CT 

## 2011-07-15 NOTE — ED Notes (Signed)
Patient is alert and oriented, resp even unlabored. Skin w/d. Sitter with patient. Patient is eating at this time.

## 2011-07-15 NOTE — ED Notes (Signed)
Pt is resting, sitter is at bedside.

## 2011-07-15 NOTE — ED Notes (Signed)
Pt lunch has been ordered.  ?

## 2011-07-15 NOTE — ED Notes (Signed)
Returned from CT.

## 2011-07-15 NOTE — ED Notes (Signed)
Lab tech at bedside for blood draw at this time. 

## 2011-07-15 NOTE — ED Notes (Signed)
Pt reports that she is here for hallucinations and that her family brought her here but she is not sure why. When questioned about alcohol use, pt admits to alcohol use but does is not seeking detox. Pt reports that she sees red and blue colors. Pt reports suicidal thoughts at times, but denies at present.

## 2011-07-15 NOTE — ED Notes (Signed)
Assumed care of pt and pt now resides in Rm 32.  Sitter is at bedside and pt is calm and cooperative with her family at bedside.  Pt eating a [place] sandwhich at this time and a diet tray will be ordered per pts request.  Pt is alert and oriented x4 and denies pain at this time.

## 2011-07-15 NOTE — ED Notes (Signed)
Neomia Dear - [phone] brother in law Orinda Kenner - [phone] sister

## 2011-07-15 NOTE — ED Provider Notes (Signed)
Medical screening examination/treatment/procedure(s) were performed by non-physician practitioner and as supervising physician I was immediately available for consultation/collaboration.  Angelice Piech, MD 07/15/11 1652 

## 2011-07-15 NOTE — ED Provider Notes (Signed)
History     CSN: [id number] Arrival date & time: 07/15/2011  8:39 AM   First MD Initiated Contact with Patient 07/15/11 0902      Chief Complaint  Patient presents with  . Hallucinations  . Alcohol Problem    (Consider location/radiation/quality/duration/timing/severity/associated sxs/prior treatment) Patient is a 50 y.o. female presenting with alcohol problem. The history is provided by the patient.  Alcohol Problem This is a chronic problem. The current episode started more than 1 year ago. The problem occurs daily. The problem has been unchanged. Associated symptoms include abdominal pain. Pertinent negatives include no chest pain, chills, congestion, coughing, fever, headaches, nausea, neck pain, numbness, rash, urinary symptoms, visual change, vomiting or weakness. Associated symptoms comments: Upper abdominal "soreness", which the patient feels is a muscle strain.   the patient reports daily alcohol use. She drinks in upwards of 5 beers a day in addition to about one half of a fifth of vodka per day. Her last alcohol intake was 1-1/2 days ago. Although initial triage no reports patient does not want detox, she does report to me that she would like detox from alcohol. He has been through alcohol detox in the past but does not remember when or what facility she was in. She denies ever having a seizure while detoxing from alcohol. She also admits to suicidal ideation but denies any plan to kill herself and denies any prior history of suicide attempt. He denies any homicidal ideation. She also denies any hallucinations, hearing voices, or visual disturbance.  Past Medical History  Diagnosis Date  . Hypertension   . Alcoholic hepatitis   . Alcoholism     History reviewed. No pertinent past surgical history.  History reviewed. No pertinent family history.  History  Substance Use Topics  . Smoking status: Current Some Day Smoker    Types: Cigarettes    Last Attempt to Quit:  11/02/2000  . Smokeless tobacco: Not on file  . Alcohol Use: Yes     Review of Systems  Constitutional: Negative for fever, chills and appetite change.  HENT: Positive for tinnitus. Negative for ear pain, congestion, trouble swallowing, neck pain and neck stiffness.   Eyes: Negative for pain and visual disturbance.  Respiratory: Negative for cough, chest tightness and shortness of breath.   Cardiovascular: Negative for chest pain and leg swelling.  Gastrointestinal: Positive for abdominal pain. Negative for nausea, vomiting, diarrhea and constipation.  Genitourinary: Negative for dysuria, hematuria, flank pain and vaginal discharge.  Musculoskeletal: Negative for back pain and gait problem.  Skin: Negative for rash and wound.  Neurological: Negative for weakness, numbness and headaches.  Psychiatric/Behavioral: Positive for suicidal ideas. Negative for hallucinations and behavioral problems. The patient is nervous/anxious.     Allergies  Review of patient's allergies indicates no known allergies.  Home Medications  No current outpatient prescriptions on file.  BP 132/84  Pulse 92  Temp(Src) 98.7 F (37.1 C) (Oral)  Resp 18  SpO2 100%  Physical Exam  Constitutional: She is oriented to person, place, and time. She appears well-developed and well-nourished. No distress.  HENT:  Head: Normocephalic and atraumatic.  Right Ear: External ear normal.  Left Ear: External ear normal.  Nose: Nose normal.  Mouth/Throat: Oropharynx is clear and moist. No oropharyngeal exudate.  Eyes: Conjunctivae and EOM are normal. Pupils are equal, round, and reactive to light. No scleral icterus.  Neck: Normal range of motion. Neck supple.  Cardiovascular: Normal rate, regular rhythm, normal heart sounds and intact distal pulses.  Pulmonary/Chest: Effort normal and breath sounds normal. No respiratory distress. She exhibits no tenderness.  Abdominal: Soft. Bowel sounds are normal. She exhibits no  distension and no mass. There is no tenderness. There is no guarding.  Musculoskeletal: Normal range of motion. She exhibits no edema and no tenderness.  Lymphadenopathy:    She has no cervical adenopathy.  Neurological: She is alert and oriented to person, place, and time. No cranial nerve deficit. Coordination normal.  Skin: Skin is warm and dry. No rash noted.  Psychiatric: Her mood appears anxious. Thought content is not paranoid. She expresses suicidal ideation. She expresses no homicidal ideation. She expresses no suicidal plans and no homicidal plans.    ED Course  Procedures (including critical care time)  Labs Reviewed  COMPREHENSIVE METABOLIC PANEL - Abnormal; Notable for the following:    Potassium 2.5 (*)    Chloride 89 (*)    Glucose, Bld 65 (*)    Calcium 7.7 (*)    AST 104 (*)    ALT 38 (*)    GFR calc non Af Amer 83 (*)    All other components within normal limits  CBC - Abnormal; Notable for the following:    RBC 3.23 (*)    Hemoglobin 11.3 (*)    HCT 32.2 (*)    MCH 35.0 (*)    RDW 20.0 (*)    Platelets 103 (*) PLATELET COUNT CONFIRMED BY SMEAR   All other components within normal limits  POTASSIUM - Abnormal; Notable for the following:    Potassium 2.9 (*)    All other components within normal limits  URINE RAPID DRUG SCREEN (HOSP PERFORMED)  ETHANOL  POCT PREGNANCY, URINE  POCT PREGNANCY, URINE   No results found.   Date: 07/15/2011  Rate: 84  Rhythm: sinus  QRS Axis: left  Intervals: QT prolonged  ST/T Wave abnormalities: normal  Conduction Disutrbances:left anterior fascicular block  Narrative Interpretation:   Old EKG Reviewed: changes noted- new QT prolongation, other abnormalities unchanged    1. Alcohol abuse   2. Hypokalemia   3. Anemia       MDM  Patient with suicidal ideation requesting alcohol detox. She has significant hypokalemia. I have ordered potassium supplementation and we'll recheck this site with the hope that she can  be admitted to a psychiatric facility. I will consult the behavioral team for assessment. Pt does not appear tremulous and given her alcohol level of less than 11 at this time, I do not suspect that she will begin to go into alcohol withdrawal.        Shaaron Adler, PA 07/15/11 1545

## 2011-07-16 MED ORDER — POTASSIUM CHLORIDE CRYS ER 20 MEQ PO TBCR
40.0000 meq | EXTENDED_RELEASE_TABLET | Freq: Once | ORAL | Status: AC
  Administered 2011-07-16: 40 meq via ORAL
  Filled 2011-07-16: qty 2

## 2011-07-16 NOTE — ED Notes (Signed)
Signed out from [facility].  Alcohol abuse with suicidal ideation.  Hypokalemia has been replaced.  Patient now denying SI to ACT worker.  Family expressed concern that patient was walking around with a knife to protect her from her hallucinations.  Patient also expressed SI to nurses here.  Will involve telepsych given conflicting information.  D./w Dr. Henderson Cloud.  Glynn Octave, MD 07/16/11 [phone]

## 2011-07-16 NOTE — ED Provider Notes (Signed)
Spoke with Dr.Horvath, telepsychiatrist, states patient is safe for release to home at 2 AM patient is alert Glasgow Coma Score 15 she is no longer hallucinating. She denies intent to harm self or others and feels safe for discharge.. She realizes that she may have an alcohol problem but declines acute treatment. Plan referral for outpatient counseling for alcohol abuse Diagnoses #1 visual hallucinations #2 chronic alcohol abuse #3 hypokalemia  Doug Sou, MD 07/16/11 [phone]

## 2011-07-24 ENCOUNTER — Encounter: Payer: Self-pay | Admitting: Family Medicine

## 2011-07-24 ENCOUNTER — Ambulatory Visit (INDEPENDENT_AMBULATORY_CARE_PROVIDER_SITE_OTHER): Payer: Self-pay | Admitting: Family Medicine

## 2011-07-24 VITALS — BP 180/110 | Temp 98.7°F | Ht 67.0 in | Wt 110.0 lb

## 2011-07-24 DIAGNOSIS — F39 Unspecified mood [affective] disorder: Secondary | ICD-10-CM | POA: Insufficient documentation

## 2011-07-24 DIAGNOSIS — F322 Major depressive disorder, single episode, severe without psychotic features: Secondary | ICD-10-CM

## 2011-07-24 DIAGNOSIS — I10 Essential (primary) hypertension: Secondary | ICD-10-CM

## 2011-07-24 MED ORDER — SERTRALINE HCL 25 MG PO TABS: 25.0000 mg | ORAL_TABLET | Freq: Every day | ORAL | Status: DC

## 2011-07-24 NOTE — Assessment & Plan Note (Signed)
Blood pressure elevated. We'll address this at the next visit.

## 2011-07-24 NOTE — Patient Instructions (Signed)
Thank you for coming in today. I think you have depression Please start taking zoloft (sertaline) daily. It may take a week or two to really start working.  Make an appointment with me (Dr. Denyse Amass) On Monday.  If you feel like hurting your self or others go to the ER, or call the office or the national suicide hotline at  [phone]  ?National Suicide Prevention Lifeline   Please call Dr. Pascal Lux (number at front dest) to get an appointment or info on where you can get counseling.  Call the [facility] to see about counseling.  M[facility] Services Operated by [facility] [address] [place], [place] [address] [phone] view map Crisis/Emergency Services Open 24 Hours/ 7 Days a Week Operated by [employer] [address] [place], [place] [address] [phone] view map

## 2011-07-24 NOTE — Assessment & Plan Note (Signed)
Maj. depression today.  Will initiate Zoloft 25 mg and contract for safety we'll also provide patient with information on suicide prevention hotline. We'll followup in clinic in one week.

## 2011-07-24 NOTE — Progress Notes (Signed)
[name] presents to clinic today to discuss depression. She's noted that she's felt anxious and depressed over the last 10 years however it worsened over the past few months. She notes daily feeling sad anhedonia and anxiety. A. PHQ9 today scored 26. Mood disorder questionnaire is negative for bipolar. She does describe some suicidal ideation but denies any active plan and agrees to contract for safety today. She denies any past history of depression or family history of mood problem.  PMH reviewed. Social history: 4 drinks per week ROS as above otherwise neg Medications reviewed. Current Outpatient Prescriptions  Medication Sig Dispense Refill  . sertraline (ZOLOFT) 25 MG tablet Take 1 tablet (25 mg total) by mouth daily.  30 tablet  0    Exam:  BP 180/110  Temp(Src) 98.7 F (37.1 C) (Oral)  Ht 5\' 7"  (1.702 m)  Wt 110 lb (49.896 kg)  BMI 17.23 kg/m2 Gen: Well NAD MOOD: Eye contact good. Affect slightly flat speech is normal in the process is linear and goal-directed. No delusions or hallucinations expressed no homicidal ideation. Patient has a passive death wish but has no plan and agrees to contract for safety today.

## 2011-07-25 LAB — TSH: TSH: 0.625 u[IU]/mL (ref 0.350–4.500)

## 2011-07-31 ENCOUNTER — Ambulatory Visit: Payer: Self-pay | Admitting: Family Medicine

## 2011-08-02 ENCOUNTER — Ambulatory Visit (INDEPENDENT_AMBULATORY_CARE_PROVIDER_SITE_OTHER): Payer: Self-pay | Admitting: Family Medicine

## 2011-08-02 ENCOUNTER — Encounter: Payer: Self-pay | Admitting: Family Medicine

## 2011-08-02 ENCOUNTER — Encounter: Payer: Self-pay | Admitting: Home Health Services

## 2011-08-02 DIAGNOSIS — F1020 Alcohol dependence, uncomplicated: Secondary | ICD-10-CM

## 2011-08-02 DIAGNOSIS — I10 Essential (primary) hypertension: Secondary | ICD-10-CM

## 2011-08-02 DIAGNOSIS — F10239 Alcohol dependence with withdrawal, unspecified: Secondary | ICD-10-CM

## 2011-08-02 DIAGNOSIS — F322 Major depressive disorder, single episode, severe without psychotic features: Secondary | ICD-10-CM

## 2011-08-02 LAB — BASIC METABOLIC PANEL
CO2: 25 mEq/L (ref 19–32)
Calcium: 8.5 mg/dL (ref 8.4–10.5)
Creat: 0.65 mg/dL (ref 0.50–1.10)

## 2011-08-02 LAB — MAGNESIUM: Magnesium: 0.8 mg/dL — ABNORMAL LOW (ref 1.5–2.5)

## 2011-08-02 MED ORDER — SERTRALINE HCL 25 MG PO TABS: 25.0000 mg | ORAL_TABLET | Freq: Every day | ORAL | Status: DC

## 2011-08-02 MED ORDER — LISINOPRIL 10 MG PO TABS: 10.0000 mg | ORAL_TABLET | Freq: Every day | ORAL | Status: DC

## 2011-08-02 NOTE — Patient Instructions (Signed)
Thank you for coming in today. I will have you talk with our Child psychotherapist.  Try to get those medicines.  Come back Friday if you can.  I recommend that you go to the hospital. But I understand if you cannot do it.  If you get worse shaking or feel awful or have a seizure go directly to the Emergency room.  Come back in more than 1 week.  Keep your phone on. I will call you with your results.

## 2011-08-03 ENCOUNTER — Encounter: Payer: Self-pay | Admitting: Family Medicine

## 2011-08-03 DIAGNOSIS — F10239 Alcohol dependence with withdrawal, unspecified: Secondary | ICD-10-CM | POA: Insufficient documentation

## 2011-08-03 NOTE — Progress Notes (Signed)
[name] presents to clinic to f/u her anxiety and depression, HTN, and alcohol use.   In the interim she did not fill the zoloft due to cost. She remains quite anxious.  She stated that the zoloft cost $90 at [facility]. She did not try to call any other pharmacies. Additionally she notes that she will have some money in 2 weeks. She denies any SI/HI.   HTN: History of elevated BP in the past.  Was on HCTZ. No CP, SOB on exertion, palpitations, orthopnea headache, vision change or edema. Feels well.   Alcohol use. Was drinking 1 pint of vodka daily until 2 days ago. She quit cold [place]. She notes feeling shaky. Additionally she denies any seizures or history of severe alcohol withdrawal in the past. She has someone at home who can help. She does not want to go to the hospital.   PMH reviewed.  ROS as above otherwise neg Medications reviewed. Current Outpatient Prescriptions  Medication Sig Dispense Refill  . lisinopril (PRINIVIL,ZESTRIL) 10 MG tablet Take 1 tablet (10 mg total) by mouth daily.  30 tablet  1  . sertraline (ZOLOFT) 25 MG tablet Take 1 tablet (25 mg total) by mouth daily.  30 tablet  0    Exam:  BP 191/120  Pulse 106  Ht 5\' 7"  (1.702 m)  Wt 109 lb (49.442 kg)  BMI 17.07 kg/m2 Gen: NAD HEENT: EOMI,  MMM Lungs: CTABL Nl WOB Heart: RRR no MRG Abd: NABS, NT, ND Exts: Non edematous BL  LE, warm and well perfused. Neuro: Tremor at rest and hand extension present.  Psych: AOx3 Mood and affect normal. Speech linear and oal directed. No SI/HI. No delusions or hallucinations.

## 2011-08-03 NOTE — Assessment & Plan Note (Signed)
Comorbid with alcohol abuse. Will encourage her to fill Zoloft. Gave her money from the indigent fund. Additionally referred her to the MAP program. Will f/u in 1 week. Red flags and suicide precautions reviewed.

## 2011-08-03 NOTE — Assessment & Plan Note (Signed)
I am very concerned.  She is going through WD. I would prefer to manage this issue in the hospital however [name] refuses to go.  I will follow her closely as an outpatient. We discussed warning signs for worsening alcohol withdrawal. She expressed understanding.

## 2011-08-03 NOTE — Assessment & Plan Note (Signed)
BP elevated 2 visits now. Will start lisinopril.. No HCTZ currently as she has a history of low K. Will f/u in 2 days. BMP + Mag today.

## 2011-08-04 ENCOUNTER — Ambulatory Visit (INDEPENDENT_AMBULATORY_CARE_PROVIDER_SITE_OTHER): Payer: Self-pay | Admitting: Family Medicine

## 2011-08-04 ENCOUNTER — Telehealth: Payer: Self-pay | Admitting: Clinical

## 2011-08-04 ENCOUNTER — Telehealth: Payer: Self-pay | Admitting: Family Medicine

## 2011-08-04 ENCOUNTER — Encounter: Payer: Self-pay | Admitting: Family Medicine

## 2011-08-04 DIAGNOSIS — F322 Major depressive disorder, single episode, severe without psychotic features: Secondary | ICD-10-CM

## 2011-08-04 DIAGNOSIS — F10239 Alcohol dependence with withdrawal, unspecified: Secondary | ICD-10-CM

## 2011-08-04 DIAGNOSIS — I10 Essential (primary) hypertension: Secondary | ICD-10-CM

## 2011-08-04 DIAGNOSIS — F1020 Alcohol dependence, uncomplicated: Secondary | ICD-10-CM

## 2011-08-04 MED ORDER — MAGNESIUM 30 MG PO TABS: 30.0000 mg | ORAL_TABLET | Freq: Two times a day (BID) | ORAL | Status: DC

## 2011-08-04 NOTE — Progress Notes (Signed)
[name] is a 50 year old woman who presents to clinic today to followup alcohol withdrawal, hypertension, depression/anxiety.  She was last in clinic 2 days ago  1) hypertension: Was started on lisinopril 10 mg. Feels well no syncope chest pain palpitations dyspnea on exertion or edema.  Feels well.  2) alcohol withdrawal: Started drinking again, currently on 2 units of alcohol per day. No longer shaking or feeling extremely anxious.    3) depression/anxiety:  The field and started taking the Zoloft. Has only been taking it for 2 days. Does not feel much different. No suicidal or homicidal ideation.  4) hypomagnesemia: Was discovered to have a magnesium of 0.9 on Wednesday.  Prescription for Mag-Ox I called in. Patient hasn't started taking it yet. No palpitations.  PMH reviewed.  ROS as above otherwise neg Medications reviewed. Current Outpatient Prescriptions  Medication Sig Dispense Refill  . lisinopril (PRINIVIL,ZESTRIL) 10 MG tablet Take 1 tablet (10 mg total) by mouth daily.  30 tablet  1  . magnesium 30 MG tablet Take 1 tablet (30 mg total) by mouth 2 (two) times daily.  60 tablet  2  . sertraline (ZOLOFT) 25 MG tablet Take 1 tablet (25 mg total) by mouth daily.  30 tablet  0    Exam:  BP 112/80  Pulse 100  Temp(Src) 98.3 F (36.8 C) (Oral)  Ht 5\' 7"  (1.702 m)  Wt 104 lb (47.174 kg)  BMI 16.29 kg/m2 Gen: Well NAD HEENT: EOMI,  MMM Lungs: CTABL Nl WOB Heart: RRR no MRG Abd: NABS, NT, ND Exts: Non edematous BL  LE, warm and well perfused.  Neuro: No tremors Psych: Alert and oriented x3 normal mood and affect inattention. Thought process is linear and goal-directed no suicidal or homicidal ideation.

## 2011-08-04 NOTE — Patient Instructions (Signed)
Thank you for coming in today. No more than 1 alcohol drink per day.  Slowly stop drinking (1 drink every other day for a week). Call AA.  Come back in 1.5 weeks (new year) for a recheck.  Start taking the magnesium I prescribed.  If you feel like hurting yourself or others let me know.

## 2011-08-04 NOTE — Assessment & Plan Note (Signed)
Blood pressure at goal today. This is likely due to a combination of restarting alcohol intake and taking lisinopril. We'll followup in 1.5 weeks with repeat blood pressure measurement and repeat basic metabolic panel.

## 2011-08-04 NOTE — Telephone Encounter (Signed)
Clinical Child psychotherapist (CSW) received referral to see if CSW could provide support as pt has a history of substance abuse and has recently stopped cold [place]. CSW left a message for pt.  Theresia Bough, MSW, Theresia Majors [phone]

## 2011-08-04 NOTE — Telephone Encounter (Signed)
Called and left a message about low mag level. Wrote for [employer] tablets. Will f/u today.

## 2011-08-04 NOTE — Assessment & Plan Note (Signed)
Restarted lower level of alcohol intake stopping alcohol withdrawal symptoms currently currently feeling well. I recommend a gradual taper of alcohol use. We'll follow patient up in 1.5 weeks. We'll attempt at this visit to get her into Alcoholics Anonymous when she is stopped drinking.

## 2011-08-06 ENCOUNTER — Encounter: Payer: Self-pay | Admitting: Family Medicine

## 2011-08-06 NOTE — Assessment & Plan Note (Signed)
Mag 0.9 on Wednesday. Rx for mag supplementation. Will check in 1 week,.

## 2011-08-06 NOTE — Assessment & Plan Note (Signed)
Doing well. On zoloft 25. Will f/u in 1 week. No SI/HI. Crisis procedure reviewed with patient.

## 2011-08-17 ENCOUNTER — Encounter: Payer: Self-pay | Admitting: Family Medicine

## 2011-08-17 ENCOUNTER — Ambulatory Visit (INDEPENDENT_AMBULATORY_CARE_PROVIDER_SITE_OTHER): Payer: Self-pay | Admitting: Family Medicine

## 2011-08-17 DIAGNOSIS — F322 Major depressive disorder, single episode, severe without psychotic features: Secondary | ICD-10-CM

## 2011-08-17 DIAGNOSIS — F1010 Alcohol abuse, uncomplicated: Secondary | ICD-10-CM

## 2011-08-17 DIAGNOSIS — I10 Essential (primary) hypertension: Secondary | ICD-10-CM

## 2011-08-17 NOTE — Patient Instructions (Signed)
Thank you for coming in today. Fill that magnesium pill and start taking it twice a day.  When you run out of Serteline the new prescription will be 50.  See me in 1 month.  Think about a quit date for alcohol.

## 2011-08-17 NOTE — Progress Notes (Signed)
Ms. Fahrner is a 51 year old woman who presents to clinic to followup her blood pressure, mood, alcohol intake.  Blood pressure: Currently taking lisinopril 10 mg doing well. Denies any chest pain palpitations dyspnea syncope edema.   Mood: Doing well much better than previously. No suicidal or homicidal ideation. Currently taking sertraline 25 mg daily.  Alcohol intake: Currently drinking one unit of alcohol a day. Very much wants to stop alcohol but is worried about the withdrawal symptoms she experienced 2 weeks ago.  She is agreeable to setting a quit date sometime in the near future.  PMH reviewed.  ROS as above otherwise neg Medications reviewed. Current Outpatient Prescriptions  Medication Sig Dispense Refill  . lisinopril (PRINIVIL,ZESTRIL) 10 MG tablet Take 1 tablet (10 mg total) by mouth daily.  30 tablet  1  . sertraline (ZOLOFT) 25 MG tablet Take 1 tablet (25 mg total) by mouth daily.  30 tablet  0  . magnesium 30 MG tablet Take 1 tablet (30 mg total) by mouth 2 (two) times daily.  60 tablet  2    Exam:  BP 124/80  Temp(Src) 98.4 F (36.9 C) (Oral)  Ht 5\' 7"  (1.702 m)  Wt 112 lb (50.803 kg)  BMI 17.54 kg/m2 Gen: Well NAD HEENT: EOMI,  MMM Lungs: CTABL Nl WOB Heart: RRR no MRG Abd: NABS, NT, ND Exts: Non edematous BL  LE, warm and well perfused.  Psych: Alert and oriented x3 process is linear and goal directed no delusions or hallucinations expressed no suicidal or homicidal ideation.

## 2011-08-18 ENCOUNTER — Other Ambulatory Visit: Payer: Self-pay | Admitting: Family Medicine

## 2011-08-18 DIAGNOSIS — F322 Major depressive disorder, single episode, severe without psychotic features: Secondary | ICD-10-CM

## 2011-08-18 MED ORDER — SERTRALINE HCL 50 MG PO TABS: 50.0000 mg | ORAL_TABLET | Freq: Every day | ORAL | Status: DC

## 2011-08-18 NOTE — Assessment & Plan Note (Signed)
Currently having one drink a day which prevents her from alcohol withdrawal.  Plan to set a quit date and start benzodiazepine taper some point in the next month or so.  We'll do this in conjunction with Alcoholics Anonymous.  We'll followup

## 2011-08-18 NOTE — Assessment & Plan Note (Signed)
Mood seems well-controlled. Currently on Zoloft 25. Plan to increase to 50 mg at the next prescription bottle.

## 2011-08-18 NOTE — Assessment & Plan Note (Signed)
Not yet taking a medium pills. Encouraged her to start taking them will complete one month, and recheck magnesium at that point.

## 2011-08-18 NOTE — Assessment & Plan Note (Signed)
Elevated blood pressure is now well controlled lisinopril 10.  Plan to followup in one month.

## 2011-08-22 ENCOUNTER — Telehealth: Payer: Self-pay | Admitting: Home Health Services

## 2011-08-22 NOTE — Telephone Encounter (Signed)
Left message for [name] to follow up with substance abuse dependence and over all health.  Left phone number for her to call back.

## 2011-08-22 NOTE — Telephone Encounter (Signed)
LM to follow up with how she is doing.

## 2011-08-31 ENCOUNTER — Other Ambulatory Visit: Payer: Self-pay | Admitting: Family Medicine

## 2011-09-01 ENCOUNTER — Telehealth: Payer: Self-pay | Admitting: Family Medicine

## 2011-09-01 DIAGNOSIS — I10 Essential (primary) hypertension: Secondary | ICD-10-CM

## 2011-09-01 DIAGNOSIS — F322 Major depressive disorder, single episode, severe without psychotic features: Secondary | ICD-10-CM

## 2011-09-01 NOTE — Telephone Encounter (Signed)
Will fwd. To Dr.Corey .Airen Stiehl  

## 2011-09-01 NOTE — Telephone Encounter (Signed)
Pt needs refill on Lisinopril and Zoloft.

## 2011-09-05 MED ORDER — SERTRALINE HCL 50 MG PO TABS: 50.0000 mg | ORAL_TABLET | Freq: Every day | ORAL | Status: DC

## 2011-09-05 MED ORDER — LISINOPRIL 10 MG PO TABS: 10.0000 mg | ORAL_TABLET | Freq: Every day | ORAL | Status: DC

## 2011-09-05 NOTE — Telephone Encounter (Signed)
completed

## 2011-09-07 ENCOUNTER — Telehealth: Payer: Self-pay | Admitting: Home Health Services

## 2011-09-07 NOTE — Telephone Encounter (Signed)
Left message for Rosine to follow up with substance abuse dependence and over all health.  Left phone number for her to call back.  

## 2011-09-18 ENCOUNTER — Encounter: Payer: Self-pay | Admitting: Family Medicine

## 2011-09-18 ENCOUNTER — Ambulatory Visit (INDEPENDENT_AMBULATORY_CARE_PROVIDER_SITE_OTHER): Payer: Self-pay | Admitting: Family Medicine

## 2011-09-18 DIAGNOSIS — F322 Major depressive disorder, single episode, severe without psychotic features: Secondary | ICD-10-CM

## 2011-09-18 DIAGNOSIS — F1010 Alcohol abuse, uncomplicated: Secondary | ICD-10-CM

## 2011-09-18 DIAGNOSIS — I10 Essential (primary) hypertension: Secondary | ICD-10-CM

## 2011-09-18 NOTE — Progress Notes (Addendum)
[name] is a 51 y.o. female who presents to [facility] today for   1) hypertension:  [name] is taking her lisinopril daily.  She feels well denies any chest pain palpitations dyspnea.  She says that she has continued drinking but had an excessive amount of alcohol intake last night.  She feels well.  2) alcohol: As above normal he drinks one or 2 glasses of vodka to contain about 3 fingers each night. She had more last night.  She wants to quit alcohol this month and will set a quit date for the next few weeks. She knows were alcoholics anonymous is in this area.    3) mood: Taking Zoloft feels okay he is sleeping a little more than usual denies any suicidal or homicidal ideation.    PMH reviewed. Using alcohol heavily as noted above ROS as above otherwise neg Medications reviewed. Current Outpatient Prescriptions  Medication Sig Dispense Refill  . lisinopril (PRINIVIL,ZESTRIL) 10 MG tablet Take 1 tablet (10 mg total) by mouth daily.  30 tablet  3  . sertraline (ZOLOFT) 50 MG tablet Take 1 tablet (50 mg total) by mouth daily.  30 tablet  3  . magnesium 30 MG tablet Take 1 tablet (30 mg total) by mouth 2 (two) times daily.  60 tablet  2    Exam:  BP 167/102  Pulse 99  Ht 5\' 7"  (1.702 m)  Wt 112 lb (50.803 kg)  BMI 17.54 kg/m2 Gen: Well NAD HEENT: EOMI,  MMM Lungs: CTABL Nl WOB Heart: RRR no MRG Abd: NABS, NT, ND Exts: Non edematous BL  LE, warm and well perfused.  Neuro: Mild tremor in hand extension bilaterally. Psych: Alert and oriented x3 speech affect eye contact are normal. No suicidal or homicidal ideation no delusions or hallucinations expressed.

## 2011-09-18 NOTE — Assessment & Plan Note (Signed)
Heavy alcohol use with history of withdrawal without seizure.  Plan to have a quit date in the next few weeks and will return to clinic for benzodiazepine taper.

## 2011-09-18 NOTE — Patient Instructions (Signed)
Thank you for coming in today. Your blood pressure is a bit high today. Measure it at home and write the numbers down in your folder. Let me know if the top number is over 160 or the bottom number is over 100.  Keep taking your lisinopril. As the pharmacy for a refill 1 week before you run out.  For alcohol set a quit date this month and come back on the that day (1-2 weeks from now).  We will recheck your blood pressure and start a medicine to prevent bad alcohol withdrawal. Keep taking your Sertraline for depression.

## 2011-09-18 NOTE — Assessment & Plan Note (Signed)
Blood pressure is elevated today however it has been elevated and normal on the same dose in the past. I suspect she has a component of alcohol withdrawal which is causing hypertension. Plan to recheck her blood pressure in one to 2 weeks and adjust blood pressure medicine as needed at that point.

## 2011-09-18 NOTE — Assessment & Plan Note (Signed)
Well currently with Zoloft 50. Will continue this dose and withdrawal from alcohol and then increase in the next few weeks

## 2011-10-02 ENCOUNTER — Ambulatory Visit: Payer: Self-pay | Admitting: Family Medicine

## 2011-10-12 ENCOUNTER — Ambulatory Visit (INDEPENDENT_AMBULATORY_CARE_PROVIDER_SITE_OTHER): Payer: Self-pay | Admitting: Family Medicine

## 2011-10-12 ENCOUNTER — Encounter: Payer: Self-pay | Admitting: Family Medicine

## 2011-10-12 ENCOUNTER — Ambulatory Visit: Payer: Self-pay | Admitting: Family Medicine

## 2011-10-12 DIAGNOSIS — F10239 Alcohol dependence with withdrawal, unspecified: Secondary | ICD-10-CM

## 2011-10-12 DIAGNOSIS — F322 Major depressive disorder, single episode, severe without psychotic features: Secondary | ICD-10-CM

## 2011-10-12 DIAGNOSIS — I10 Essential (primary) hypertension: Secondary | ICD-10-CM

## 2011-10-12 DIAGNOSIS — F10939 Alcohol use, unspecified with withdrawal, unspecified: Secondary | ICD-10-CM

## 2011-10-12 MED ORDER — LISINOPRIL 10 MG PO TABS: 10.0000 mg | ORAL_TABLET | Freq: Every day | ORAL | Status: DC

## 2011-10-12 MED ORDER — SERTRALINE HCL 50 MG PO TABS: 50.0000 mg | ORAL_TABLET | Freq: Every day | ORAL | Status: DC

## 2011-10-12 NOTE — Assessment & Plan Note (Signed)
I think the patient is undergoing alcohol withdrawal now which is evident by her severe hypertension and tremor.  I explicitly stated that it is unsafe to remain outside of the hospital during severe alcohol withdrawal and recommend strongly that she presented to the hospital.  She will think about it. If she gets worse she'll go to the emergency room.  Followup as soon as possible with me

## 2011-10-12 NOTE — Assessment & Plan Note (Signed)
Major depression off of Zoloft. Plan to restart Zoloft as soon as possible.  Passive death wish today no active plan. Contract for safety verbally. Discuss suicidal precautions such as calling 911 or go into the hospital

## 2011-10-12 NOTE — Patient Instructions (Signed)
Thank you for coming in today. I think going to the hospital for alcohol withdrawal is the best idea.  If you get worse go to the emergency room.  Please fill your blood pressure pills and you depression pills.  See me next week.  Please consider strongly my offer for the hospital. I think it will be for a few days.

## 2011-10-12 NOTE — Progress Notes (Signed)
Patient ID: [name], female   DOB: July 12, 1961, 51 y.o.   MRN: [id number] [name] is a 51 y.o. female who presents to [facility] today for hypertension and mood at followup.  1) hypertension: Stop taking lisinopril when she ran out last week. No chest pain palpitations headache dyspnea or edema.  Will not be able to afford to fill her medicines until Friday or Monday when she gets paid.  2) depression: Ran out of Zoloft last week as well.  Feels a bit better than she has been. PHQ9 is 16 today with passive death wish but no plan. She agrees to contract verbally for safety.   3) alcohol withdrawal: Did not drink last week and had 2 units of alcohol last night. She is jittery but feels well.     PMH reviewed. Significant for hypertension depression and alcohol abuse ROS as above otherwise neg Medications reviewed. Current Outpatient Prescriptions  Medication Sig Dispense Refill  . lisinopril (PRINIVIL,ZESTRIL) 10 MG tablet Take 1 tablet (10 mg total) by mouth daily.  30 tablet  3  . magnesium 30 MG tablet Take 1 tablet (30 mg total) by mouth 2 (two) times daily.  60 tablet  2  . sertraline (ZOLOFT) 50 MG tablet Take 1 tablet (50 mg total) by mouth daily.  30 tablet  3  . DISCONTD: lisinopril (PRINIVIL,ZESTRIL) 10 MG tablet Take 1 tablet (10 mg total) by mouth daily.  30 tablet  3  . DISCONTD: sertraline (ZOLOFT) 50 MG tablet Take 1 tablet (50 mg total) by mouth daily.  30 tablet  3    Exam:  BP 182/120  Pulse 96  Ht 5\' 7"  (1.702 m)  Wt 113 lb (51.256 kg)  BMI 17.70 kg/m2 Gen: Well NAD, jittery appearing HEENT: EOMI,  MMM,  Lungs: CTABL Nl WOB Heart: RRR no MRG Abd: NABS, NT, ND Exts: Non edematous BL  LE, warm and well perfused.  Neuro: Mild tremor in hand extension.  Psych: Normal affect normal speech and thought process. No homicidal ideation. Passive death wish but no plan.

## 2011-10-12 NOTE — Assessment & Plan Note (Signed)
Plan to restart lisinopril as soon as possible. I do think a component of her severe hypertension today is due to alcohol withdrawal. Carefully discussed warning signs such as chest pain or trouble breathing with patient. She expresses understanding

## 2011-10-20 ENCOUNTER — Ambulatory Visit: Payer: Self-pay | Admitting: Family Medicine

## 2011-10-30 ENCOUNTER — Encounter: Payer: Self-pay | Admitting: Family Medicine

## 2011-10-30 ENCOUNTER — Ambulatory Visit (INDEPENDENT_AMBULATORY_CARE_PROVIDER_SITE_OTHER): Payer: Self-pay | Admitting: Family Medicine

## 2011-10-30 VITALS — BP 149/108 | HR 93 | Temp 98.4°F | Ht 67.0 in

## 2011-10-30 DIAGNOSIS — F322 Major depressive disorder, single episode, severe without psychotic features: Secondary | ICD-10-CM

## 2011-10-30 DIAGNOSIS — I10 Essential (primary) hypertension: Secondary | ICD-10-CM

## 2011-10-30 DIAGNOSIS — F1010 Alcohol abuse, uncomplicated: Secondary | ICD-10-CM

## 2011-10-30 MED ORDER — LISINOPRIL-HYDROCHLOROTHIAZIDE 10-12.5 MG PO TABS: 1.0000 | ORAL_TABLET | Freq: Every day | ORAL | Status: DC

## 2011-10-30 NOTE — Patient Instructions (Signed)
Thank you for coming in today. Congratulations on quitting drinking.  Please put aside the old blood pressure pills and get the new ones.  The new pills contain hydrochlorothiazide and Lisinopril.  Take it daily.  Continue your depression medicine.  Please go to AA daily if possible. It is free.  See me in 2-3 weeks for a well woman exam with pap smear.   Call to schedule your mammogram.

## 2011-10-30 NOTE — Assessment & Plan Note (Signed)
PHQ9 is 16 without any SI/HI.  This is a moderate improvement on Zoloft 50.  Plan to continue Zoloft 50 and followup in 2-3 weeks. If still doing well we'll increase to 100 mg. Discussed warning signs with patient. She expresses understanding.

## 2011-10-30 NOTE — Assessment & Plan Note (Signed)
Blood pressure not well-controlled today on lisinopril 10. Her blood pressure has been high in the last few visits. Plan to add hydrochlorothiazide and followup in 2-3 weeks.

## 2011-10-30 NOTE — Assessment & Plan Note (Signed)
[name] quit drinking!  She has been abstinent for one week.  She does not display any signs of withdrawal currently.  She has not yet been down alcohol is an on as however she does know where and when they are.  She agreed to attend meetings this week and followup with me in 2-3 weeks .

## 2011-10-30 NOTE — Progress Notes (Signed)
[name] is a 51 y.o. female who presents to [facility] today for   1) hypertension: Taking lisinopril 10. Feels well without any chest pains palpitations swelling dyspnea cough or syncope.  She does not measure her home blood pressure.    2) alcoholism: Quit drinking one week ago.  Has not yet gone to Alcoholics Anonymous.  However she does know where and when the meetings are.  She is nervous about going. She feels good about quitting without any jitteriness or anxiety.  3) depression: Taking Zoloft. No suicidality or homicidality. PHQ9 is 16 today.  Feels well without any fatigue jitteriness.  PMH reviewed. Significant for past history of breast cancer of the right breast status post lumpectomy chemotherapy and x-ray therapy.  This is previously unknown until this visit ROS as above otherwise neg Medications reviewed. Current Outpatient Prescriptions  Medication Sig Dispense Refill  . lisinopril-hydrochlorothiazide (PRINZIDE,ZESTORETIC) 10-12.5 MG per tablet Take 1 tablet by mouth daily.  90 tablet  3  . sertraline (ZOLOFT) 50 MG tablet Take 1 tablet (50 mg total) by mouth daily.  30 tablet  3    Exam:  BP 149/108  Pulse 93  Temp(Src) 98.4 F (36.9 C) (Oral)  Ht 5\' 7"  (1.702 m) Gen: Well NAD, thin. HEENT: EOMI,  MMM Lungs: CTABL Nl WOB Heart: RRR no MRG Abd: NABS, NT, ND Exts: Non edematous BL  LE, warm and well perfused.  Psych: Alert and oriented, mildly depressed affect with good eye contact. Speech and thought process are normal and linear. No delusions or hallucinations expressed. No suicidality or homicidality.   No tremor

## 2011-11-09 ENCOUNTER — Other Ambulatory Visit: Payer: Self-pay | Admitting: Family Medicine

## 2011-11-09 DIAGNOSIS — Z1231 Encounter for screening mammogram for malignant neoplasm of breast: Secondary | ICD-10-CM

## 2011-11-14 ENCOUNTER — Ambulatory Visit
Admission: RE | Admit: 2011-11-14 | Discharge: 2011-11-14 | Disposition: A | Discharge status: Home / Self Care | Payer: Self-pay | Source: Ambulatory Visit | Attending: Family Medicine | Admitting: Family Medicine

## 2011-11-14 DIAGNOSIS — Z1231 Encounter for screening mammogram for malignant neoplasm of breast: Secondary | ICD-10-CM

## 2011-11-20 ENCOUNTER — Ambulatory Visit: Payer: Self-pay | Admitting: Family Medicine

## 2011-11-22 ENCOUNTER — Ambulatory Visit: Payer: Self-pay | Admitting: Family Medicine

## 2011-11-25 ENCOUNTER — Emergency Department (HOSPITAL_COMMUNITY): Payer: Self-pay

## 2011-11-25 ENCOUNTER — Emergency Department (HOSPITAL_COMMUNITY)
Admission: EM | Admit: 2011-11-25 | Discharge: 2011-11-25 | Disposition: A | Discharge status: Home / Self Care | Payer: Self-pay | Attending: Emergency Medicine | Admitting: Emergency Medicine

## 2011-11-25 ENCOUNTER — Encounter (HOSPITAL_COMMUNITY): Payer: Self-pay | Admitting: Emergency Medicine

## 2011-11-25 DIAGNOSIS — Z853 Personal history of malignant neoplasm of breast: Secondary | ICD-10-CM | POA: Insufficient documentation

## 2011-11-25 DIAGNOSIS — I10 Essential (primary) hypertension: Secondary | ICD-10-CM | POA: Insufficient documentation

## 2011-11-25 DIAGNOSIS — K7010 Alcoholic hepatitis without ascites: Secondary | ICD-10-CM | POA: Insufficient documentation

## 2011-11-25 DIAGNOSIS — R55 Syncope and collapse: Secondary | ICD-10-CM | POA: Insufficient documentation

## 2011-11-25 DIAGNOSIS — E876 Hypokalemia: Secondary | ICD-10-CM | POA: Insufficient documentation

## 2011-11-25 LAB — URINE MICROSCOPIC-ADD ON

## 2011-11-25 LAB — URINALYSIS, ROUTINE W REFLEX MICROSCOPIC
Ketones, ur: 15 mg/dL — AB
Nitrite: NEGATIVE
Specific Gravity, Urine: 1.02 (ref 1.005–1.030)
Urobilinogen, UA: 1 mg/dL (ref 0.0–1.0)

## 2011-11-25 LAB — DIFFERENTIAL
Basophils Absolute: 0 10*3/uL (ref 0.0–0.1)
Basophils Relative: 0 % (ref 0–1)
Eosinophils Absolute: 0 10*3/uL (ref 0.0–0.7)
Monocytes Relative: 7 % (ref 3–12)
Neutrophils Relative %: 84 % — ABNORMAL HIGH (ref 43–77)

## 2011-11-25 LAB — CBC
Hemoglobin: 11 g/dL — ABNORMAL LOW (ref 12.0–15.0)
MCH: 35.9 pg — ABNORMAL HIGH (ref 26.0–34.0)
MCHC: 34.9 g/dL (ref 30.0–36.0)
Platelets: 105 10*3/uL — ABNORMAL LOW (ref 150–400)
RDW: 15.6 % — ABNORMAL HIGH (ref 11.5–15.5)

## 2011-11-25 LAB — BASIC METABOLIC PANEL
BUN: 10 mg/dL (ref 6–23)
Creatinine, Ser: 0.96 mg/dL (ref 0.50–1.10)
GFR calc Af Amer: 80 mL/min — ABNORMAL LOW (ref >90)
GFR calc non Af Amer: 69 mL/min — ABNORMAL LOW (ref >90)
Potassium: 2.8 mEq/L — ABNORMAL LOW (ref 3.5–5.1)

## 2011-11-25 LAB — RAPID URINE DRUG SCREEN, HOSP PERFORMED
Barbiturates: NOT DETECTED
Tetrahydrocannabinol: NOT DETECTED

## 2011-11-25 MED ORDER — POTASSIUM CHLORIDE CRYS ER 20 MEQ PO TBCR: 40.0000 meq | EXTENDED_RELEASE_TABLET | Freq: Once | ORAL | Status: DC

## 2011-11-25 MED ORDER — SODIUM CHLORIDE 0.9 % IV SOLN
INTRAVENOUS | Status: DC
  Administered 2011-11-25 (×2): via INTRAVENOUS

## 2011-11-25 MED ORDER — POTASSIUM CHLORIDE CRYS ER 20 MEQ PO TBCR
40.0000 meq | EXTENDED_RELEASE_TABLET | Freq: Once | ORAL | Status: AC
  Administered 2011-11-25: 40 meq via ORAL
  Filled 2011-11-25: qty 2

## 2011-11-25 MED ORDER — POTASSIUM CHLORIDE CRYS ER 20 MEQ PO TBCR
20.0000 meq | EXTENDED_RELEASE_TABLET | Freq: Two times a day (BID) | ORAL | Status: DC
Start: 2011-11-25 — End: 2012-06-10

## 2011-11-25 NOTE — ED Notes (Signed)
Pt found on bedroom floor by boyfriend at 1830. Boyfriend last talked to pt at 51. Upon arrival of EMS pt was confused. During transport pt became more oriented.

## 2011-11-25 NOTE — ED Provider Notes (Signed)
History     CSN: [id number]  Arrival date & time 11/25/11  1849   First MD Initiated Contact with Patient 11/25/11 1913      Chief Complaint  Patient presents with  . Altered Mental Status    (Consider location/radiation/quality/duration/timing/severity/associated sxs/prior treatment) HPI Comments: [name] is a 51 y.o. Female brought in by EMS. The patient's significant other found her sprawled on the floor,  he could not arouse her so he called EMS because he thought she had had a stroke. EMS arrived, and found her alert, but confused There is no documented seizure. In the emergency department. The patient is alert and oriented and able to give history. She states that she does not remember what happened just awoke in the ambulance. She reports feeling depressed recently, but not suicidal. She has had a decreased appetite for a few days. She wanted to talk to me alone because she stated her significant other makes her nervous. She denies domestic violence. The significant other, occasionally makes her mad when he talks in an angry voice. She has no other recent illnesses. She states that she has been taking her usual medication. She has never had a seizure.  Patient is a 51 y.o. female presenting with altered mental status. The history is provided by the patient and the spouse.  Altered Mental Status    Past Medical History  Diagnosis Date  . Hypertension   . Alcoholic hepatitis   . Alcoholism   . Breast cancer 2001    Rt side. Had lumpectomy, chemo and XRT by [facility]    Past Surgical History  Procedure Date  . Rt lumpectomy 2001    History reviewed. No pertinent family history.  History  Substance Use Topics  . Smoking status: Former Smoker    Types: Cigarettes    Quit date: 11/02/2000  . Smokeless tobacco: Not on file  . Alcohol Use: No    OB History    Grav Para Term Preterm Abortions TAB SAB Ect Mult Living                  Review of Systems    Psychiatric/Behavioral: Positive for altered mental status.  All other systems reviewed and are negative.    Allergies  Review of patient's allergies indicates no known allergies.  Home Medications   Current Outpatient Rx  Name Route Sig Dispense Refill  . LISINOPRIL-HYDROCHLOROTHIAZIDE 10-12.5 MG PO TABS Oral Take 1 tablet by mouth daily. 90 tablet 3  . SERTRALINE HCL 50 MG PO TABS Oral Take 1 tablet (50 mg total) by mouth daily. 30 tablet 3  . POTASSIUM CHLORIDE CRYS ER 20 MEQ PO TBCR Oral Take 1 tablet (20 mEq total) by mouth 2 (two) times daily. 30 tablet 0    BP 161/115  Pulse 96  Temp(Src) 98.8 F (37.1 C) (Oral)  Resp 18  SpO2 98%  Physical Exam  Nursing note and vitals reviewed. Constitutional: She is oriented to person, place, and time. She appears well-developed.       She is underweight  HENT:  Head: Normocephalic and atraumatic.       There is no tongue abrasion  Eyes: Conjunctivae and EOM are normal. Pupils are equal, round, and reactive to light.  Neck: Normal range of motion and phonation normal. Neck supple.  Cardiovascular: Normal rate, regular rhythm and intact distal pulses.   Pulmonary/Chest: Effort normal and breath sounds normal. She exhibits no tenderness.  Abdominal: Soft. She exhibits no distension.  There is no tenderness. There is no guarding.  Musculoskeletal: Normal range of motion.  Neurological: She is alert and oriented to person, place, and time. She has normal strength. No cranial nerve deficit. She exhibits normal muscle tone. Coordination normal.  Skin: Skin is warm and dry.  Psychiatric: Her behavior is normal. Judgment and thought content normal.       She appears depressed    ED Course  Procedures (including critical care time)    Date: 11/25/2011  Rate: 117  Rhythm: sinus tachycardia  QRS Axis: normal  Intervals: normal  ST/T Wave abnormalities: normal  Conduction Disutrbances:left anterior fascicular block  Narrative  Interpretation:   Old EKG Reviewed: QT shorter     Labs Reviewed  CBC - Abnormal; Notable for the following:    RBC 3.06 (*)    Hemoglobin 11.0 (*)    HCT 31.5 (*)    MCV 102.9 (*)    MCH 35.9 (*)    RDW 15.6 (*)    Platelets 105 (*) PLATELET COUNT CONFIRMED BY SMEAR   All other components within normal limits  DIFFERENTIAL - Abnormal; Notable for the following:    Neutrophils Relative 84 (*)    Lymphocytes Relative 10 (*)    All other components within normal limits  BASIC METABOLIC PANEL - Abnormal; Notable for the following:    Potassium 2.8 (*)    Chloride 94 (*)    Glucose, Bld 133 (*)    GFR calc non Af Amer 69 (*)    GFR calc Af Amer 80 (*)    All other components within normal limits  URINALYSIS, ROUTINE W REFLEX MICROSCOPIC - Abnormal; Notable for the following:    Color, Urine AMBER (*) BIOCHEMICALS MAY BE AFFECTED BY COLOR   APPearance CLOUDY (*)    Hgb urine dipstick SMALL (*)    Bilirubin Urine MODERATE (*)    Ketones, ur 15 (*)    Protein, ur >300 (*)    Leukocytes, UA TRACE (*)    All other components within normal limits  URINE MICROSCOPIC-ADD ON - Abnormal; Notable for the following:    Squamous Epithelial / LPF FEW (*)    Bacteria, UA FEW (*)    Casts HYALINE CASTS (*)    All other components within normal limits  ETHANOL  URINE RAPID DRUG SCREEN (HOSP PERFORMED)   Dg Chest 2 View  11/25/2011  *RADIOLOGY REPORT*  Clinical Data: Syncope.  Seizures.  Hypertension.  CHEST - 2 VIEW  Comparison: 10/14/2010  Findings: Heart size is normal.  Both lungs are clear.  No evidence of pleural effusion.  No mass or lymphadenopathy identified.  Mild right upper lobe scarring noted.  Surgical clips again seen in the right axilla.  IMPRESSION: Stable exam.  No active disease.  Original Report Authenticated By: Danae Orleans, M.D.   Ct Head Wo Contrast  11/25/2011  *RADIOLOGY REPORT*  Clinical Data: Altered mental status  CT HEAD WITHOUT CONTRAST  Technique:   Contiguous axial images were obtained from the base of the skull through the vertex without contrast.  Comparison: 07/15/2011  Findings: No evidence of parenchymal hemorrhage or extra-axial fluid collection. No mass lesion, mass effect, or midline shift.  No CT evidence of acute infarction.  Subcortical white matter and periventricular small vessel ischemic changes.  Global cortical atrophy.  Ventricular prominence.  The visualized paranasal sinuses are essentially clear. The mastoid air cells are unopacified.  No evidence of calvarial fracture.  IMPRESSION: No evidence of acute  intracranial abnormality.  Atrophy with small vessel ischemic changes, advanced for age.  Original Report Authenticated By: Charline Bills, M.D.     1. Hypokalemia   2. Syncope       MDM  Syncope, without clear cause. She has hypokalemia, however, her EKG is reassuring. Her QT interval had been longer but is now almost  normal. I doubt cardiac arrhythmia causing syncope. No evidence for serious metabolic abnormality, suspected occult infection or CVA. Patient, stable for discharge with outpatient evaluation by her PCP.         Flint Melter, MD 11/25/11 [phone]

## 2011-11-25 NOTE — ED Notes (Signed)
PT attempting to give urine sample; appears shaky; seizure pads in place; pt on monitor.

## 2011-11-25 NOTE — ED Notes (Signed)
PT ambulated with a steady gait; VSS; A&Ox3; no signs of distress; respirations even and unlabored; skin warm and dry. No questions at thsi time.

## 2011-11-25 NOTE — ED Notes (Signed)
PT returned from scans; back on monitor.

## 2011-11-25 NOTE — ED Notes (Signed)
Patient transported to CT then x-ray 

## 2011-11-25 NOTE — Discharge Instructions (Signed)
Get plenty of rest. Eat foods high in potassium. Eat 3 regular meals each day and drink a lot of fluids. Call your doctor for followup appointment            Foods Rich in Potassium Food / Potassium (mg)  Apricots, dried,  cup / 378 mg   Apricots, raw, 1 cup halves / 401 mg   Avocado,  / 487 mg   Banana, 1 large / 487 mg   Beef, lean, round, 3 oz / 202 mg   Cantaloupe, 1 cup cubes / 427 mg   Dates, medjool, 5 whole / 835 mg   Ham, cured, 3 oz / 212 mg   Lentils, dried,  cup / 458 mg   Lima beans, frozen,  cup / 258 mg   Orange, 1 large / 333 mg   Orange juice, 1 cup / 443 mg   Peaches, dried,  cup / 398 mg   Peas, split, cooked,  cup / 355 mg   Potato, boiled, 1 medium / 515 mg   Prunes, dried, uncooked,  cup / 318 mg   Raisins,  cup / 309 mg   Salmon, pink, raw, 3 oz / 275 mg   Sardines, canned , 3 oz / 338 mg   Tomato, raw, 1 medium / 292 mg   Tomato juice, 6 oz / 417 mg   [place], 3 oz / 349 mg  Document Released: 07/31/2005 Document Revised: 04/12/2011 Document Reviewed: 12/14/2008 [facility] Patient Information 2012 [place], [place].Syncope You have had a fainting (syncopal) spell. A fainting episode is a sudden, short-lived loss of consciousness. It results in complete recovery. It occurs because there has been a temporary shortage of oxygen and/or sugar (glucose) to the brain. CAUSES   Blood pressure pills and other medications that may lower blood pressure below normal. Sudden changes in posture (sudden standing).   Over-medication. Take your medications as directed.   Standing too long. This can cause blood to pool in the legs.   Seizure disorders.   Low blood sugar (hypoglycemia) of diabetes. This more commonly causes coma.   Bearing down to go to the bathroom. This can cause your blood pressure to rise suddenly. Your body compensates by making the blood pressure too low when you stop bearing down.   Hardening of the arteries  where the brain temporarily does not receive enough blood.   Irregular heart beat and circulatory problems.   Fear, emotional distress, injury, sight of blood, or illness.  Your caregiver will send you home if the syncope was from non-worrisome causes (benign). Depending on your age and health, you may stay to be monitored and observed. If you return home, have someone stay with you if your caregiver feels that is desirable. It is very important to keep all follow-up referrals and appointments in order to properly manage this condition. This is a serious problem which can lead to serious illness and death if not carefully managed.  WARNING: Do not drive or operate machinery until your caregiver feels that it is safe for you to do so. SEEK IMMEDIATE MEDICAL CARE IF:   You have another fainting episode or faint while lying or sitting down. DO NOT DRIVE YOURSELF. Call 911 if no other help is available.   You have chest pain, are feeling sick to your stomach (nausea), vomiting or abdominal pain.   You have an irregular heartbeat or one that is very fast (pulse over 120 beats per minute).   You have  a loss of feeling in some part of your body or lose movement in your arms or legs.   You have difficulty with speech, confusion, severe weakness, or visual problems.   You become sweaty and/or feel light headed.  Make sure you are rechecked as instructed. Document Released: 07/31/2005 Document Revised: 07/20/2011 Document Reviewed: 03/21/2007 [facility] Patient Information 2012 [place], [place].

## 2011-12-15 ENCOUNTER — Ambulatory Visit (INDEPENDENT_AMBULATORY_CARE_PROVIDER_SITE_OTHER): Payer: Self-pay | Admitting: Family Medicine

## 2011-12-15 VITALS — BP 120/80 | HR 76 | Ht 67.0 in | Wt 105.0 lb

## 2011-12-15 DIAGNOSIS — F322 Major depressive disorder, single episode, severe without psychotic features: Secondary | ICD-10-CM

## 2011-12-15 DIAGNOSIS — IMO0002 Reserved for concepts with insufficient information to code with codable children: Secondary | ICD-10-CM

## 2011-12-15 DIAGNOSIS — I10 Essential (primary) hypertension: Secondary | ICD-10-CM

## 2011-12-15 DIAGNOSIS — E876 Hypokalemia: Secondary | ICD-10-CM

## 2011-12-15 DIAGNOSIS — R209 Unspecified disturbances of skin sensation: Secondary | ICD-10-CM

## 2011-12-15 NOTE — Patient Instructions (Signed)
Thank you for coming in today. Please call AA Tel: [phone] to figure out where and when the meetings are.  Please go get the Potassium pills and take 1 a day.  See me in 2 weeks.  We will try to figure out why you are tingling.

## 2011-12-16 LAB — BASIC METABOLIC PANEL
BUN: 33 mg/dL — ABNORMAL HIGH (ref 6–23)
CO2: 29 mEq/L (ref 19–32)
Calcium: 8.3 mg/dL — ABNORMAL LOW (ref 8.4–10.5)
Creat: 0.89 mg/dL (ref 0.50–1.10)
Glucose, Bld: 76 mg/dL (ref 70–99)

## 2011-12-18 ENCOUNTER — Encounter: Payer: Self-pay | Admitting: Family Medicine

## 2011-12-18 ENCOUNTER — Telehealth: Payer: Self-pay | Admitting: *Deleted

## 2011-12-18 MED ORDER — MAGNESIUM 30 MG PO TABS: 30.0000 mg | ORAL_TABLET | Freq: Two times a day (BID) | ORAL | Status: DC

## 2011-12-18 NOTE — Assessment & Plan Note (Signed)
Hypokalemia likely related to underlying hypomagnesemia.  Plan to check magnesium level today and encourage patient to start oral potassium.  Followup in 2-4 weeks

## 2011-12-18 NOTE — Telephone Encounter (Signed)
Message copied by Jennette Bill on Mon Dec 18, 2011  4:54 PM ------      Message from: Rodolph Bong      Created: Mon Dec 18, 2011  8:39 AM      Regarding: Low mag       Please call pt and let her know the Magnesium is low and she should go to [facility] to pick up the magnesium pills I called in.             Thanks,      Clayburn Pert

## 2011-12-18 NOTE — Progress Notes (Signed)
[name] is a 51 y.o. female who presents to [facility] today for emergency room followup.  This Tessler develop syncope and was seen in the emergency room. She was found to have hypokalemia.  She was discharged with a prescription for potassium tablets which he has not started taking yet. She currently feels well without any palpitations syncope presyncope dizziness edema chest pain or trouble breathing.   Additionally she notes that her anxiety seems to be improved with Zoloft.   Also she notes that she has remained abstinent from alcohol but has not started go to Alcoholics Anonymous yet.  She does note that she is experiencing some numbness and tingling in her extremities over the past several months..   PMH: Reviewed significant for hypertension, depression, history of substance abuse and hypomagnesemia History  Substance Use Topics  . Smoking status: Former Smoker    Types: Cigarettes    Quit date: 11/02/2000  . Smokeless tobacco: Not on file  . Alcohol Use: No   ROS as above  Medications reviewed. Current Outpatient Prescriptions  Medication Sig Dispense Refill  . sertraline (ZOLOFT) 50 MG tablet Take 1 tablet (50 mg total) by mouth daily.  30 tablet  3  . lisinopril-hydrochlorothiazide (PRINZIDE,ZESTORETIC) 10-12.5 MG per tablet Take 1 tablet by mouth daily.  90 tablet  3  . potassium chloride SA (K-DUR,KLOR-CON) 20 MEQ tablet Take 1 tablet (20 mEq total) by mouth 2 (two) times daily.  30 tablet  0    Exam:  BP 120/80  Pulse 76  Ht 5\' 7"  (1.702 m)  Wt 105 lb (47.628 kg)  BMI 16.45 kg/m2 Gen: Well NAD HEENT: EOMI,  MMM Lungs: CTABL Nl WOB Heart: RRR no MRG Abd: NABS, NT, ND Exts: Non edematous BL  LE, warm and well perfused. Negative Tinel's and Phalen's test.    Results for orders placed in visit on 12/15/11 (from the past 72 hour(s))  VITAMIN B12     Status: Normal   Collection Time   12/15/11 12:10 PM      Component Value Range Comment   Vitamin B-12 508  211 - 911  (pg/mL)   BASIC METABOLIC PANEL     Status: Abnormal   Collection Time   12/15/11 12:10 PM      Component Value Range Comment   Sodium 141  135 - 145 (mEq/L)    Potassium 3.3 (*) 3.5 - 5.3 (mEq/L)    Chloride 99  96 - 112 (mEq/L)    CO2 29  19 - 32 (mEq/L)    Glucose, Bld 76  70 - 99 (mg/dL)    BUN 33 (*) 6 - 23 (mg/dL)    Creat 1.61  0.96 - 1.10 (mg/dL)    Calcium 8.3 (*) 8.4 - 10.5 (mg/dL)   MAGNESIUM     Status: Normal   Collection Time   12/15/11 12:10 PM      Component Value Range Comment   Magnesium 1.7  1.5 - 2.5 (mg/dL)

## 2011-12-18 NOTE — Assessment & Plan Note (Signed)
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 Will check this today.

## 2011-12-18 NOTE — Assessment & Plan Note (Signed)
Over the weekend lab results are back showing hypokalemia but also hypomagnesemia.  Plan to restart oral magnesium tablets and followup.

## 2011-12-18 NOTE — Assessment & Plan Note (Signed)
Depression seems to be doing well with Zoloft. Plan to continue this and perhaps increase in about one month.  Warned about suicidal and homicidal precautions. Patient expresses understanding

## 2011-12-18 NOTE — Assessment & Plan Note (Signed)
Currently on lisinopril/hydrochlorothiazide with blood pressure that is reasonably well controlled.  However patient had hypokalemia.  She has not yet started taking her potassium pills.  I encouraged her to resume potassium and will discuss this under the hypokalemia tab. Followup in 2-4 weeks.

## 2011-12-18 NOTE — Telephone Encounter (Signed)
Left message for patient to return call. Please see message from Dr Denyse Amass.[name], Rodena Medin

## 2011-12-19 NOTE — Telephone Encounter (Signed)
Told pt of low Mag level and that she can go p/u the rx at her pharmacy. Pt agreed.[name] [place]

## 2011-12-29 ENCOUNTER — Encounter: Payer: Self-pay | Admitting: Family Medicine

## 2011-12-29 ENCOUNTER — Ambulatory Visit (INDEPENDENT_AMBULATORY_CARE_PROVIDER_SITE_OTHER): Payer: Self-pay | Admitting: Family Medicine

## 2011-12-29 VITALS — BP 144/92 | HR 96 | Temp 98.2°F | Ht 67.0 in | Wt 108.0 lb

## 2011-12-29 DIAGNOSIS — E876 Hypokalemia: Secondary | ICD-10-CM

## 2011-12-29 DIAGNOSIS — H109 Unspecified conjunctivitis: Secondary | ICD-10-CM | POA: Insufficient documentation

## 2011-12-29 DIAGNOSIS — I10 Essential (primary) hypertension: Secondary | ICD-10-CM

## 2011-12-29 MED ORDER — POLYMYXIN B-TRIMETHOPRIM 10000-0.1 UNIT/ML-% OP SOLN: 1.0000 [drp] | OPHTHALMIC | Status: AC

## 2011-12-29 MED ORDER — LISINOPRIL-HYDROCHLOROTHIAZIDE 20-12.5 MG PO TABS: 1.0000 | ORAL_TABLET | Freq: Every day | ORAL | Status: DC

## 2011-12-29 NOTE — Progress Notes (Signed)
[name] is a 51 y.o. female who presents to [facility] today for   1) hypertension: Patient takes hydrochlorothiazide/lisinopril 12.5 x 10.  She feels well without any chest pain palpitations edema or syncope.  2) hypokalemia: Patient was recently hospitalized for hypokalemia. Additionally in the past she has had hypomagnesemia. She thought she bought over-the-counter potassium and magnesium but in fact purchased vitamin B12.  She feels well otherwise.   3) conjunctivitis: Patient has had 2 days of left eye redness associated with some itching. She denies any significant loss of visual acuity. She denies any eye pain. She feels well otherwise without any fevers or chills.   PMH: Reviewed significant for former alcoholic with hypo-magnesium and hypo-potassium.   History  Substance Use Topics  . Smoking status: Former Smoker    Types: Cigarettes    Quit date: 11/02/2000  . Smokeless tobacco: Not on file  . Alcohol Use: No   ROS as above  Medications reviewed. Current Outpatient Prescriptions  Medication Sig Dispense Refill  . lisinopril-hydrochlorothiazide (ZESTORETIC) 20-12.5 MG per tablet Take 1 tablet by mouth daily.  90 tablet  3  . sertraline (ZOLOFT) 50 MG tablet Take 1 tablet (50 mg total) by mouth daily.  30 tablet  3  . magnesium 30 MG tablet Take 1 tablet (30 mg total) by mouth 2 (two) times daily.  60 tablet  1  . potassium chloride SA (K-DUR,KLOR-CON) 20 MEQ tablet Take 1 tablet (20 mEq total) by mouth 2 (two) times daily.  30 tablet  0  . trimethoprim-polymyxin b (POLYTRIM) ophthalmic solution Place 1 drop into the left eye every 4 (four) hours.  10 mL  0    Exam:  BP 144/92  Pulse 96  Temp(Src) 98.2 F (36.8 C) (Oral)  Ht 5\' 7"  (1.702 m)  Wt 108 lb (48.988 kg)  BMI 16.92 kg/m2 Gen: Well NAD HEENT: EOMI,  MMM, conjunctival injection on the lateral aspect of the left eye associated with upper eyelid swelling. No pain with range of motion of the eye. Normal pupillary  light reflex Lungs: CTABL Nl WOB Heart: RRR no MRG Abd: NABS, NT, ND Exts: Non edematous BL  LE, warm and well perfused.   No results found for this or any previous visit (from the past 72 hour(s)).

## 2011-12-29 NOTE — Assessment & Plan Note (Signed)
Patient has what appears to be bacterial conjunctivitis. Plan to use Polytrim eyedrops and followup in 2 weeks. Discussed warning signs or symptoms worsen. Recommend followup sooner if worse

## 2011-12-29 NOTE — Assessment & Plan Note (Signed)
Hypokalemia likely secondary to hypomagnesemia and history of alcoholism.  Patient never did obtain potassium or magnesium pills.  I read this is the importance of taking his medications and will followup in 2 weeks. We'll obtain metabolic panel and magnesium level at that point

## 2011-12-29 NOTE — Assessment & Plan Note (Signed)
Blood pressure less than ideally controlled on lisinopril/hydrochlorothiazide 10 x 12.5. Given her history of hypokalemia plan to increase to lisinopril 20 and hydrochlorothiazide 12.5. Plan to followup in 2 weeks. We'll obtain basic metabolic panel then

## 2011-12-29 NOTE — Patient Instructions (Addendum)
Thank you for coming in today. Blood Pressure: We are increasing the blood pressure medicine. We are going from Lisinopril-HCTZ 10/12.5 to 20/12.5. Start taking the new dose tomorrow.  Potassium; Please get and take the potassium and the magnesium pills. The chemical symbol for Magnesium is Mg Continue the sertaline for mood. For eyes please use the eye drops in your left eye every 4 hours while awake for 1 week.  Let me know if it is getting worse or not getting better.   See me in 2 weeks.

## 2012-01-12 ENCOUNTER — Encounter: Payer: Self-pay | Admitting: Family Medicine

## 2012-01-12 ENCOUNTER — Ambulatory Visit (INDEPENDENT_AMBULATORY_CARE_PROVIDER_SITE_OTHER): Payer: Self-pay | Admitting: Family Medicine

## 2012-01-12 VITALS — BP 110/80 | HR 92 | Temp 97.8°F | Ht 67.0 in | Wt 109.3 lb

## 2012-01-12 DIAGNOSIS — F322 Major depressive disorder, single episode, severe without psychotic features: Secondary | ICD-10-CM

## 2012-01-12 DIAGNOSIS — I10 Essential (primary) hypertension: Secondary | ICD-10-CM

## 2012-01-12 MED ORDER — SERTRALINE HCL 100 MG PO TABS: 100.0000 mg | ORAL_TABLET | Freq: Every day | ORAL | Status: DC

## 2012-01-12 NOTE — Assessment & Plan Note (Signed)
Severe worsening depression.  Currently taking Zoloft 50 mg daily. Agreed to contract for safety and increase Zoloft to 100 mg daily. Plan followup in 2 weeks. Discuss suicidal precautions. Patient expresses understanding.

## 2012-01-12 NOTE — Assessment & Plan Note (Signed)
Plan to continue current regimen and followup in 2 weeks

## 2012-01-12 NOTE — Progress Notes (Signed)
[name] is a 51 y.o. female who presents to [facility] today for   1) hypertension: Currently taking lisinopril/hydrochlorothiazide daily. Doing well without any chest pains or palpitations. Associated with hypertension is hypokalemia and hypomagnesemia. She has not started taking the potassium or magnesium tablets.  She denies any palpitations chest pain syncope or dyspnea.  2) depression: Patient is worsening depression. Her PHQ 9 today is 79 with a passive death wish.  She has no plan to hurt herself or others.  She agrees for contract for safety   PMH: Reviewed significant for hypertension, depression, and former alcohol abuse History  Substance Use Topics  . Smoking status: Former Smoker    Types: Cigarettes    Quit date: 11/02/2000  . Smokeless tobacco: Not on file  . Alcohol Use: No   ROS as above  Medications reviewed. Current Outpatient Prescriptions  Medication Sig Dispense Refill  . lisinopril-hydrochlorothiazide (ZESTORETIC) 20-12.5 MG per tablet Take 1 tablet by mouth daily.  90 tablet  3  . sertraline (ZOLOFT) 100 MG tablet Take 1 tablet (100 mg total) by mouth daily.  30 tablet  3  . magnesium 30 MG tablet Take 1 tablet (30 mg total) by mouth 2 (two) times daily.  60 tablet  1  . potassium chloride SA (K-DUR,KLOR-CON) 20 MEQ tablet Take 1 tablet (20 mEq total) by mouth 2 (two) times daily.  30 tablet  0  . DISCONTD: sertraline (ZOLOFT) 50 MG tablet Take 1 tablet (50 mg total) by mouth daily.  30 tablet  3    Exam:  BP 110/80  Pulse 92  Temp(Src) 97.8 F (36.6 C) (Oral)  Ht 5\' 7"  (1.702 m)  Wt 109 lb 4.8 oz (49.578 kg)  BMI 17.12 kg/m2 Gen: Well , thin woman in no acute distress HEENT: EOMI,  MMM Lungs: CTABL Nl WOB Heart: RRR no MRG Exts: Non edematous BL  LE, warm and well perfused.  Psych: Alert and oriented. Somewhat flattened affect. Speech and thought process are normal. Passive death wish without any plan.   No results found for this or any previous  visit (from the past 72 hour(s)).

## 2012-01-12 NOTE — Patient Instructions (Signed)
Thank you for coming in today. Please start taking sertiline 100mg  a day.  This is 2 of the 50mg  pills.  Please come back in 2 weeks.  Let me know or call 911 or go to the emergency room if you feel like hurting yourself or others.  Try to get those magnesium pills if you can.

## 2012-01-26 ENCOUNTER — Encounter: Payer: Self-pay | Admitting: Family Medicine

## 2012-01-26 ENCOUNTER — Ambulatory Visit (INDEPENDENT_AMBULATORY_CARE_PROVIDER_SITE_OTHER): Payer: Self-pay | Admitting: Family Medicine

## 2012-01-26 VITALS — BP 120/80 | HR 92 | Ht 67.0 in | Wt 103.8 lb

## 2012-01-26 DIAGNOSIS — F322 Major depressive disorder, single episode, severe without psychotic features: Secondary | ICD-10-CM

## 2012-01-26 MED ORDER — VENLAFAXINE HCL 37.5 MG PO TABS: 37.5000 mg | ORAL_TABLET | Freq: Two times a day (BID) | ORAL | Status: DC

## 2012-01-26 NOTE — Progress Notes (Signed)
[name] is a 51 y.o. female who presents to [facility] today for depression.  Patient was seen 2 weeks ago for depression. Then her Zoloft was increased to 100 mg. The patient has not yet increased her Zoloft and continues to be severely depressed. She expresses a passive death wish but no active suicidal plan. She continues to agree for contract for safety.    PMH: Reviewed significant for former alcohol abuse hypertension and depression History  Substance Use Topics  . Smoking status: Former Smoker    Types: Cigarettes    Quit date: 11/02/2000  . Smokeless tobacco: Not on file  . Alcohol Use: No   ROS as above  Medications reviewed. Current Outpatient Prescriptions  Medication Sig Dispense Refill  . lisinopril-hydrochlorothiazide (ZESTORETIC) 20-12.5 MG per tablet Take 1 tablet by mouth daily.  90 tablet  3  . magnesium 30 MG tablet Take 1 tablet (30 mg total) by mouth 2 (two) times daily.  60 tablet  1  . potassium chloride SA (K-DUR,KLOR-CON) 20 MEQ tablet Take 1 tablet (20 mEq total) by mouth 2 (two) times daily.  30 tablet  0  . venlafaxine (EFFEXOR) 37.5 MG tablet Take 1 tablet (37.5 mg total) by mouth 2 (two) times daily.  60 tablet  1    Exam:  BP 120/80  Pulse 92  Ht 5\' 7"  (1.702 m)  Wt 103 lb 12.8 oz (47.083 kg)  BMI 16.26 kg/m2 Gen: Well NAD MOOD: Alert and oriented. Affect is flat and tearful at times. Speech is normal and thought process is linear and goal-directed. Patient has a passive death wish but no active suicidal plan. She has no homicidality  No results found for this or any previous visit (from the past 72 hour(s)).

## 2012-01-26 NOTE — Patient Instructions (Addendum)
Thank you for coming in today. I am worried about you.  Please get the new prescription when you can.  Start taking effexor and stop zoloft.  If you feel like hurting yourself or others please call her, or 911 or go to the hospital.  Come back in one week

## 2012-01-26 NOTE — Assessment & Plan Note (Signed)
I am concerned. She does not have active suicidality but she is higher-risk than I would like.  Plan to switch to  Effexor twice daily and followup in one week. Patient agreed to contract for safety. Suicidal precautions reviewed

## 2012-02-02 ENCOUNTER — Ambulatory Visit (INDEPENDENT_AMBULATORY_CARE_PROVIDER_SITE_OTHER): Payer: Self-pay | Admitting: Family Medicine

## 2012-02-02 ENCOUNTER — Encounter: Payer: Self-pay | Admitting: Family Medicine

## 2012-02-02 VITALS — BP 124/89 | HR 91 | Ht 67.0 in | Wt 103.7 lb

## 2012-02-02 DIAGNOSIS — I10 Essential (primary) hypertension: Secondary | ICD-10-CM

## 2012-02-02 DIAGNOSIS — F322 Major depressive disorder, single episode, severe without psychotic features: Secondary | ICD-10-CM

## 2012-02-02 MED ORDER — VENLAFAXINE HCL ER 150 MG PO CP24: 150.0000 mg | ORAL_CAPSULE | Freq: Every day | ORAL | Status: DC

## 2012-02-02 NOTE — Progress Notes (Signed)
[name] is a 51 y.o. female who presents to [facility] today for depression. Patient has severe major depression with PHQ9 scores were maxed out. Previously she is expressed passive death wish. She is taking her Zoloft intermittently. At the last visit Zoloft was exchanged for Effexor twice a day. However it was too expensive at [facility]  ($80) so she did not pick it up. She continues to take Zoloft total 100 mg daily. She denies any suicidality today or passive death wish. She does note continued anhedonia that is bothersome.  Blood pressure: Taking lisinopril/hydrochlorothiazide combination pill. No chest pains or palpitations edema or dyspnea.   PMH: Reviewed hypertension and depression History  Substance Use Topics  . Smoking status: Former Smoker    Types: Cigarettes    Quit date: 11/02/2000  . Smokeless tobacco: Not on file  . Alcohol Use: No   ROS as above  Medications reviewed. Current Outpatient Prescriptions  Medication Sig Dispense Refill  . lisinopril-hydrochlorothiazide (ZESTORETIC) 20-12.5 MG per tablet Take 1 tablet by mouth daily.  90 tablet  3  . magnesium 30 MG tablet Take 1 tablet (30 mg total) by mouth 2 (two) times daily.  60 tablet  1  . potassium chloride SA (K-DUR,KLOR-CON) 20 MEQ tablet Take 1 tablet (20 mEq total) by mouth 2 (two) times daily.  30 tablet  0  . venlafaxine XR (EFFEXOR-XR) 150 MG 24 hr capsule Take 1 capsule (150 mg total) by mouth daily.  30 capsule  2    Exam:  BP 124/89  Pulse 91  Ht 5\' 7"  (1.702 m)  Wt 103 lb 11.2 oz (47.038 kg)  BMI 16.24 kg/m2 Gen: Well NAD HEENT: EOMI,  MMM Exts: Non edematous BL  LE, warm and well perfused.  Psych:  Affect is somewhat flattened. Normal eye contact. Normal speech and thought process. No SI/HI  No results found for this or any previous visit (from the past 72 hour(s)).

## 2012-02-02 NOTE — Patient Instructions (Addendum)
Thank you for coming in today. Please get the effexor prescription from [facility].  It should be cheaper.  Come back in 2 weeks.  It will be with a different doctor.  I will be available for questions from the new doctor.  If you feel like hurting yourself or others please call us or 911 or go to the hospital.

## 2012-02-02 NOTE — Assessment & Plan Note (Signed)
Research pricing of Effexor. Controlled-release Effexor 150 mg is $13 at [employer]. Discuss with patient and call in this prescription to [facility] pharmacy. She should be able to pick up in early July when she has more money.   Discuss suicidal precautions. Patient expresses understanding. F/u in 2 weeks.

## 2012-02-02 NOTE — Assessment & Plan Note (Signed)
Within range today. No plans to change

## 2012-02-14 ENCOUNTER — Ambulatory Visit: Payer: Self-pay | Admitting: Family Medicine

## 2012-02-29 ENCOUNTER — Ambulatory Visit (INDEPENDENT_AMBULATORY_CARE_PROVIDER_SITE_OTHER): Payer: Self-pay | Admitting: Family Medicine

## 2012-02-29 ENCOUNTER — Encounter: Payer: Self-pay | Admitting: Family Medicine

## 2012-02-29 ENCOUNTER — Inpatient Hospital Stay (HOSPITAL_COMMUNITY): Payer: Self-pay

## 2012-02-29 ENCOUNTER — Inpatient Hospital Stay (HOSPITAL_COMMUNITY)
Admission: AD | Admit: 2012-02-29 | Discharge: 2012-03-17 | DRG: 177 | Disposition: A | Discharge status: Skilled Nursing Facility | Payer: MEDICAID | Source: Ambulatory Visit | Attending: Family Medicine | Admitting: Family Medicine

## 2012-02-29 ENCOUNTER — Encounter (HOSPITAL_COMMUNITY): Payer: Self-pay | Admitting: General Practice

## 2012-02-29 VITALS — BP 114/82 | HR 124 | Temp 99.2°F | Ht 67.0 in | Wt 95.0 lb

## 2012-02-29 DIAGNOSIS — K8689 Other specified diseases of pancreas: Secondary | ICD-10-CM | POA: Diagnosis present

## 2012-02-29 DIAGNOSIS — I10 Essential (primary) hypertension: Secondary | ICD-10-CM

## 2012-02-29 DIAGNOSIS — R0902 Hypoxemia: Secondary | ICD-10-CM

## 2012-02-29 DIAGNOSIS — K861 Other chronic pancreatitis: Secondary | ICD-10-CM | POA: Diagnosis present

## 2012-02-29 DIAGNOSIS — Z79899 Other long term (current) drug therapy: Secondary | ICD-10-CM

## 2012-02-29 DIAGNOSIS — R197 Diarrhea, unspecified: Secondary | ICD-10-CM | POA: Diagnosis not present

## 2012-02-29 DIAGNOSIS — R64 Cachexia: Secondary | ICD-10-CM | POA: Diagnosis present

## 2012-02-29 DIAGNOSIS — F1010 Alcohol abuse, uncomplicated: Secondary | ICD-10-CM

## 2012-02-29 DIAGNOSIS — D539 Nutritional anemia, unspecified: Secondary | ICD-10-CM | POA: Diagnosis not present

## 2012-02-29 DIAGNOSIS — J850 Gangrene and necrosis of lung: Secondary | ICD-10-CM

## 2012-02-29 DIAGNOSIS — R933 Abnormal findings on diagnostic imaging of other parts of digestive tract: Secondary | ICD-10-CM

## 2012-02-29 DIAGNOSIS — R0602 Shortness of breath: Secondary | ICD-10-CM

## 2012-02-29 DIAGNOSIS — F10239 Alcohol dependence with withdrawal, unspecified: Secondary | ICD-10-CM

## 2012-02-29 DIAGNOSIS — K8020 Calculus of gallbladder without cholecystitis without obstruction: Secondary | ICD-10-CM

## 2012-02-29 DIAGNOSIS — K7010 Alcoholic hepatitis without ascites: Secondary | ICD-10-CM | POA: Diagnosis present

## 2012-02-29 DIAGNOSIS — R7309 Other abnormal glucose: Secondary | ICD-10-CM | POA: Diagnosis present

## 2012-02-29 DIAGNOSIS — D649 Anemia, unspecified: Secondary | ICD-10-CM

## 2012-02-29 DIAGNOSIS — F1020 Alcohol dependence, uncomplicated: Secondary | ICD-10-CM | POA: Diagnosis present

## 2012-02-29 DIAGNOSIS — Z853 Personal history of malignant neoplasm of breast: Secondary | ICD-10-CM

## 2012-02-29 DIAGNOSIS — D509 Iron deficiency anemia, unspecified: Secondary | ICD-10-CM | POA: Diagnosis not present

## 2012-02-29 DIAGNOSIS — R109 Unspecified abdominal pain: Secondary | ICD-10-CM

## 2012-02-29 DIAGNOSIS — F10939 Alcohol use, unspecified with withdrawal, unspecified: Secondary | ICD-10-CM

## 2012-02-29 DIAGNOSIS — Z681 Body mass index (BMI) 19 or less, adult: Secondary | ICD-10-CM

## 2012-02-29 DIAGNOSIS — F322 Major depressive disorder, single episode, severe without psychotic features: Secondary | ICD-10-CM

## 2012-02-29 DIAGNOSIS — E876 Hypokalemia: Secondary | ICD-10-CM | POA: Diagnosis present

## 2012-02-29 DIAGNOSIS — J189 Pneumonia, unspecified organism: Secondary | ICD-10-CM

## 2012-02-29 DIAGNOSIS — G252 Other specified forms of tremor: Secondary | ICD-10-CM | POA: Diagnosis present

## 2012-02-29 DIAGNOSIS — N179 Acute kidney failure, unspecified: Secondary | ICD-10-CM | POA: Diagnosis present

## 2012-02-29 DIAGNOSIS — R339 Retention of urine, unspecified: Secondary | ICD-10-CM | POA: Diagnosis not present

## 2012-02-29 DIAGNOSIS — F1027 Alcohol dependence with alcohol-induced persisting dementia: Secondary | ICD-10-CM | POA: Diagnosis present

## 2012-02-29 DIAGNOSIS — R4182 Altered mental status, unspecified: Secondary | ICD-10-CM

## 2012-02-29 DIAGNOSIS — G250 Essential tremor: Secondary | ICD-10-CM | POA: Diagnosis present

## 2012-02-29 DIAGNOSIS — E46 Unspecified protein-calorie malnutrition: Secondary | ICD-10-CM | POA: Diagnosis present

## 2012-02-29 DIAGNOSIS — F329 Major depressive disorder, single episode, unspecified: Secondary | ICD-10-CM | POA: Diagnosis present

## 2012-02-29 DIAGNOSIS — F3289 Other specified depressive episodes: Secondary | ICD-10-CM | POA: Diagnosis present

## 2012-02-29 DIAGNOSIS — K7689 Other specified diseases of liver: Secondary | ICD-10-CM | POA: Diagnosis present

## 2012-02-29 DIAGNOSIS — J690 Pneumonitis due to inhalation of food and vomit: Principal | ICD-10-CM | POA: Diagnosis present

## 2012-02-29 DIAGNOSIS — R195 Other fecal abnormalities: Secondary | ICD-10-CM

## 2012-02-29 DIAGNOSIS — Z720 Tobacco use: Secondary | ICD-10-CM

## 2012-02-29 DIAGNOSIS — R000 Tachycardia, unspecified: Secondary | ICD-10-CM

## 2012-02-29 DIAGNOSIS — J852 Abscess of lung without pneumonia: Secondary | ICD-10-CM | POA: Diagnosis present

## 2012-02-29 HISTORY — DX: Shortness of breath: R06.02

## 2012-02-29 LAB — COMPREHENSIVE METABOLIC PANEL
ALT: 12 U/L (ref 0–35)
Albumin: 1.6 g/dL — ABNORMAL LOW (ref 3.5–5.2)
Alkaline Phosphatase: 79 U/L (ref 39–117)
Chloride: 84 mEq/L — ABNORMAL LOW (ref 96–112)
Glucose, Bld: 127 mg/dL — ABNORMAL HIGH (ref 70–99)
Potassium: 2.1 mEq/L — CL (ref 3.5–5.1)
Sodium: 130 mEq/L — ABNORMAL LOW (ref 135–145)
Total Bilirubin: 2.1 mg/dL — ABNORMAL HIGH (ref 0.3–1.2)
Total Protein: 7.5 g/dL (ref 6.0–8.3)

## 2012-02-29 LAB — DIFFERENTIAL
Basophils Relative: 0 % (ref 0–1)
Eosinophils Relative: 0 % (ref 0–5)
Lymphs Abs: 0.6 10*3/uL — ABNORMAL LOW (ref 0.7–4.0)
Monocytes Absolute: 0.1 10*3/uL (ref 0.1–1.0)
Neutro Abs: 13.7 10*3/uL — ABNORMAL HIGH (ref 1.7–7.7)

## 2012-02-29 LAB — RENAL FUNCTION PANEL
CO2: 19 mEq/L (ref 19–32)
Calcium: 6.7 mg/dL — ABNORMAL LOW (ref 8.4–10.5)
Creatinine, Ser: 0.9 mg/dL (ref 0.50–1.10)
GFR calc non Af Amer: 74 mL/min — ABNORMAL LOW (ref >90)
Phosphorus: 1.9 mg/dL — ABNORMAL LOW (ref 2.3–4.6)

## 2012-02-29 LAB — CBC
Hemoglobin: 8.5 g/dL — ABNORMAL LOW (ref 12.0–15.0)
MCHC: 35.3 g/dL (ref 30.0–36.0)
RDW: 15 % (ref 11.5–15.5)
WBC: 14.4 10*3/uL — ABNORMAL HIGH (ref 4.0–10.5)

## 2012-02-29 LAB — AMMONIA: Ammonia: 15 umol/L (ref 11–60)

## 2012-02-29 LAB — LIPASE, BLOOD: Lipase: 13 U/L (ref 11–59)

## 2012-02-29 LAB — PROTIME-INR: INR: 1.27 (ref 0.00–1.49)

## 2012-02-29 MED ORDER — ONDANSETRON HCL 4 MG PO TABS: 4.0000 mg | ORAL_TABLET | Freq: Four times a day (QID) | ORAL | Status: DC | PRN

## 2012-02-29 MED ORDER — PIPERACILLIN-TAZOBACTAM 3.375 G IVPB
3.3750 g | Freq: Three times a day (TID) | INTRAVENOUS | Status: DC
  Administered 2012-02-29 – 2012-03-12 (×36): 3.375 g via INTRAVENOUS
  Filled 2012-02-29 (×37): qty 50

## 2012-02-29 MED ORDER — PANTOPRAZOLE SODIUM 40 MG IV SOLR
40.0000 mg | Freq: Every day | INTRAVENOUS | Status: DC
  Administered 2012-02-29 – 2012-03-03 (×4): 40 mg via INTRAVENOUS
  Filled 2012-02-29 (×5): qty 40

## 2012-02-29 MED ORDER — FOLIC ACID 1 MG PO TABS
1.0000 mg | ORAL_TABLET | Freq: Every day | ORAL | Status: DC
  Administered 2012-03-01 – 2012-03-17 (×17): 1 mg via ORAL
  Filled 2012-02-29 (×17): qty 1

## 2012-02-29 MED ORDER — THIAMINE HCL 100 MG/ML IJ SOLN
Freq: Once | INTRAVENOUS | Status: AC
  Administered 2012-02-29: 16:00:00 via INTRAVENOUS
  Filled 2012-02-29: qty 1000

## 2012-02-29 MED ORDER — MAGNESIUM SULFATE 40 MG/ML IJ SOLN
2.0000 g | Freq: Once | INTRAMUSCULAR | Status: AC
  Administered 2012-02-29: 2 g via INTRAVENOUS
  Filled 2012-02-29: qty 50

## 2012-02-29 MED ORDER — POTASSIUM CHLORIDE 10 MEQ/100ML IV SOLN
10.0000 meq | INTRAVENOUS | Status: AC
  Administered 2012-02-29 – 2012-03-01 (×10): 10 meq via INTRAVENOUS
  Filled 2012-02-29 (×18): qty 100

## 2012-02-29 MED ORDER — MAGNESIUM SULFATE 40 MG/ML IJ SOLN
4.0000 g | Freq: Once | INTRAMUSCULAR | Status: AC
  Administered 2012-02-29: 4 g via INTRAVENOUS
  Filled 2012-02-29: qty 100

## 2012-02-29 MED ORDER — VENLAFAXINE HCL ER 150 MG PO CP24
150.0000 mg | ORAL_CAPSULE | Freq: Every day | ORAL | Status: DC
  Administered 2012-03-01 – 2012-03-17 (×17): 150 mg via ORAL
  Filled 2012-02-29 (×18): qty 1

## 2012-02-29 MED ORDER — MORPHINE SULFATE 2 MG/ML IJ SOLN
2.0000 mg | INTRAMUSCULAR | Status: DC | PRN
  Administered 2012-02-29 – 2012-03-01 (×2): 2 mg via INTRAVENOUS
  Filled 2012-02-29 (×2): qty 1

## 2012-02-29 MED ORDER — SODIUM CHLORIDE 0.9 % IV SOLN
INTRAVENOUS | Status: DC
  Administered 2012-03-01 – 2012-03-03 (×7): via INTRAVENOUS
  Administered 2012-03-04: 75 mL/h via INTRAVENOUS
  Administered 2012-03-04: 12:00:00 via INTRAVENOUS
  Administered 2012-03-05: 75 mL/h via INTRAVENOUS
  Administered 2012-03-06: 08:00:00 via INTRAVENOUS
  Administered 2012-03-06: 50 mL/h via INTRAVENOUS
  Administered 2012-03-07 – 2012-03-08 (×2): via INTRAVENOUS
  Administered 2012-03-08 – 2012-03-09 (×2): 75 mL/h via INTRAVENOUS
  Administered 2012-03-10 – 2012-03-13 (×5): via INTRAVENOUS
  Administered 2012-03-13: 100 mL/h via INTRAVENOUS
  Administered 2012-03-14 – 2012-03-15 (×3): via INTRAVENOUS
  Administered 2012-03-17: 20 mL/h via INTRAVENOUS

## 2012-02-29 MED ORDER — IOHEXOL 300 MG/ML  SOLN
80.0000 mL | Freq: Once | INTRAMUSCULAR | Status: AC | PRN
  Administered 2012-02-29: 80 mL via INTRAVENOUS

## 2012-02-29 MED ORDER — SODIUM CHLORIDE 0.9 % IV BOLUS (SEPSIS)
500.0000 mL | Freq: Once | INTRAVENOUS | Status: AC
  Administered 2012-02-29: 500 mL via INTRAVENOUS

## 2012-02-29 MED ORDER — ONDANSETRON HCL 4 MG/2ML IJ SOLN
4.0000 mg | Freq: Four times a day (QID) | INTRAMUSCULAR | Status: DC | PRN
  Administered 2012-02-29: 4 mg via INTRAVENOUS
  Filled 2012-02-29: qty 2

## 2012-02-29 MED ORDER — SODIUM CHLORIDE 0.9 % IJ SOLN
3.0000 mL | Freq: Two times a day (BID) | INTRAMUSCULAR | Status: DC
  Administered 2012-03-01 – 2012-03-14 (×17): 3 mL via INTRAVENOUS

## 2012-02-29 MED ORDER — ADULT MULTIVITAMIN W/MINERALS CH
1.0000 | ORAL_TABLET | Freq: Every day | ORAL | Status: DC
  Administered 2012-03-01 – 2012-03-17 (×17): 1 via ORAL
  Filled 2012-02-29 (×17): qty 1

## 2012-02-29 MED ORDER — POTASSIUM CHLORIDE 10 MEQ/100ML IV SOLN
10.0000 meq | INTRAVENOUS | Status: DC
  Filled 2012-02-29 (×4): qty 100

## 2012-02-29 MED ORDER — POTASSIUM CHLORIDE CRYS ER 20 MEQ PO TBCR
40.0000 meq | EXTENDED_RELEASE_TABLET | Freq: Every day | ORAL | Status: DC
  Filled 2012-02-29: qty 2

## 2012-02-29 NOTE — Progress Notes (Signed)
CRITICAL VALUE ALERT  Critical value received:  Potassium 2.5  Date of notification:  02/29/2012  Time of notification: 2249  Critical value read back:yes  Nurse who received alert:  Sanda Linger  MD notified (1st page):  Burnis Medin, MD  Time of first page:  2214  MD notified (2nd page):  Time of second page:  Responding MD: Burnis Medin, MD  Time MD responded:  2219  Recommendations: Continue current runs of potassium; Get labs in am.

## 2012-02-29 NOTE — Progress Notes (Signed)
ANTIBIOTIC CONSULT NOTE - INITIAL  Pharmacy Consult for Zosyn Indication: rule out pneumonia  No Known Allergies  Patient Measurements:     Vital Signs: Temp: 98.5 F (36.9 C) (07/18 1319) Temp src: Oral (07/18 1319) BP: 119/87 mmHg (07/18 1319) Pulse Rate: 118  (07/18 1319) Intake/Output from previous day:   Intake/Output from this shift:    Labs:  Basename 02/29/12 1353  WBC 14.4*  HGB 8.5*  PLT 383  LABCREA --  CREATININE 1.13*   The CrCl is unknown because both a height and weight (above a minimum accepted value) are required for this calculation. No results found for this basename: VANCOTROUGH:2,VANCOPEAK:2,VANCORANDOM:2,GENTTROUGH:2,GENTPEAK:2,GENTRANDOM:2,TOBRATROUGH:2,TOBRAPEAK:2,TOBRARND:2,AMIKACINPEAK:2,AMIKACINTROU:2,AMIKACIN:2, in the last 72 hours   Microbiology: No results found for this or any previous visit (from the past 720 hour(s)).  Medical History: Past Medical History  Diagnosis Date  . Hypertension   . Alcoholic hepatitis   . Alcoholism   . Breast cancer 2001    Rt side. Had lumpectomy, chemo and XRT by [facility]    Medications:  Prescriptions prior to admission  Medication Sig Dispense Refill  . lisinopril-hydrochlorothiazide (ZESTORETIC) 20-12.5 MG per tablet Take 1 tablet by mouth daily.  90 tablet  3  . magnesium 30 MG tablet Take 1 tablet (30 mg total) by mouth 2 (two) times daily.  60 tablet  1  . potassium chloride SA (K-DUR,KLOR-CON) 20 MEQ tablet Take 1 tablet (20 mEq total) by mouth 2 (two) times daily.  30 tablet  0  . venlafaxine XR (EFFEXOR-XR) 150 MG 24 hr capsule Take 1 capsule (150 mg total) by mouth daily.  30 capsule  2   Assessment: 51 yo lady admitted with SOB and cough to start zosyn for r/o aspiration PNA vs CAP.  Her SrCr is 1.13.  Goal of Therapy:  Eradication of infection  Plan:  Zosyn 3.375 gm IV q8 hours. F/u clinical course, cultures and renal function.  Talbert Cage Poteet 02/29/2012,4:32 PM

## 2012-02-29 NOTE — Progress Notes (Signed)
Patient ID: [name], female   DOB: 03/19/1961, 51 y.o.   MRN: [id number] FMTS Attending Admit Note  Patient seen and examined by me in [facility] along with resident Dr. Ashley Royalty.  I agree with decision to admit to hospital.   Briefly, [name] is a cachectic 51yoF with alcoholism who presents with complaint of cough productive of green sputum; also with persistent nausea/vomiting and unable to keep down anything by mouth during that time.  She reports that she usually drinks 4 drinks of alcohol per day, but has been unable to tolerate even that during this episode of illness. Question of subjective fever.  In the office today she is moderately ill-appearing, laying in supine position on table in some distress. Exam notable for dry oral mucus membranes; neck is supple. Sclerae are muddy to icteric in appearance. Temporal wasting. COR tachycardic, S1S2 PULM Difficult to assess due to poor inspiratory effort.  ABD soft; RUQ tenderness and liver edge assessed @2 -3cm below costal margin by percussion.  Audible bowel sounds.  LEs wasting; non-edematous.   Assess/Plan: Patient with intolerance to orals; recurrent vomiting; cough with sputum production; jaundice and hepatomegaly.  Chronic alcohol abuse.  Primary concern for abd mass that is creating biliary obstruction; possibly complicated by aspiration/PNA.  Plan to get CMet, CBC and lipase, and begin IV rehydration and correction of any electrolyte derangements.  CT abd/pelvis to evaluate for possible mass.  CXR for consideration of PNA.  May reassess once on the floor (and prelim studies are back) regarding the need for abx treatment of possible PNA (exam is not convincing at this time).  Patient has a friend who is able to accompany her to the admitting office of [facility].  Paula Compton, MD

## 2012-02-29 NOTE — Progress Notes (Signed)
Pt attempted to take PO KCl, but vomited; family medicine paged and made aware that will not be able to tolerate PO potassium and contrast for CT abd/pelvis

## 2012-02-29 NOTE — Progress Notes (Signed)
Spoke with Dr.Hess about CT of abd/pelvis with contrast, said ok if pt cannot drink contrast; still get CT scan, radiology notified

## 2012-02-29 NOTE — Progress Notes (Signed)
Patient ID: [name], female   DOB: 03-Jan-1961, 51 y.o.   MRN: [id number] [name] is an 51 y.o. female.    Chief Complaint: Shortness of breath/Abdominal pain  HPI: 51 yo female with history of EtOH abuse, Hypertriglyceridemia, and alcoholic hepatitis here with complaint of shortness of breath and cough as well as inability to tolerate PO for the past week.  She states that she has not felt very good for the past month with increased shortness of breath and cough.  Over the past week she has had inability to tolerate PO and has had nausea and vomiting when trying to take any PO intake.  Also with some moderate epigastric discomfort.  She has also noticed that her stool have changed to diarrhea and are "dark green" in color.  Does endorse mild sputum production and subjective fever.   She denies any blood in her stool, hematemesis, chest pain, dysuria, headache.  She has not had any EtOH since 1 week ago when her symptoms worsened.   Past Medical History  Diagnosis Date  . Hypertension   . Alcoholic hepatitis   . Alcoholism   . Breast cancer 2001    Rt side. Had lumpectomy, chemo and XRT by [facility]    Past Surgical History  Procedure Date  . Rt lumpectomy 2001    No family history on file. Social History:  reports that she quit smoking about 11 years ago. Her smoking use included Cigarettes. She does not have any smokeless tobacco history on file. She reports that she drinks about 25.2 ounces of alcohol per week. She reports that she does not use illicit drugs.  Allergies: No Known Allergies  Medications Prior to Admission  Medication Sig Dispense Refill  . lisinopril-hydrochlorothiazide (ZESTORETIC) 20-12.5 MG per tablet Take 1 tablet by mouth daily.  90 tablet  3  . magnesium 30 MG tablet Take 1 tablet (30 mg total) by mouth 2 (two) times daily.  60 tablet  1  . potassium chloride SA (K-DUR,KLOR-CON) 20 MEQ tablet Take 1 tablet (20 mEq total) by mouth 2 (two) times daily.  30  tablet  0  . venlafaxine XR (EFFEXOR-XR) 150 MG 24 hr capsule Take 1 capsule (150 mg total) by mouth daily.  30 capsule  2    No results found for this or any previous visit (from the past 48 hour(s)). No results found.  Review of Systems  Constitutional: Positive for chills, weight loss and malaise/fatigue.  Eyes: Negative for blurred vision.  Respiratory: Positive for cough, sputum production and shortness of breath. Negative for wheezing.   Cardiovascular: Negative for chest pain.  Gastrointestinal: Positive for nausea, vomiting, abdominal pain and diarrhea. Negative for blood in stool.  Genitourinary: Negative for dysuria.  Neurological: Negative for dizziness, tremors and headaches.  Psychiatric/Behavioral: Positive for depression, suicidal ideas and substance abuse (EtOH).    Blood pressure 119/87, pulse 118, temperature 98.5 F (36.9 C), temperature source Oral, resp. rate 16, SpO2 97.00%. Physical Exam  Constitutional: She is oriented to person, place, and time.       Cachectic appearing female, appears very weak.  HENT:       Bitemporal wasting present.  Oropharynx dry.  Upper dentition poor.   Eyes: Pupils are equal, round, and reactive to light. Scleral icterus is present.  Neck: Neck supple. No thyromegaly present.  Cardiovascular: Regular rhythm.  Exam reveals friction rub.   No murmur heard.      Tachycardic   Respiratory: Effort  normal and breath sounds normal. No respiratory distress.  GI: Soft. Bowel sounds are normal. She exhibits no distension.       Epigastric tenderness, worse LUQ.  Mild tenderness to palpation over liver.  Mild hepatomegaly present.   Musculoskeletal: She exhibits no edema and no tenderness.  Lymphadenopathy:    She has no cervical adenopathy.  Neurological: She is alert and oriented to person, place, and time.     Assessment/Plan 51 year old female with inability to tolerate PO with shortness of breath.  1.  Abdominal pain/anorexia:   Patient with history of EtOH abuse and EtOH induced hepatitis, now with abdominal pain, icterus and inability to tolerate PO.  Broad differential including pancreatitis, hepatitis, malignancy, gallstones, gastritis.  Will admit to telemetry and hydrate.  Will check labwork to evaluate liver and pancreas.  Will also obtain CT abdomen and pelvis for further evaluation of what may be causing her symptoms.  Will keep NPO for now, and use zofran for nausea control.    2. Shortness of breath/Cough:  Given history of EtOH abuse, potential for aspiration.  Subjective fevers at home.  Check CBC and obtain CXR to evaluate for possible PNA.  If she has underlying malignancy there is potential for PE, however my clinical suspicion for that is low at this time.    3. EtOH abuse:  Last drink >7 days ago.  Unlikely to withdraw at this point.  Will give her multivitamin as well as folic acid and thiamine.    4.  Tachycardia:  Likely 2/2 to dehydration, although as above PE has to be kept in differential if there is underlying malignancy.  Will give NS bolus and maintenance.  5. Depression:  Has expressed passive SI in the past as well as today.  No plan.  Continue home SSRI.   6. HTN:  BP on the low side, will hold anti-hypertensives for now.  Add back on when appropriate.   7. FEN/GI:  NPO for now, NS @ 112mL/hr. MVI with thiamine and folic acid   8.  PPX:  SCD's until PT/INR and platelets return, Protonix.   9.  Dispo: Pending further work up

## 2012-02-29 NOTE — Progress Notes (Signed)
Received lab values of Mg:1.0 and K:2.1, Hgb:8.5; EKG obtained with QTc 573, paged family medicine and made aware of results; pts only c/o abd pain; question MD after reading pts H/P and noting that had night sweats, cough, weight loss for the past month, and was told no need to place on airborne;  also spoke with pt and pt stated that she was not having any thoughts of harming herself; will continue to monitor

## 2012-02-29 NOTE — Progress Notes (Signed)
Spoke with Dr.Hess regarding new orders for IV Mg; was told to give additional 4g of Mg along with the other 2g; will continue to monitor

## 2012-02-29 NOTE — H&P (Signed)
[name] is an 51 y.o. female.    Chief Complaint: Shortness of breath/Abdominal pain  HPI: 51 yo female with history of EtOH abuse, Hypertriglyceridemia, and alcoholic hepatitis here with complaint of shortness of breath and cough as well as inability to tolerate PO for the past week.  She states that she has not felt very good for the past month with increased shortness of breath and cough.  Over the past week she has had inability to tolerate PO and has had nausea and vomiting when trying to take any PO intake.  Also with some moderate epigastric discomfort.  She has also noticed that her stool have changed to diarrhea and are "dark green" in color.  Does endorse mild sputum production and subjective fever.   She denies any blood in her stool, hematemesis, chest pain, dysuria, headache.  She has not had any EtOH since 1 week ago when her symptoms worsened.   Past Medical History  Diagnosis Date  . Hypertension   . Alcoholic hepatitis   . Alcoholism   . Breast cancer 2001    Rt side. Had lumpectomy, chemo and XRT by [facility]    Past Surgical History  Procedure Date  . Rt lumpectomy 2001    No family history on file. Social History:  reports that she quit smoking about 11 years ago. Her smoking use included Cigarettes. She does not have any smokeless tobacco history on file. She reports that she drinks about 25.2 ounces of alcohol per week. She reports that she does not use illicit drugs.  Allergies: No Known Allergies  Medications Prior to Admission  Medication Sig Dispense Refill  . lisinopril-hydrochlorothiazide (ZESTORETIC) 20-12.5 MG per tablet Take 1 tablet by mouth daily.  90 tablet  3  . magnesium 30 MG tablet Take 1 tablet (30 mg total) by mouth 2 (two) times daily.  60 tablet  1  . potassium chloride SA (K-DUR,KLOR-CON) 20 MEQ tablet Take 1 tablet (20 mEq total) by mouth 2 (two) times daily.  30 tablet  0  . venlafaxine XR (EFFEXOR-XR) 150 MG 24 hr capsule Take 1 capsule  (150 mg total) by mouth daily.  30 capsule  2    No results found for this or any previous visit (from the past 48 hour(s)). No results found.  Review of Systems  Constitutional: Positive for chills, weight loss and malaise/fatigue.  Eyes: Negative for blurred vision.  Respiratory: Positive for cough, sputum production and shortness of breath. Negative for wheezing.   Cardiovascular: Negative for chest pain.  Gastrointestinal: Positive for nausea, vomiting, abdominal pain and diarrhea. Negative for blood in stool.  Genitourinary: Negative for dysuria.  Neurological: Negative for dizziness, tremors and headaches.  Psychiatric/Behavioral: Positive for depression, suicidal ideas and substance abuse (EtOH).    Blood pressure 119/87, pulse 118, temperature 98.5 F (36.9 C), temperature source Oral, resp. rate 16, SpO2 97.00%. Physical Exam  Constitutional: She is oriented to person, place, and time.       Cachectic appearing female, appears very weak.  HENT:       Bitemporal wasting present.  Oropharynx dry.  Upper dentition poor.   Eyes: Pupils are equal, round, and reactive to light. Scleral icterus is present.  Neck: Neck supple. No thyromegaly present.  Cardiovascular: Regular rhythm.  Exam reveals friction rub.   No murmur heard.      Tachycardic   Respiratory: Effort normal and breath sounds normal. No respiratory distress.  GI: Soft. Bowel sounds are normal. She  exhibits no distension.       Epigastric tenderness, worse LUQ.  Mild tenderness to palpation over liver.  Mild hepatomegaly present.   Musculoskeletal: She exhibits no edema and no tenderness.  Lymphadenopathy:    She has no cervical adenopathy.  Neurological: She is alert and oriented to person, place, and time.     Assessment/Plan 51 year old female with inability to tolerate PO with shortness of breath.  1.  Abdominal pain/anorexia:  Patient with history of EtOH abuse and EtOH induced hepatitis, now with  abdominal pain, icterus and inability to tolerate PO.  Broad differential including pancreatitis, hepatitis, malignancy, gallstones, gastritis.  Will admit to telemetry and hydrate.  Will check labwork to evaluate liver and pancreas.  Will also obtain CT abdomen and pelvis for further evaluation of what may be causing her symptoms.  Will keep NPO for now, and use zofran for nausea control.    2. Shortness of breath/Cough:  Given history of EtOH abuse, potential for aspiration.  Subjective fevers at home.  Check CBC and obtain CXR to evaluate for possible PNA.  If she has underlying malignancy there is potential for PE, however my clinical suspicion for that is low at this time.    3. EtOH abuse:  Last drink >7 days ago.  Unlikely to withdraw at this point.  Will give her multivitamin as well as folic acid and thiamine.    4.  Tachycardia:  Likely 2/2 to dehydration, although as above PE has to be kept in differential if there is underlying malignancy.  Will give NS bolus and maintenance.  5. Depression:  Has expressed passive SI in the past as well as today.  No plan.  Continue home SSRI.   6. HTN:  BP on the low side, will hold anti-hypertensives for now.  Add back on when appropriate.   7. FEN/GI:  NPO for now, NS @ 199mL/hr. MVI with thiamine and folic acid   8.  PPX:  SCD's until PT/INR and platelets return, Protonix.   9.  Dispo: Pending further work up  Duglas Heier 02/29/2012, 1:39 PM

## 2012-03-01 ENCOUNTER — Inpatient Hospital Stay (HOSPITAL_COMMUNITY): Payer: Self-pay

## 2012-03-01 DIAGNOSIS — K8020 Calculus of gallbladder without cholecystitis without obstruction: Secondary | ICD-10-CM

## 2012-03-01 DIAGNOSIS — F1010 Alcohol abuse, uncomplicated: Secondary | ICD-10-CM

## 2012-03-01 DIAGNOSIS — R933 Abnormal findings on diagnostic imaging of other parts of digestive tract: Secondary | ICD-10-CM

## 2012-03-01 DIAGNOSIS — R109 Unspecified abdominal pain: Secondary | ICD-10-CM

## 2012-03-01 LAB — CBC
Hemoglobin: 7.4 g/dL — ABNORMAL LOW (ref 12.0–15.0)
Hemoglobin: 6.9 g/dL — CL (ref 12.0–15.0)
MCH: 34.6 pg — ABNORMAL HIGH (ref 26.0–34.0)
MCH: 36 pg — ABNORMAL HIGH (ref 26.0–34.0)
MCHC: 34.7 g/dL (ref 30.0–36.0)
MCHC: 35.4 g/dL (ref 30.0–36.0)
MCV: 101.6 fL — ABNORMAL HIGH (ref 78.0–100.0)
Platelets: 328 10*3/uL (ref 150–400)
RBC: 1.98 MIL/uL — ABNORMAL LOW (ref 3.87–5.11)
RDW: 15.6 % — ABNORMAL HIGH (ref 11.5–15.5)

## 2012-03-01 LAB — COMPREHENSIVE METABOLIC PANEL
ALT: 10 U/L (ref 0–35)
BUN: 47 mg/dL — ABNORMAL HIGH (ref 6–23)
Calcium: 6.2 mg/dL — CL (ref 8.4–10.5)
GFR calc Af Amer: >90 mL/min (ref >90)
Glucose, Bld: 113 mg/dL — ABNORMAL HIGH (ref 70–99)
Sodium: 131 mEq/L — ABNORMAL LOW (ref 135–145)
Total Protein: 6.2 g/dL (ref 6.0–8.3)

## 2012-03-01 LAB — BASIC METABOLIC PANEL
Chloride: 97 mEq/L (ref 96–112)
GFR calc Af Amer: >90 mL/min (ref >90)
Potassium: 4 mEq/L (ref 3.5–5.1)

## 2012-03-01 LAB — LACTIC ACID, PLASMA: Lactic Acid, Venous: 1.4 mmol/L (ref 0.5–2.2)

## 2012-03-01 LAB — TYPE AND SCREEN: ABO/RH(D): O POS

## 2012-03-01 LAB — HIV ANTIBODY (ROUTINE TESTING W REFLEX): HIV: NONREACTIVE

## 2012-03-01 MED ORDER — VITAMIN B-1 100 MG PO TABS
100.0000 mg | ORAL_TABLET | Freq: Every day | ORAL | Status: DC
  Administered 2012-03-01 – 2012-03-17 (×17): 100 mg via ORAL
  Filled 2012-03-01 (×17): qty 1

## 2012-03-01 MED ORDER — BOOST / RESOURCE BREEZE PO LIQD
1.0000 | Freq: Three times a day (TID) | ORAL | Status: DC
  Administered 2012-03-01 – 2012-03-05 (×10): 1 via ORAL

## 2012-03-01 MED ORDER — AZITHROMYCIN 500 MG PO TABS
500.0000 mg | ORAL_TABLET | Freq: Every day | ORAL | Status: AC
  Administered 2012-03-01 – 2012-03-03 (×3): 500 mg via ORAL
  Filled 2012-03-01 (×3): qty 1

## 2012-03-01 MED ORDER — GADOBENATE DIMEGLUMINE 529 MG/ML IV SOLN
10.0000 mL | Freq: Once | INTRAVENOUS | Status: AC | PRN
  Administered 2012-03-01: 10 mL via INTRAVENOUS

## 2012-03-01 MED ORDER — LORAZEPAM 2 MG/ML IJ SOLN
1.0000 mg | Freq: Four times a day (QID) | INTRAMUSCULAR | Status: AC | PRN
  Administered 2012-03-03: 1 mg via INTRAVENOUS
  Filled 2012-03-01 (×2): qty 1

## 2012-03-01 MED ORDER — POTASSIUM CHLORIDE 10 MEQ/100ML IV SOLN
10.0000 meq | INTRAVENOUS | Status: AC
  Administered 2012-03-01 (×5): 10 meq via INTRAVENOUS
  Filled 2012-03-01 (×5): qty 100

## 2012-03-01 MED ORDER — THIAMINE HCL 100 MG/ML IJ SOLN
100.0000 mg | Freq: Every day | INTRAMUSCULAR | Status: DC
  Filled 2012-03-01 (×16): qty 1

## 2012-03-01 MED ORDER — LORAZEPAM 0.5 MG PO TABS
1.0000 mg | ORAL_TABLET | Freq: Four times a day (QID) | ORAL | Status: AC | PRN
  Administered 2012-03-03: 1 mg via ORAL
  Filled 2012-03-01: qty 2

## 2012-03-01 MED ORDER — MAGNESIUM SULFATE 40 MG/ML IJ SOLN
4.0000 g | Freq: Once | INTRAMUSCULAR | Status: AC
  Administered 2012-03-01: 4 g via INTRAVENOUS
  Filled 2012-03-01: qty 100

## 2012-03-01 NOTE — Consult Note (Signed)
Referring Provider: teaching Service- Dr. Leveda Anna Primary Care Physician:  MATTHEWS,CODY, DO Primary Gastroenterologist:  none  Reason for Consultation:   ? CBD stones  HPI: [name] is a 51 y.o. female alcoholic with history of ongoing EtOH abuse, depression and hypertension. She also has remote history of breast cancer status post right lumpectomy, chemotherapy and radiation 2001 She was admitted yesterday with complaints of productive cough weakness nausea vomiting and poor by mouth intake. Apparently usually drinking for alcoholic beverages per day. On admission she underwent a chest x-ray and then CT scan which shows a dense left upper lobe and left lower lobe pneumonia, views of the abdomen showed gallstones and pancreatic head calcifications, no dilated common bile duct but there is question of filling defects. She does have significant hepatic steatosis. Labs are reviewed; pro time is 16.3/INR 1.27, BUN today 47 creatinine 0.78 total bilirubin 2.1 alkaline phosphatase 79 AST 47 ALT 12. WBC 9.6 hemoglobin 7.4 hematocrit 21.3 MCV of 99.5 and platelets of 105. Lipase was 13. K+ was 2.1 on admit-corrected Patient underwent MRCP today which confirms gallstones, chronic calcific pancreatitis, no significant common bile duct dilation but there are equivocal distal common bile duct including use which may represent multiple tiny stones. It is noted that the exam was moderately to severely degraded due to motion. Pt is unable to offer any relevant  Hx this afternoon,appears disoriented   Past Medical History  Diagnosis Date  . Hypertension   . Alcoholic hepatitis   . Alcoholism   . Breast cancer 2001    Rt side. Had lumpectomy, chemo and XRT by [facility]  . Shortness of breath     Past Surgical History  Procedure Date  . Rt lumpectomy 2001    Prior to Admission medications   Medication Sig Start Date End Date Taking? Authorizing Provider  lisinopril-hydrochlorothiazide (ZESTORETIC)  20-12.5 MG per tablet Take 1 tablet by mouth daily. 12/29/11 12/28/12 Yes Rodolph Bong, MD  magnesium 30 MG tablet Take 1 tablet (30 mg total) by mouth 2 (two) times daily. 12/18/11 12/17/12  Rodolph Bong, MD  potassium chloride SA (K-DUR,KLOR-CON) 20 MEQ tablet Take 1 tablet (20 mEq total) by mouth 2 (two) times daily. 11/25/11 11/24/12  Flint Melter, MD  venlafaxine XR (EFFEXOR-XR) 150 MG 24 hr capsule Take 1 capsule (150 mg total) by mouth daily. 02/02/12 02/01/13  Rodolph Bong, MD    Current Facility-Administered Medications  Medication Dose Route Frequency Provider Last Rate Last Dose  . 0.9 %  sodium chloride infusion   Intravenous Continuous Everrett Coombe, DO 125 mL/hr at 03/01/12 0037    . azithromycin (ZITHROMAX) tablet 500 mg  500 mg Oral Daily Stephanie Coup Street, MD   500 mg at 03/01/12 1332  . feeding supplement (RESOURCE BREEZE) liquid 1 Container  1 Container Oral TID BM Ailene Ards, RD      . folic acid (FOLVITE) tablet 1 mg  1 mg Oral Daily Everrett Coombe, DO   1 mg at 03/01/12 0951  . gadobenate dimeglumine (MULTIHANCE) injection 10 mL  10 mL Intravenous Once PRN Medication Radiologist, MD   10 mL at 03/01/12 1247  . iohexol (OMNIPAQUE) 300 MG/ML solution 80 mL  80 mL Intravenous Once PRN Medication Radiologist, MD   80 mL at 02/29/12 2229  . LORazepam (ATIVAN) tablet 1 mg  1 mg Oral Q6H PRN Bobbye Morton, MD       Or  . LORazepam (ATIVAN) injection 1 mg  1 mg Intravenous Q6H PRN Stephanie Coup [address], MD      . magnesium sulfate IVPB 2 g 50 mL  2 g Intravenous Once Twana First Hess, DO   2 g at 02/29/12 1546  . magnesium sulfate IVPB 4 g 100 mL  4 g Intravenous Once Brent Bulla, MD   4 g at 02/29/12 1752  . magnesium sulfate IVPB 4 g 100 mL  4 g Intravenous Once Bobbye Morton, MD   4 g at 03/01/12 1431  . morphine 2 MG/ML injection 2 mg  2 mg Intravenous Q2H PRN Everrett Coombe, DO   2 mg at 03/01/12 0238  . multivitamin with minerals tablet 1 tablet  1 tablet Oral  Daily Everrett Coombe, DO   1 tablet at 03/01/12 0951  . ondansetron (ZOFRAN) tablet 4 mg  4 mg Oral Q6H PRN Everrett Coombe, DO       Or  . ondansetron (ZOFRAN) injection 4 mg  4 mg Intravenous Q6H PRN Everrett Coombe, DO   4 mg at 02/29/12 1551  . pantoprazole (PROTONIX) injection 40 mg  40 mg Intravenous QHS Everrett Coombe, DO   40 mg at 02/29/12 2134  . piperacillin-tazobactam (ZOSYN) IVPB 3.375 g  3.375 g Intravenous Q8H Lora Poteet Seay, PHARMD   3.375 g at 03/01/12 0942  . potassium chloride 10 mEq in 100 mL IVPB  10 mEq Intravenous Q1H Brent Bulla, MD   10 mEq at 03/01/12 0415  . potassium chloride 10 mEq in 100 mL IVPB  10 mEq Intravenous Q1 Hr x 5 Stephanie Coup [address], MD   10 mEq at 03/01/12 1430  . sodium chloride 0.9 % 1,000 mL with thiamine 100 mg, folic acid 1 mg, multivitamins adult 10 mL infusion   Intravenous Once Everrett Coombe, DO 125 mL/hr at 02/29/12 1552    . sodium chloride 0.9 % injection 3 mL  3 mL Intravenous Q12H Everrett Coombe, DO   3 mL at 03/01/12 0951  . thiamine (VITAMIN B-1) tablet 100 mg  100 mg Oral Daily Stephanie Coup Street, MD   100 mg at 03/01/12 9604   Or  . thiamine (B-1) injection 100 mg  100 mg Intravenous Daily Stephanie Coup Street, MD      . venlafaxine XR [facility]) 24 hr capsule 150 mg  150 mg Oral Daily Everrett Coombe, DO   150 mg at 03/01/12 0951  . DISCONTD: potassium chloride 10 mEq in 100 mL IVPB  10 mEq Intravenous Q1 Hr x 4 Bryan R Hess, DO      . DISCONTD: potassium chloride SA (K-DUR,KLOR-CON) CR tablet 40 mEq  40 mEq Oral Daily Bryan R Hess, DO        Allergies as of 02/29/2012  . (No Known Allergies)    History reviewed. No pertinent family history.  History   Social History  . Marital Status: Single    Spouse Name: N/A    Number of Children: N/A  . Years of Education: N/A   Occupational History  . Not on file.   Social History Main Topics  . Smoking status: Former Smoker    Types: Cigarettes    Quit date: 11/02/2000  .  Smokeless tobacco: Never Used  . Alcohol Use: 25.2 oz/week    42 Shots of liquor per week     states she does not drink anymore  . Drug Use: No  . Sexually Active: Not Currently   Other Topics Concern  . Not on file  Social History Narrative  . No narrative on file    Review of Systems: Pertinent positive and negative review of systems were noted in the above HPI section.  All other review of systems was otherwise negative. Physical Exam: Vital signs in last 24 hours: Temp:  [97.8 F (36.6 C)-99.5 F (37.5 C)] 99.5 F (37.5 C) (07/19 0557) Pulse Rate:  [112-118] 118  (07/19 0557) Resp:  [16-20] 20  (07/19 0557) BP: (125-132)/(78-83) 132/83 mmHg (07/19 0557) SpO2:  [87 %-96 %] 87 % (07/19 1015) Weight:  [108 lb 4.8 oz (49.125 kg)] 108 lb 4.8 oz (49.125 kg) (07/19 0557) Last BM Date: 02/29/12 General:   Alert,  Well-developed,very thin AA female,  cooperative in NAD, minimaly conversant Head:  Normocephalic and atraumatic. Eyes:  Sclera clear, no icterus.   Conjunctiva pink. Ears:  Normal auditory acuity. Nose:  No deformity, discharge,  or lesions. Mouth:  No deformity or lesions.   Neck:  Supple; no masses or thyromegaly. Lungs:  Clear throughout to auscultation.   No wheezes, crackles, or rhonchi.  Heart: tachy Regular rate and rhythm; no murmurs, clicks, rubs,  or gallops. Abdomen:  Soft,tender across upper abdomen, no palp mass or HSm, bs+, no fluid wave Rectal; not done Msk:  Symmetrical without gross deformities. . Pulses:  Normal pulses noted. Extremities:  Without clubbing or edema. Neurologic:  Alert, disoriented, slighlt tremulous Skin:  Intact without significant lesions or rashes.. Psych:  Alert and cooperative. ,disoriented  Intake/Output from previous day: 07/18 0701 - 07/19 0700 In: -  Out: 250 [Urine:250] Intake/Output this shift: Total I/O In: 3 [I.V.:3] Out: 300 [Urine:300]  Lab Results:  Basename 03/01/12 0630 02/29/12 1353  WBC 9.6 14.4*    HGB 7.4* 8.5*  HCT 21.3* 24.1*  PLT PLATELET CLUMPS NOTED ON SMEAR, COUNT APPEARS ADEQUATE 383   BMET  Basename 03/01/12 0630 02/29/12 2112 02/29/12 1353  NA 131* 129* 130*  K 3.5 2.5* 2.1*  CL 95* 89* 84*  CO2 16* 19 23  GLUCOSE 113* 142* 127*  BUN 47* 58* 61*  CREATININE 0.79 0.90 1.13*  CALCIUM 6.2* 6.7* 7.2*   LFT  Basename 03/01/12 0630  PROT 6.2  ALBUMIN 1.2*  AST 53*  ALT 10  ALKPHOS 66  BILITOT 1.6*  BILIDIR --  IBILI --   PT/INR  Basename 02/29/12 1353  LABPROT 16.2*  INR 1.27   Hepatitis Panel No results found for this basename: HEPBSAG,HCVAB,HEPAIGM,HEPBIGM in the last 72 hours    Studies/Results: X-ray Chest Pa And Lateral   02/29/2012  *RADIOLOGY REPORT*  Clinical Data: Cough and shortness of breath.  CHEST - 2 VIEW  Comparison: 11/25/2011  Findings: There is a new dense consolidative infiltrate in the posterior aspect of the left lung superiorly.  Right lung is clear.  Heart size and vascularity are normal.  No effusions.  IMPRESSION: Dense pneumonia in the left lung, probably the left upper lobe.  Original Report Authenticated By: Gwynn Burly, M.D.   Ct Abdomen Pelvis W Contrast  02/29/2012  *RADIOLOGY REPORT*  Clinical Data: Abdominal pain and vomiting  CT ABDOMEN AND PELVIS WITH CONTRAST  Technique:  Multidetector CT imaging of the abdomen and pelvis was performed following the standard protocol during bolus administration of intravenous contrast.  Contrast: 80mL OMNIPAQUE IOHEXOL 300 MG/ML  SOLN  Comparison: 04/30/2007similar exam, chest radiographs today  Findings: Dense left upper lobe and superior segment left lower lobe consolidation again noted.  Presumed subpleural scarring/radiation change noted in the right middle  lobe.  Right lower lobe is clear in its visualized aspects.  Hepatic steatosis noted.  No focal abnormality or hepatic ductal dilatation.  Gallstones noted within the otherwise normal gallbladder.  Uncinate process and pancreatic  head calcifications are noted.  Pancreatic duct at upper limits of normal at the distal tail.  Calcification in the region of the uncinate process images 39 and 37 could be parenchymal or potentially within the intrapancreatic common duct.  Also see image 36 of coronal images.  Kidneys, adrenal glands, spleen are unremarkable.  Nonspecific minimal perinephric stranding is present bilaterally.  Trace upper quadrant ascites bilaterally.  Uterus and ovaries are normal.  No bowel wall thickening or focal segmental dilatation.  Appendix is not visualized but there is no secondary evidence for acute appendicitis.  The bladder is normal. No acute osseous finding. Area of hemangioma formation or possibly fibrous dysplasia in the right sacrum.  IMPRESSION: Dense left upper lobe and superior segment left lower lobe consolidation, most compatible with pneumonia.  Collapse with proximal endobronchial lesion is felt less likely.  Gallstones with possible choledocholithiasis or volume averaging from pancreatic calcification.  Consider MRCP for further evaluation.  Hepatic steatosis.  Trace upper abdominal ascites.  Original Report Authenticated By: Harrel Lemon, M.D.   Mr Abd W/wo Cm/mrcp  03/01/2012  *RADIOLOGY REPORT*  Clinical Data:  Abdominal pain.  Nausea vomiting.  Possible choledocholithiasis on CT.  Alcohol abuse and alcoholic hepatitis.  MRI ABDOMEN WITHOUT AND WITH CONTRAST (INCLUDING MRCP)  Technique:  Multiplanar multisequence MR imaging of the abdomen was performed both before and after the administration of intravenous contrast. Heavily T2-weighted images of the biliary and pancreatic ducts were obtained, and three-dimensional MRCP images were rendered by post processing.  Contrast: 10mL MULTIHANCE GADOBENATE DIMEGLUMINE 529 MG/ML IV SOLN  Comparison:  CT of 1 day prior and 12/11/2005.  Findings:  Exam is moderate to severely degraded by motion artifact and patient's inability to hold breath.  Airspace  disease is identified at the left lung base and better evaluated on recent CT.  Small left pleural effusion may be slightly increased.  Mild cardiomegaly.  Severe hepatic steatosis, with signal dropout on out of phase imaging.  No definite evidence of cirrhosis.  No intrahepatic biliary ductal dilatation.  Grossly normal appearance of the spleen, stomach.  The pancreas is mildly atrophic, with duct ectasia.  Example image 20 of series 6. No obstructive mass identified.  Gallstones are better visualized on the the recent CT.  There is mild common duct dilatation.  There are equivocal foci of abnormal signal within the distal common duct.  Example image 27 of series 6 and images 57 - 60 of series 1603.  Combined with the CT appearance, these findings are highly suspicious for numerous tiny common duct stones.  Normal adrenal glands.  Trace abdominal ascites which is similar. Normal caliber imaged abdominal bowel loops.  The kidneys are abnormal in appearance.  Mildly edematous with moderate perirenal edema, including on image 31 of series 4. Suggestion of diminished contrast excretion on the delayed images.  IMPRESSION:  1.  Moderate to severely degraded exam, secondary to motion and inability to hold breath. 2.  No biliary ductal dilatation.  Distal common duct findings, which when combined with CT findings, are most consistent with small distal common duct stones. Concurrent cholelithiasis, as on CT.  3.  Findings of chronic pancreatitis, without definite acute superimposed process. 4.  Marked hepatic steatosis. 5.  Left base air space disease and adjacent  fluid, as before. 6.  Renal and perirenal edema with suggestion of decreased contrast excretion on delayed images.  Question acute or subacute renal insufficiency.  Original Report Authenticated By: Consuello Bossier, M.D.    IMPRESSION:  #64 51 year old female alcoholic admitted yesterday with productive cough, weakness and complaints of nausea and vomiting.  Suspect all of her acute symptoms are secondary to her fairly extensive pneumonia involving left upper lobe and left lower lobe. #2 chronic calcific pancreatitis by CT and MRCP certainly can be a cause for any ongoing complaint of abdominal pain #3 possible choledocholithiasis by MRCP. The quality of the study was poor, patient has not had any evidence of common bile duct obstruction by MRCP or labs though she may have incidental common bile duct stones #4 cholelithiasis without evidence for cholecystitis #5 macrocytic anemia #6 thrombocytopenia #7 history of remote breast cancer #8 probable impending EtOH withdrawal-patient initiated on withdrawal protocol  PLAN: Start PPI once daily, consider adding pancreatic enzyme supplements before meals when she is taking by mouth  Follow serial LFTs Patient needs to be treated for her pneumonia and resolve any EtOH withdrawal, then will reevaluate next week regarding eventual possible ERCP. GI will see again, next week.   Amy Esterwood  03/01/2012, 2:58 PM

## 2012-03-01 NOTE — Progress Notes (Signed)
Family Medicine Teaching Service Daily Progress Note Intern Pager: [phone]  Patient name: [name] Medical record number: [id number] Date of birth: 10/24/1960 Age: 51 y.o. Gender: female  Primary Care Provider: MATTHEWS,CODY, DO  Subjective: Pt seen at bedside. States she feels about the same, still with abd pain and some nausea. Also short of breath with some cough this morning still, with increased O2 need.   Objective: Temp:  [97.8 F (36.6 C)-99.5 F (37.5 C)] 99.5 F (37.5 C) (07/19 0557) Pulse Rate:  [112-124] 118  (07/19 0557) Resp:  [16-20] 20  (07/19 0557) BP: (114-132)/(78-87) 132/83 mmHg (07/19 0557) SpO2:  [93 %-97 %] 96 % (07/19 0557) Weight:  [95 lb (43.092 kg)-108 lb 4.8 oz (49.125 kg)] 108 lb 4.8 oz (49.125 kg) (07/19 0557) Exam: General: lying in bed, appears uncomfortable in mild distress on 2L [employer] Cardiovascular: increased rate, no murmur appreciated Respiratory: increased work of breathing with supraclavicular retractions, decreased breath sounds worse on left, no significant appreciable wheezes Abdomen: soft, diffusely tender, worse tenderness in LUQ Extremities: moves all extremities equally, SCDs in place bilaterally, good distal pulses  Laboratory:  Lab 03/01/12 0630 02/29/12 1353  WBC 9.6 14.4*  HGB 7.4* 8.5*  HCT 21.3* 24.1*  PLT PLATELET CLUMPS NOTED ON SMEAR, COUNT APPEARS ADEQUATE 383    Lab 03/01/12 0630 02/29/12 2112 02/29/12 1353  NA 131* 129* 130*  K 3.5 2.5* 2.1*  CL 95* 89* 84*  CO2 16* 19 23  BUN 47* 58* 61*  CREATININE 0.79 0.90 1.13*  CALCIUM 6.2* 6.7* 7.2*  PROT 6.2 -- 7.5  BILITOT 1.6* -- 2.1*  ALKPHOS 66 -- 79  ALT 10 -- 12  AST 53* -- 47*  GLUCOSE 113* 142* 127*   HIV: nonreactive, 7/18  Imaging/Diagnostic Tests: CXR, 7/18 @1424  IMPRESSION:  Dense pneumonia in the left lung, probably the left upper lobe.  CT, 7/18 @2028  IMPRESSION:  Dense left upper lobe and superior segment left lower lobe  consolidation,  most compatible with pneumonia. Collapse with  proximal endobronchial lesion is felt less likely.  Gallstones with possible choledocholithiasis or volume averaging  from pancreatic calcification. Consider MRCP for further  evaluation.  Hepatic steatosis.  Trace upper abdominal ascites.  Assessment/Plan: [name] is a 51yo female with PMH significant for HTN, EtOH abuse/alcoholic hepatitis, and breast CA presenting with inability to tolerate PO with shortness of breath. Admitted from clinic on 7/18.   1. Abdominal pain/anorexia: Patient with history of EtOH abuse and EtOH induced hepatitis, now with abdominal pain, icterus and inability to tolerate PO. Broad differential including pancreatitis, hepatitis, malignancy, gallstones, gastritis. Admitted to telemetry and for hydration. CT shows LUL PNA, gallstones with possible choledocolithiasis and hepatic steatosis with trace upper abd ascites. PLAN - Cont. Zofran PRN for nausea, morphine PRN for pain. Replenishing electrolytes. MRCP ordered for today. Will also consult GI.  2. Shortness of breath/Cough: Given history of EtOH abuse, potential for aspiration. DDx also includes PE. Subjective fevers at home. CT on admission concerning for LUL PNA. Pt with increased WOB on 7/19, WBC improved. PLAN - Zosyn per pharmacy given likelihood of aspiration PNA. Supplemental O2. Lactic acid pending.  3. EtOH abuse: Last drink >7 days ago. Unlikely to withdraw at this point if she has truly not had anything to drink more recently. Pt with some elevated BP, temp, tachycardia, O2 requirement on 7/19. PLAN - CIWA protocol. Multivitamin, folic acid, thiamine.  4. Tachycardia: Initially believed likely 2/2 to dehydration, although as above PE  has to be kept in differential if there is underlying malignancy. Also possible EtOH withdrawal. Continue IVF, monitor.  5. Depression: Has expressed passive SI in the past as well as in clinic on 7/18. No plan vocalized.  Will continue home SSRI and monitor closely.   6. HTN: Slightly low BP on admission, anti-hypertensives were held. Will restart at or before discharge pending resolution of other problems.   FEN/GI: NPO for now, NS @ 163mL/hr. MVI with thiamine and folic acid  PPX: SCD's until PT/INR and platelets return, Protonix.  Dispo: Pending further work up  Bobbye Morton, MD PGY-1, [facility] FPTS Intern pager: [phone]

## 2012-03-01 NOTE — Progress Notes (Addendum)
INITIAL ADULT NUTRITION ASSESSMENT Date: 03/01/2012   Time: 1:04 PM  INTERVENTION:  Add Resource Breeze supplement 3 times daily (250 kcals, 9 gm protein per 8 fl oz carton)  Continue MVI, folic acid & thiamine daily RD to follow for nutrition care plan  Reason for Assessment: Consult; Nutrition Risk Report  ASSESSMENT: Female 51 y.o.  Dx: SOB, abdominal pain  Hx:  Past Medical History  Diagnosis Date  . Hypertension   . Alcoholic hepatitis   . Alcoholism   . Breast cancer 2001    Rt side. Had lumpectomy, chemo and XRT by [facility]  . Shortness of breath     Related Meds:     . azithromycin  500 mg Oral Daily  . folic acid  1 mg Oral Daily  . magnesium sulfate 1 - 4 g bolus IVPB  2 g Intravenous Once  . magnesium sulfate 1 - 4 g bolus IVPB  4 g Intravenous Once  . magnesium sulfate 1 - 4 g bolus IVPB  4 g Intravenous Once  . multivitamin with minerals  1 tablet Oral Daily  . pantoprazole (PROTONIX) IV  40 mg Intravenous QHS  . piperacillin-tazobactam (ZOSYN)  IV  3.375 g Intravenous Q8H  . potassium chloride  10 mEq Intravenous Q1H  . potassium chloride  10 mEq Intravenous Q1 Hr x 5  . general admission iv infusion   Intravenous Once  . sodium chloride  500 mL Intravenous Once  . sodium chloride  3 mL Intravenous Q12H  . thiamine  100 mg Oral Daily   Or  . thiamine  100 mg Intravenous Daily  . venlafaxine XR  150 mg Oral Daily  . DISCONTD: potassium chloride  10 mEq Intravenous Q1 Hr x 4  . DISCONTD: potassium chloride  40 mEq Oral Daily    Ht: 5\' 7"  (170.2 cm)  Wt: 108 lb 4.8 oz (49.125 kg)  Ideal Wt: 61.3 kg % Ideal Wt: 80%  Usual Wt: 103 lb -- per office visit record June 2013 % Usual Wt: 96%  BMI = 16.9 kg/m2  Food/Nutrition Related Hx: patient appears severely malnourished per admission nutrition screen  Labs:  CMP     Component Value Date/Time   NA 131* 03/01/2012 0630   K 3.5 03/01/2012 0630   CL 95* 03/01/2012 0630   CO2 16* 03/01/2012 0630   GLUCOSE 113* 03/01/2012 0630   BUN 47* 03/01/2012 0630   CREATININE 0.79 03/01/2012 0630   CREATININE 0.89 12/15/2011 1210   CALCIUM 6.2* 03/01/2012 0630   PROT 6.2 03/01/2012 0630   ALBUMIN 1.2* 03/01/2012 0630   AST 53* 03/01/2012 0630   ALT 10 03/01/2012 0630   ALKPHOS 66 03/01/2012 0630   BILITOT 1.6* 03/01/2012 0630   GFRNONAA >90 03/01/2012 0630   GFRAA >90 03/01/2012 0630     Intake/Output Summary (Last 24 hours) at 03/01/12 1310 Last data filed at 03/01/12 0951  Gross per 24 hour  Intake      3 ml  Output    550 ml  Net   -547 ml     Diet Order: Clear Liquid  Supplements/Tube Feeding: MVI daily  IVF:    sodium chloride Last Rate: 125 mL/hr at 03/01/12 0037    Estimated Nutritional Needs:   Kcal: 1400-1600 Protein: 60-70 gm Fluid: 1.-1.6 L  Patient admitted for SOB, cough and inability to tolerate PO's for the past week; reports a poor appetite since after July 4th holiday; RD feels patient's usual intake PTA is  suboptimal given severe depression/anxiety; + nausea and vomiting; per office visit records, her weight has fluctuated for the past several months; noted hx of ETOH abuse; patient with visible wasting to temples and upper body; would benefit from addition of nutrition supplements -- RD to order.  Patient meets criteria for severe malnutrition in the context of chronic illness gviven < 75% intake of energy requirement for > 1 month & severe muscle loss  NUTRITION DIAGNOSIS: -Inadequate protein energy intake (NI-5.3).  Status: Ongoing  RELATED TO: clear liquids  AS EVIDENCE BY: diet provision: ~ 1,000 kcals, 0 gm protein  MONITORING/EVALUATION(Goals): Goal: Oral intake & supplements to meet >/= 90% of estimated nutrition needs Monitor: PO intake, weight, labs, I/O's  EDUCATION NEEDS: -No education needs identified at this time  Dietitian #: [phone]  DOCUMENTATION CODES Per approved criteria  -Severe malnutrition in the context of chronic  illness -Underweight    Alger Memos 03/01/2012, 1:04 PM

## 2012-03-01 NOTE — Progress Notes (Signed)
Seen and examined.  Agree with Dr. Casper Harrison.  Briefly, 51 yo female with: 1. Pneumonia: upper lobe and at risk for aspiration.  Also, moderate immunocompromised from EtOHism.  Agree with zosyn.  While atypicals are unlikely, consider adding levoquin or azithro 2. Dehydration plus mult electrolyte abnormalities.  Contributing is likely underlying liver disease.  We should check Mg++ 3. Protein calorie malnutrition.  Again a combo of poor intake and her alcoholism/liver disease. 4. Dispo:  Weak, shakey, needs PT and strengthening. 5. Alcoholism: states last drink two weeks ago.  Agree with CIWA.

## 2012-03-01 NOTE — H&P (Signed)
FMTS Attending Admit Note Patient seen and examined by me, please see my separate attending admit note. JB

## 2012-03-01 NOTE — Consult Note (Signed)
Patient seen, examined, and I agree with the above documentation, including the assessment and plan. Patient admitted with pneumonia, currently being treated. 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 There is a question of distal CBD stone by MRCP, with the knowledge that this examination was degraded by motion artifact. Her LFT pattern is not consistent with obstruction at this time. Agree with addressing acute issues including pneumonia, high risk of alcohol draw, and anemia first. Follow hepatic function panel daily We will reevaluate early next week regarding the need for ERCP

## 2012-03-01 NOTE — Evaluation (Signed)
Physical Therapy Evaluation Patient Details Name: [name] MRN: [id number] DOB: [birth year] Today's Date: 03/01/2012 Time: 6962-9528 PT Time Calculation (min): 25 min  PT Assessment / Plan / Recommendation Clinical Impression  Patient presented to hopsital secondary to ETOH abuse and abdominal pain/anorexia - work up is in progress. Patient presents with wet cough post pills and PO intake, she is also anorexic and subjectively reporting difficulty swallowing. I would highly recommend a SLP eval for swallowing. We will follow acutely for PT to maximize her functional moblity and decrease the burden of care on her friend at discharge.     PT Assessment  Patient needs continued PT services    Follow Up Recommendations  Home health PT;Supervision/Assistance - 24 hour    Barriers to Discharge  None      Equipment Recommendations   (TBA)    Recommendations for Other Services  SLP for swallow evaluation.    Frequency Min 3X/week    Precautions / Restrictions Precautions Precautions: Fall   Pertinent Vitals/Pain Drop in SpO2 to 87% on room air with gait.       Mobility  Bed Mobility Bed Mobility: Supine to Sit;Sitting - Scoot to Edge of Bed;Sit to Supine Supine to Sit: 5: Supervision;HOB elevated Sitting - Scoot to Edge of Bed: 5: Supervision Sit to Supine: 5: Supervision Details for Bed Mobility Assistance: Increased time for task completion - needs cues to scoot to edge of the bed prior to stand as too far from edge for best initiation. Transfers Transfers: Sit to Stand;Stand to Sit Sit to Stand: 4: Min guard;With upper extremity assist;From bed Stand to Sit: 4: Min guard;With upper extremity assist;To bed Details for Transfer Assistance: Close guarding for safety.  Ambulation/Gait Ambulation/Gait Assistance: 4: Min guard Ambulation Distance (Feet): 25 Feet Assistive device: None Ambulation/Gait Assistance Details: Declines ambulating out of room. DOE 2/4 with brief  drop in SpO2 to 87% on room air with gait. Oxygen re-applied.  Gait Pattern: Step-through pattern;Decreased stride length     PT Diagnosis: Difficulty walking;Generalized weakness;Acute pain  PT Problem List: Decreased activity tolerance;Decreased mobility;Pain;Decreased cognition;Decreased strength;Cardiopulmonary status limiting activity PT Treatment Interventions: DME instruction;Gait training;Therapeutic activities;Therapeutic exercise;Balance training;Patient/family education   PT Goals Acute Rehab PT Goals PT Goal Formulation: With patient Time For Goal Achievement: 03/08/12 Potential to Achieve Goals: Good Pt will go Sit to Stand: with modified independence;with upper extremity assist PT Goal: Sit to Stand - Progress: Goal set today Pt will go Stand to Sit: with modified independence;with upper extremity assist PT Goal: Stand to Sit - Progress: Goal set today Pt will Ambulate: >150 feet;with modified independence PT Goal: Ambulate - Progress: Goal set today Pt will Perform Home Exercise Program: with supervision, verbal cues required/provided PT Goal: Perform Home Exercise Program - Progress: Goal set today  Visit Information  Last PT Received On: 03/01/12 Assistance Needed: +1    Subjective Data  Subjective: Patient reported to nurse that it would take 30 minutes to swallow her pills.  Patient Stated Goal: Get well   Prior Functioning  Home Living Lives With: Friend(s) Available Help at Discharge: Friend(s);Available PRN/intermittently Type of Home: House Home Access: Level entry Home Layout: One level Bathroom Shower/Tub: Engineer, manufacturing systems: Standard Home Adaptive Equipment: None Prior Function Level of Independence: Independent Able to Take Stairs?: Yes Driving: No Vocation: Self employed Comments: Lexicographer houses Communication Communication: Expressive difficulties (Speech unclear.)    Cognition  Overall Cognitive Status: Impaired Area of  Impairment: Attention Arousal/Alertness: Awake/alert Orientation Level: Appears  intact for tasks assessed Current Attention Level: Sustained Attention - Other Comments: Patient easily distracted and slow to process information.     Extremity/Trunk Assessment Right Lower Extremity Assessment RLE ROM/Strength/Tone: Deficits RLE ROM/Strength/Tone Deficits: Grossly 4/5  - limited effort RLE Sensation:  (Nothing abnormal reported) RLE Coordination: WFL - gross/fine motor Left Lower Extremity Assessment LLE ROM/Strength/Tone: Deficits LLE ROM/Strength/Tone Deficits: Grossly 4/5 - limited effort LLE Sensation:  (Nothing abnormal reported) LLE Coordination: WFL - gross/fine motor Trunk Assessment Trunk Assessment: Normal   Balance Balance Balance Assessed: Yes Static Standing Balance Static Standing - Balance Support: No upper extremity supported Static Standing - Level of Assistance: 5: Stand by assistance  End of Session PT - End of Session Equipment Utilized During Treatment: Gait belt Activity Tolerance: Patient limited by fatigue Patient left: in bed;with call bell/phone within reach;with bed alarm set Nurse Communication: Mobility status (Wet cough post pills)   Edwyna Perfect, PT  Pager [phone]  03/01/2012, 11:02 AM

## 2012-03-01 NOTE — Progress Notes (Signed)
While ambulating to the restroom around 0600 pt's O2 sats dropped to  88%. Pt was asked to take several deep breaths but her O2 sats did not come up. Pt was placed on 2L O2 via [place]. MD on call notified. Will continue to monitor the pt. Sanda Linger

## 2012-03-01 NOTE — Progress Notes (Signed)
Utilization review completed.  

## 2012-03-01 NOTE — Progress Notes (Signed)
CRITICAL VALUE ALERT  Critical value received:  Hgb 6.9  Date of notification:  03/01/12  Time of notification:  1740  Critical value read back:yes  Nurse who received alert:  Lethea Killings  MD notified (1st page):  Dr T.  Time of first page:  1751  MD notified (2nd page):  Time of second page:  Responding MD: Dr Karie Schwalbe  Time MD responded: [phone]

## 2012-03-02 LAB — CBC
HCT: 21.8 % — ABNORMAL LOW (ref 36.0–46.0)
HCT: 19.6 % — ABNORMAL LOW (ref 36.0–46.0)
Hemoglobin: 7.6 g/dL — ABNORMAL LOW (ref 12.0–15.0)
MCHC: 34.9 g/dL (ref 30.0–36.0)
MCHC: 36.2 g/dL — ABNORMAL HIGH (ref 30.0–36.0)
MCV: 98.6 fL (ref 78.0–100.0)
Platelets: 325 10*3/uL (ref 150–400)
RDW: 15.4 % (ref 11.5–15.5)
RDW: 15.6 % — ABNORMAL HIGH (ref 11.5–15.5)
WBC: 20.6 10*3/uL — ABNORMAL HIGH (ref 4.0–10.5)
WBC: 22.9 10*3/uL — ABNORMAL HIGH (ref 4.0–10.5)

## 2012-03-02 LAB — IRON AND TIBC: UIBC: 68 ug/dL — ABNORMAL LOW (ref 125–400)

## 2012-03-02 LAB — COMPREHENSIVE METABOLIC PANEL
ALT: 14 U/L (ref 0–35)
AST: 60 U/L — ABNORMAL HIGH (ref 0–37)
CO2: 19 mEq/L (ref 19–32)
Calcium: 6.5 mg/dL — ABNORMAL LOW (ref 8.4–10.5)
Chloride: 102 mEq/L (ref 96–112)
GFR calc Af Amer: >90 mL/min (ref >90)
GFR calc non Af Amer: >90 mL/min (ref >90)
Glucose, Bld: 75 mg/dL (ref 70–99)
Sodium: 137 mEq/L (ref 135–145)
Total Bilirubin: 1.6 mg/dL — ABNORMAL HIGH (ref 0.3–1.2)

## 2012-03-02 LAB — MAGNESIUM: Magnesium: 1.7 mg/dL (ref 1.5–2.5)

## 2012-03-02 LAB — OCCULT BLOOD X 1 CARD TO LAB, STOOL: Fecal Occult Bld: NEGATIVE

## 2012-03-02 LAB — RETICULOCYTES
RBC.: 2 MIL/uL — ABNORMAL LOW (ref 3.87–5.11)
Retic Count, Absolute: 18 10*3/uL — ABNORMAL LOW (ref 19.0–186.0)
Retic Ct Pct: 0.9 % (ref 0.4–3.1)

## 2012-03-02 MED ORDER — POTASSIUM CHLORIDE CRYS ER 20 MEQ PO TBCR
30.0000 meq | EXTENDED_RELEASE_TABLET | Freq: Two times a day (BID) | ORAL | Status: AC
  Administered 2012-03-02 – 2012-03-03 (×2): 30 meq via ORAL
  Filled 2012-03-02 (×2): qty 1

## 2012-03-02 MED ORDER — TUBERCULIN PPD 5 UNIT/0.1ML ID SOLN
5.0000 [IU] | Freq: Once | INTRADERMAL | Status: DC
  Filled 2012-03-02: qty 0.1

## 2012-03-02 MED ORDER — TUBERCULIN PPD 5 UNIT/0.1ML ID SOLN
5.0000 [IU] | Freq: Once | INTRADERMAL | Status: AC
  Administered 2012-03-02: 5 [IU] via INTRADERMAL
  Filled 2012-03-02: qty 0.1

## 2012-03-02 MED ORDER — FERROUS SULFATE 325 (65 FE) MG PO TABS
325.0000 mg | ORAL_TABLET | Freq: Two times a day (BID) | ORAL | Status: DC
  Administered 2012-03-03 – 2012-03-04 (×3): 325 mg via ORAL
  Filled 2012-03-02 (×7): qty 1

## 2012-03-02 MED ORDER — TUBERCULIN PPD 5 UNIT/0.1ML ID SOLN: 5.0000 [IU] | Freq: Once | INTRADERMAL | Status: DC

## 2012-03-02 NOTE — Progress Notes (Signed)
Family Medicine Teaching Service Daily Progress Note Intern Pager: [phone]  Patient name: [name] Medical record number: [id number] Date of birth: [birth year] Age: 51 y.o. Gender: female  Primary Care Provider: MATTHEWS,CODY, DO  Subjective: Pt seen at bedside. SOB remains, especially with ambulation to bathroom. Some pain in abdomen, chest with breathing/coughing. Nauseated.  Objective: Temp:  [98.7 F (37.1 C)-98.8 F (37.1 C)] 98.7 F (37.1 C) (07/20 0300) Pulse Rate:  [109-124] 109  (07/20 0300) Resp:  [20] 20  (07/20 0300) BP: (126-137)/(84-89) 132/88 mmHg (07/20 0410) SpO2:  [87 %-99 %] 99 % (07/20 0410) Weight:  [111 lb 12.8 oz (50.712 kg)] 111 lb 12.8 oz (50.712 kg) (07/20 0500) Exam: General: lying in bed, appears uncomfortable, mild tremor, on 2L [place] Cardiovascular: increased rate, no murmur appreciated Respiratory: increased work of breathing though retractions not as bad as 7/19, decreased breath sounds worse on left, no significant appreciable wheezes Abdomen: soft, diffusely tender, worse tenderness in epigastrium and LUQ Extremities: moves all extremities equally, good distal pulses  Laboratory:  Lab 03/02/12 0720 03/01/12 2044 03/01/12 1616  WBC 20.6* 14.5* 14.5*  HGB 7.6* 6.8* 6.9*  HCT 21.8* 19.2* 19.6*  PLT 375 328 335    Lab 03/02/12 0725 03/01/12 1616 03/01/12 0630 02/29/12 1353  NA 137 131* 131* --  K 2.8* 4.0 3.5 --  CL 102 97 95* --  CO2 19 17* 16* --  BUN 28* 39* 47* --  CREATININE 0.66 0.72 0.79 --  CALCIUM 6.5* 6.3* 6.2* --  PROT 6.3 -- 6.2 7.5  BILITOT 1.6* -- 1.6* 2.1*  ALKPHOS 84 -- 66 79  ALT 14 -- 10 12  AST 60* -- 53* 47*  GLUCOSE 75 128* 113* --   HIV: nonreactive, 7/18  Imaging/Diagnostic Tests: CXR, 7/18 @1424  IMPRESSION:  Dense pneumonia in the left lung, probably the left upper lobe.  CT, 7/18 @2028  IMPRESSION:  Dense left upper lobe and superior segment left lower lobe  consolidation, most compatible with  pneumonia. Collapse with  proximal endobronchial lesion is felt less likely.  Gallstones with possible choledocholithiasis or volume averaging  from pancreatic calcification. Consider MRCP for further  evaluation.  Hepatic steatosis.  Trace upper abdominal ascites.  MRCP, 7/19 @1243  IMPRESSION:  1. Moderate to severely degraded exam, secondary to motion and  inability to hold breath.  2. No biliary ductal dilatation. Distal common duct findings,  which when combined with CT findings, are most consistent with  small distal common duct stones. Concurrent cholelithiasis, as on CT.  3. Findings of chronic pancreatitis, without definite acute  superimposed process.  4. Marked hepatic steatosis.  5. Left base air space disease and adjacent fluid, as before.  6. Renal and perirenal edema with suggestion of decreased contrast  excretion on delayed images. Question acute or subacute renal  insufficiency.  Assessment/Plan: [name] is a 51yo female with PMH significant for HTN, EtOH abuse/alcoholic hepatitis, and breast CA presenting with inability to tolerate PO with shortness of breath. Admitted from clinic on 7/18.   1. Abdominal pain/anorexia: Patient with history of EtOH abuse and EtOH induced hepatitis, now with abdominal pain, icterus and inability to tolerate PO. Broad differential including pancreatitis, hepatitis, malignancy, gallstones, gastritis. Admitted to telemetry and for hydration. CT shows LUL PNA, gallstones with possible choledocolithiasis and hepatic steatosis with trace upper abd ascites. MRCP on 7/19 is a generally poor study, shows current cholelithiasis and chronic pancreatitis and nonspecific distal common duct findings. Required replenishment of K and Mg,  7/18-19. K still low, 7/19. PLAN - Protonix daily. Cont. Zofran PRN for nausea, morphine PRN for pain. Continue to replenish electrolytes. GI assistance appreciated; per their note 7/19, recommended possible  pancreatic enzyme supplements and they will possibly do ERCP next week.  2. Shortness of breath/Cough: Given history of EtOH abuse and CXR, CT findings, most likely aspiration PNA of LUL. DDx also includes PE, but less likely. Subjective fevers at home, none here so far this admission. Pt still with increased WOB, WBC trending up. PLAN - Zosyn per pharmacy given likelihood of aspiration PNA. Azithromycin started on 7/19 for 3 days to cover atypicals. Supplemental O2  3. Anemia - Hb 11.0 in April 2013, 8.5 on admission this time. Trended down to 6.9 on 7/19. Now 7.6 on 7/20. Most likely secondary to EtOH abuse; MCV near upper limit of normal with one measurement 101.6. Stools are hemoccult negative, no bloody emesis. Possible iron deficiency as well given general poor nutrition. Pt typed and screened on 7/19, expires 7/22. PLAN - Continue multivitamin, folate, thiamine. Will consider iron panel and/or empiric iron therapy. Will also consider transfusion if pt becomes more symptomatic.  4. EtOH abuse: Last drink >7 days ago. Unlikely to withdraw at this point if she has truly not had anything to drink more recently. Pt with some elevated BP, temp, tachycardia, O2 requirement on 7/19, remains on 7/20. PLAN - CIWA protocol. Multivitamin, folic acid, thiamine.  5. Tachycardia: Initially believed likely 2/2 to dehydration, although as above PE is possible but less likely. Also possible EtOH withdrawal. Continue IVF, monitor.  6. Depression: Has expressed passive SI in the past as well as in clinic on 7/18. No plan vocalized. Will continue home SSRI and monitor closely.   7. HTN: Slightly low BP on admission, anti-hypertensives were held. Will restart at or before discharge pending resolution of other problems. BP around 130's/80's currently. Will monitor.  FEN/GI: clear liquid diet, NS @ 113mL/hr. MVI with thiamine and folic acid  PPX: SCD's until PT/INR and platelets return, Protonix.  Dispo: Pending  further work up  Bobbye Morton, MD PGY-1, [facility] FPTS Intern pager: [phone]

## 2012-03-02 NOTE — Progress Notes (Signed)
Seen and examined.  Patient with cough and weakness.  Agree with Dr. Casper Harrison.  Additionally, while bacterial pneumonia is most likely, we should consider TB in our differential.  Will place PPD and isolate until PPD neg.

## 2012-03-03 ENCOUNTER — Inpatient Hospital Stay (HOSPITAL_COMMUNITY): Payer: Self-pay

## 2012-03-03 LAB — COMPREHENSIVE METABOLIC PANEL
ALT: 17 U/L (ref 0–35)
AST: 55 U/L — ABNORMAL HIGH (ref 0–37)
Alkaline Phosphatase: 101 U/L (ref 39–117)
CO2: 17 mEq/L — ABNORMAL LOW (ref 19–32)
Calcium: 6.6 mg/dL — ABNORMAL LOW (ref 8.4–10.5)
GFR calc non Af Amer: >90 mL/min (ref >90)
Potassium: 3 mEq/L — ABNORMAL LOW (ref 3.5–5.1)
Sodium: 136 mEq/L (ref 135–145)

## 2012-03-03 LAB — CBC
Hemoglobin: 8.4 g/dL — ABNORMAL LOW (ref 12.0–15.0)
MCH: 34.9 pg — ABNORMAL HIGH (ref 26.0–34.0)
Platelets: 424 10*3/uL — ABNORMAL HIGH (ref 150–400)
RBC: 2.41 MIL/uL — ABNORMAL LOW (ref 3.87–5.11)
WBC: 24.4 10*3/uL — ABNORMAL HIGH (ref 4.0–10.5)

## 2012-03-03 LAB — FERRITIN: Ferritin: 2724 ng/mL — ABNORMAL HIGH (ref 10–291)

## 2012-03-03 MED ORDER — MAGNESIUM OXIDE 400 (241.3 MG) MG PO TABS
400.0000 mg | ORAL_TABLET | Freq: Two times a day (BID) | ORAL | Status: DC
  Administered 2012-03-03 – 2012-03-10 (×14): 400 mg via ORAL
  Filled 2012-03-03 (×16): qty 1

## 2012-03-03 MED ORDER — VANCOMYCIN HCL 500 MG IV SOLR
500.0000 mg | Freq: Two times a day (BID) | INTRAVENOUS | Status: DC
  Administered 2012-03-04 – 2012-03-06 (×5): 500 mg via INTRAVENOUS
  Filled 2012-03-03 (×9): qty 500

## 2012-03-03 MED ORDER — ENOXAPARIN SODIUM 30 MG/0.3ML ~~LOC~~ SOLN
30.0000 mg | SUBCUTANEOUS | Status: DC
  Administered 2012-03-03 – 2012-03-05 (×3): 30 mg via SUBCUTANEOUS
  Filled 2012-03-03 (×3): qty 0.3

## 2012-03-03 MED ORDER — HYDROCHLOROTHIAZIDE 12.5 MG PO CAPS
12.5000 mg | ORAL_CAPSULE | Freq: Every day | ORAL | Status: DC
  Administered 2012-03-03 – 2012-03-09 (×7): 12.5 mg via ORAL
  Filled 2012-03-03 (×8): qty 1

## 2012-03-03 MED ORDER — POTASSIUM CHLORIDE CRYS ER 20 MEQ PO TBCR
40.0000 meq | EXTENDED_RELEASE_TABLET | Freq: Two times a day (BID) | ORAL | Status: AC
  Administered 2012-03-03 – 2012-03-05 (×5): 40 meq via ORAL
  Filled 2012-03-03 (×6): qty 2
  Filled 2012-03-03: qty 1

## 2012-03-03 NOTE — Progress Notes (Signed)
Seen and examined.  Discussed with Dr. Casper Harrison.  Agree with his management.  [name] is not thriving with continued DTs and a significant multilobar pneumonia.  In addition to repleting potassium, we broadened antibiotic coverage by adding vanc.  I suspect this will just take time.  I would get PT involved once off isolation.

## 2012-03-03 NOTE — Progress Notes (Signed)
ANTIBIOTIC CONSULT NOTE - INITIAL  Pharmacy Consult for Vancomycin Indication: rule out pneumonia  No Known Allergies  Patient Measurements: Height: 5\' 7"  (170.2 cm) Weight: 109 lb 2 oz (49.5 kg) (scale C) IBW/kg (Calculated) : 61.6    Vital Signs: Temp: 98.6 F (37 C) (07/21 1325) Temp src: Axillary (07/21 1325) BP: 136/101 mmHg (07/21 1325) Pulse Rate: 120  (07/21 1325) Intake/Output from previous day: 07/20 0701 - 07/21 0700 In: 1773 [P.O.:720; I.V.:1003; IV Piggyback:50] Out: 307 [Urine:302; Stool:5] Intake/Output from this shift:    Labs:  Basename 03/03/12 1014 03/03/12 1013 03/02/12 1810 03/02/12 0725 03/02/12 0720 03/01/12 1616  WBC 24.4* -- 22.9* -- 20.6* --  HGB 8.4* -- 7.1* -- 7.6* --  PLT 424* -- 325 -- 375 --  LABCREA -- -- -- -- -- --  CREATININE -- 0.56 -- 0.66 -- 0.72   Estimated Creatinine Clearance: 66.5 ml/min (by C-G formula based on Cr of 0.56). No results found for this basename: VANCOTROUGH:2,VANCOPEAK:2,VANCORANDOM:2,GENTTROUGH:2,GENTPEAK:2,GENTRANDOM:2,TOBRATROUGH:2,TOBRAPEAK:2,TOBRARND:2,AMIKACINPEAK:2,AMIKACINTROU:2,AMIKACIN:2, in the last 72 hours   Microbiology: No results found for this or any previous visit (from the past 720 hour(s)).  Medical History: Past Medical History  Diagnosis Date  . Hypertension   . Alcoholic hepatitis   . Alcoholism   . Breast cancer 2001    Rt side. Had lumpectomy, chemo and XRT by [facility]  . Shortness of breath     Assessment: 51 yo lady admitted with SOB and cough on zosyn 3.375 iv q8h  for r/o aspiration PNA vs CAP. Her SrCr is 0.56 to be started on vancomycin.   Goal of Therapy:  Vancomycin trough level 15-20 mcg/ml  Plan:  Vancomycin 500 mg iv q12h Levels at steady state  Lucille Passy 03/03/2012,2:10 PM

## 2012-03-03 NOTE — Progress Notes (Signed)
Received pt to rm [id number] from [phone]. Oriented to room, call bell placed within reach. Pt on airborne precaution. Will monitor.

## 2012-03-03 NOTE — Progress Notes (Signed)
Family Medicine Teaching Service Daily Progress Note Intern Pager: [phone]  Patient name: [name] Medical record number: [id number] Date of birth: February 13, 1961 Age: 51 y.o. Gender: female  Primary Care Provider: MATTHEWS,CODY, DO  Subjective: Pt seen at bedside. SOB remains, pt slightly more confused this morning than previous days. Answers questions appropriately, appears oriented but somewhat difficult to understand at times (rapid speech, mumbling, etc). Pt did receive one dose of Ativan per CIWA protocol about 1-1.5 hours before my visit.  Objective: Temp:  [98.8 F (37.1 C)-99.1 F (37.3 C)] 99.1 F (37.3 C) (07/21 0603) Pulse Rate:  [102-127] 127  (07/21 0603) Resp:  [18-22] 22  (07/21 0603) BP: (130-173)/(82-105) 159/105 mmHg (07/21 0603) SpO2:  [97 %-99 %] 97 % (07/21 0603) Weight:  [108 lb 7.5 oz (49.2 kg)-109 lb 2 oz (49.5 kg)] 109 lb 2 oz (49.5 kg) (07/21 0603) Exam: General: lying in bed, appears uncomfortable, mild tremor, on RA Cardiovascular: increased rate, no murmur appreciated Respiratory: increased work of breathing though retractions not as bad as 7/19, decreased breath sounds diffusely, some coarse sounds worse on left than right Abdomen: soft, diffusely tender but improved from previous days; still more markedly tender in LUQ Extremities: moves all extremities equally, good distal pulses  Laboratory:  Lab 03/02/12 1810 03/02/12 0720 03/01/12 2044  WBC 22.9* 20.6* 14.5*  HGB 7.1* 7.6* 6.8*  HCT 19.6* 21.8* 19.2*  PLT 325 375 328    Lab 03/02/12 0725 03/01/12 1616 03/01/12 0630 02/29/12 1353  NA 137 131* 131* --  K 2.8* 4.0 3.5 --  CL 102 97 95* --  CO2 19 17* 16* --  BUN 28* 39* 47* --  CREATININE 0.66 0.72 0.79 --  CALCIUM 6.5* 6.3* 6.2* --  PROT 6.3 -- 6.2 7.5  BILITOT 1.6* -- 1.6* 2.1*  ALKPHOS 84 -- 66 79  ALT 14 -- 10 12  AST 60* -- 53* 47*  GLUCOSE 75 128* 113* --   HIV: nonreactive, 7/18  Imaging/Diagnostic Tests: CXR, 7/18  @1424  IMPRESSION:  Dense pneumonia in the left lung, probably the left upper lobe.  CT, 7/18 @2028  IMPRESSION:  Dense left upper lobe and superior segment left lower lobe  consolidation, most compatible with pneumonia. Collapse with  proximal endobronchial lesion is felt less likely.  Gallstones with possible choledocholithiasis or volume averaging  from pancreatic calcification. Consider MRCP for further  evaluation.  Hepatic steatosis.  Trace upper abdominal ascites.  MRCP, 7/19 @1243  IMPRESSION:  1. Moderate to severely degraded exam, secondary to motion and  inability to hold breath.  2. No biliary ductal dilatation. Distal common duct findings,  which when combined with CT findings, are most consistent with  small distal common duct stones. Concurrent cholelithiasis, as on CT.  3. Findings of chronic pancreatitis, without definite acute  superimposed process.  4. Marked hepatic steatosis.  5. Left base air space disease and adjacent fluid, as before.  6. Renal and perirenal edema with suggestion of decreased contrast  excretion on delayed images. Question acute or subacute renal  Insufficiency.  CXR, 7/21 IMPRESSION:  1. Significantly worsening aeration throughout the left lung,  compatible with progressive multilobar pneumonia.  2. New small bilateral pleural effusions.  3. Atherosclerosis.   Assessment/Plan: [name] is a 51yo female with PMH significant for HTN, EtOH abuse/alcoholic hepatitis, and breast CA presenting with inability to tolerate PO with shortness of breath. Admitted from clinic on 7/18.   1. Abdominal pain/anorexia: Patient with history of EtOH abuse and  EtOH induced hepatitis, now with abdominal pain, icterus and inability to tolerate PO. Broad differential including pancreatitis, hepatitis, malignancy, gallstones, gastritis. Admitted to telemetry and for hydration. CT shows LUL PNA, gallstones with possible choledocolithiasis and hepatic  steatosis with trace upper abd ascites. MRCP on 7/19 is a generally poor study, shows current cholelithiasis and chronic pancreatitis and nonspecific distal common duct findings. Required replenishment of K and Mg, 7/18-19. K still low, 7/19. Pt with generally improving abdominal pain as of 7/21, though still present in LUQ. PLAN - Protonix daily. Cont. Zofran PRN for nausea, morphine PRN for pain. CMP from this AM pending, will replenish electrolytes as appropriate. GI assistance appreciated; per their note 7/19, recommended possible pancreatic enzyme supplements and they will possibly do ERCP next week.  2. Shortness of breath/Cough: Given history of EtOH abuse and CXR, CT findings, most likely aspiration PNA of LUL. DDx also includes PE, but less likely. Subjective fevers at home, none here so far this admission. Pt still with increased WOB, WBC trending up as of 7/20. AM labs for 7/21 pending. Given appearance, TB considered in DDx.  PLAN - Zosyn per pharmacy given likelihood of aspiration PNA. Azithromycin started on 7/19 for 3 days to cover atypicals. Will add on Vancomycin. Supplemental O2 as needed. PPD placed 7/20, to be read 7/22 ~1445.  3. Anemia - Hb 11.0 in April 2013, 8.5 on admission this time. Trended down to 6.9 on 7/19. Now 7.6 on 7/20. Most likely mixed vitamin/iron deficiencies secondary to EtOH abuse and general poor PO intake; MCV near upper limit of normal with one measurement 101.6, and iron panel on 7/21 is consistent with iron deficiency. Stools are hemoccult negative, no bloody emesis. Possible iron deficiency as well given general poor nutrition. Pt typed and screened on 7/19, expires 7/22.  PLAN - Continue multivitamin, folate, thiamine. Oral iron started 7/21. Will also consider transfusion if pt becomes more symptomatic or if Hb drops below 6.  4. EtOH abuse: Last drink >7 days ago. Unlikely to withdraw at this point if she has truly not had anything to drink more recently. Pt  with some elevated BP, temp, tachycardia, O2 requirement on 7/19. Elevated BP and tachycardia remains on 7/20 and 7/21, one dose of Ativan at 0700. PLAN - Continue CIWA protocol, renewed 7/21. Multivitamin, folic acid, thiamine.  5. Tachycardia: Initially believed likely 2/2 to dehydration, although as above PE is possible but less likely. Also possible EtOH withdrawal. Continue IVF, monitor.  6. Depression: Has expressed passive SI in the past as well as in clinic on 7/18. No plan vocalized. Will continue home SSRI and monitor closely.   7. HTN: Slightly low BP on admission, anti-hypertensives were held. Will restart at or before discharge pending resolution of other problems. BP more elevated on 7/21, 150's-170's systolic.  PLAN - Will restart home HCTZ, will add lisinopril back pending more clinical improvement.  FEN/GI: clear liquid diet, NS @ 100 mL/hr. MVI with thiamine and folic acid  PPX: Lovenox, Protonix.  Dispo: Pending further work up  Bobbye Morton, MD PGY-1, [facility] Family Medicine FPTS Intern pager: [phone]

## 2012-03-04 LAB — BASIC METABOLIC PANEL
BUN: 13 mg/dL (ref 6–23)
Chloride: 105 mEq/L (ref 96–112)
GFR calc Af Amer: >90 mL/min (ref >90)
GFR calc non Af Amer: >90 mL/min (ref >90)
Potassium: 3.1 mEq/L — ABNORMAL LOW (ref 3.5–5.1)
Sodium: 138 mEq/L (ref 135–145)

## 2012-03-04 LAB — CBC
MCHC: 34.2 g/dL (ref 30.0–36.0)
Platelets: 394 10*3/uL (ref 150–400)
RDW: 15.7 % — ABNORMAL HIGH (ref 11.5–15.5)
WBC: 19.3 10*3/uL — ABNORMAL HIGH (ref 4.0–10.5)

## 2012-03-04 MED ORDER — LORAZEPAM 0.5 MG PO TABS
1.0000 mg | ORAL_TABLET | Freq: Three times a day (TID) | ORAL | Status: DC
  Administered 2012-03-04 – 2012-03-06 (×4): 1 mg via ORAL
  Filled 2012-03-04 (×3): qty 2

## 2012-03-04 MED ORDER — LORAZEPAM 0.5 MG PO TABS
1.0000 mg | ORAL_TABLET | Freq: Four times a day (QID) | ORAL | Status: DC | PRN
  Administered 2012-03-04 – 2012-03-05 (×3): 1 mg via ORAL
  Filled 2012-03-04: qty 2
  Filled 2012-03-04: qty 1
  Filled 2012-03-04 (×2): qty 2
  Filled 2012-03-04 (×2): qty 1

## 2012-03-04 MED ORDER — FERROUS SULFATE 325 (65 FE) MG PO TABS
325.0000 mg | ORAL_TABLET | ORAL | Status: DC
  Administered 2012-03-04: 325 mg via ORAL
  Filled 2012-03-04: qty 1

## 2012-03-04 MED ORDER — FERROUS SULFATE 325 (65 FE) MG PO TABS: 325.0000 mg | ORAL_TABLET | Freq: Every day | ORAL | Status: DC

## 2012-03-04 MED ORDER — PANTOPRAZOLE SODIUM 40 MG PO TBEC
40.0000 mg | DELAYED_RELEASE_TABLET | Freq: Every day | ORAL | Status: DC
  Administered 2012-03-04 – 2012-03-15 (×12): 40 mg via ORAL
  Filled 2012-03-04 (×13): qty 1

## 2012-03-04 MED ORDER — LORAZEPAM 2 MG/ML IJ SOLN
1.0000 mg | Freq: Four times a day (QID) | INTRAMUSCULAR | Status: DC | PRN
  Administered 2012-03-04 – 2012-03-06 (×3): 1 mg via INTRAVENOUS
  Filled 2012-03-04 (×3): qty 1

## 2012-03-04 MED ORDER — LORAZEPAM BOLUS VIA INFUSION: 1.0000 mg | Freq: Three times a day (TID) | INTRAVENOUS | Status: DC

## 2012-03-04 NOTE — Progress Notes (Signed)
     [name] Daily Rounding Note 03/04/2012, 11:21 AM  SUBJECTIVE:       No nausea.  No belly pain.  On clears.  Hard for her to eat or drink due to marked tremor in UEs. Having regular BMs.    OBJECTIVE:         Vital signs in last 24 hours:    Temp:  [98.6 F (37 C)-98.9 F (37.2 C)] 98.9 F (37.2 C) (07/22 0919) Pulse Rate:  [97-120] 97  (07/22 0919) Resp:  [19-20] 20  (07/22 0919) BP: (136-157)/(93-101) 150/100 mmHg (07/22 0919) SpO2:  [96 %-100 %] 99 % (07/22 0919) Weight:  [109 lb 12.6 oz (49.8 kg)] 109 lb 12.6 oz (49.8 kg) (07/22 0500) Last BM Date: 03/03/12 General: looks chronically ill, cachectic, tremulous   Heart: RRR Chest: coarse BS, some dyspnea,  Abdomen: protuberant, soft, NT, active BS  Extremities: no edema.  Limbs with generalized muscle wasting Neuro/Psych:  Mumbles, appropriate, oriented to place, self, appropriate.  Marked UE tremor B.  Intake/Output from previous day: 07/21 0701 - 07/22 0700 In: 350 [P.O.:100; IV Piggyback:250] Out: 300 [Urine:300]  Intake/Output this shift: Total I/O In: 60 [P.O.:60] Out: -   Lab Results:  Basename 03/04/12 0555 03/03/12 1014 03/02/12 1810  WBC 19.3* 24.4* 22.9*  HGB 7.6* 8.4* 7.1*  HCT 22.2* 23.9* 19.6*  PLT 394 424* 325   BMET  Basename 03/04/12 0555 03/03/12 1013 03/02/12 0725  NA 138 136 137  K 3.1* 3.0* 2.8*  CL 105 102 102  CO2 15* 17* 19  GLUCOSE 74 80 75  BUN 13 17 28*  CREATININE 0.50 0.56 0.66  CALCIUM 6.4* 6.6* 6.5*   LFT  Basename 03/03/12 1013 03/02/12 0725  PROT 6.5 6.3  ALBUMIN 1.3* 1.3*  AST 55* 60*  ALT 17 14  ALKPHOS 101 84  BILITOT 1.4* 1.6*  BILIDIR -- --  IBILI -- --   Studies/Results: Dg Chest 2 View  03/03/2012  CHEST - 2 VIEW     IMPRESSION: 1.  Significantly worsening aeration throughout the left lung, compatible with progressive multilobar pneumonia. 2.  New small bilateral pleural effusions. 3.  Atherosclerosis.  Original Report Authenticated By: Florencia Reasons, M.D.    ASSESMENT: *  Acute pneumonia.  On Vanc and Zosyn. *  Anemia, normocytic.  No FOB status.  *  Chronic alcohol abuse and dependence.  Has signs of etoh withdrawal though sister says pt has chronic tremor.  *  Alcoholic hepatitis and fatty liver.  Trace ascites.  *  Gallstones.  ? Small distal CBD stone but normal diameter CBD.  *  Chronic calcific pancreatitis with calcifications at head of pancreas on CT scan.  *  Acute renal failure. Improving *  Hyperglycemia, no prior dx of DM   PLAN: *  Per Dr Arlyce Dice.  ?  MRCP vs EUS vs repeat MRCP when pt can cooperate for exam.     LOS: 4 days   [name]  03/04/2012, 11:21 AM Pager: [phone]

## 2012-03-04 NOTE — Progress Notes (Signed)
Physical Therapy Treatment Patient Details Name: [name] MRN: [id number] DOB: [birth year] Today's Date: 03/04/2012 Time: 1355-1416 PT Time Calculation (min): 21 min  PT Assessment / Plan / Recommendation Comments on Treatment Session  Increased time to encourage pt to participate in therapy.  Pt agreeable to OOB activity when she needed to use bathroom.  Refused increased ambulation distance, therefore performed sit<>stand transfers 5x's.  Pt fairly unsteady when up.        Follow Up Recommendations  Home health PT;Supervision/Assistance - 24 hour    Barriers to Discharge        Equipment Recommendations   (TBA)    Recommendations for Other Services    Frequency Min 3X/week   Plan Discharge plan remains appropriate    Precautions / Restrictions Precautions Precautions: Fall Restrictions Weight Bearing Restrictions: No       Mobility  Bed Mobility Bed Mobility: Supine to Sit;Sit to Supine;Sitting - Scoot to Edge of Bed Supine to Sit: 5: Supervision;HOB elevated;With rails Sitting - Scoot to Edge of Bed: 5: Supervision Sit to Supine: 5: Supervision;HOB flat Details for Bed Mobility Assistance: Increased time required & sequencing.  cont's to need cueing for getting hips closer to EOB before standing.   Transfers Transfers: Sit to Stand;Stand to [employer] Transfers Sit to Stand: 4: Min assist;4: Min guard;With upper extremity assist;From bed;From chair/3-in-1;With armrests Stand to Sit: 4: Min guard;With armrests;To bed;To chair/3-in-1 Stand Pivot Transfers: 4: Min assist Details for Transfer Assistance: Cues for hand placement, body positioning before sitting, & technique.  Assist for balance & safety.   Ambulation/Gait Ambulation/Gait Assistance: 4: Min assist Ambulation Distance (Feet): 10 Feet (5' forwards + 5' backwards) Assistive device: Other (Comment) (IV pole) Ambulation/Gait Assistance Details: Pt refusing to increase ambulation distance despite max  encouragement.  Assist for balance & safety.   Gait Pattern: Decreased stride length;Decreased step length - right;Decreased step length - left ("staggering") Stairs: No Wheelchair Mobility Wheelchair Mobility: No      PT Goals Acute Rehab PT Goals Time For Goal Achievement: 03/08/12 Potential to Achieve Goals: Good PT Goal: Sit to Stand - Progress: Not met PT Goal: Stand to Sit - Progress: Not met PT Goal: Ambulate - Progress: Not met  Visit Information  Last PT Received On: 03/04/12 Assistance Needed: +1    Subjective Data      Cognition  Overall Cognitive Status: Impaired Area of Impairment: Other (comment) (slow to process) Arousal/Alertness: Awake/alert Orientation Level: Appears intact for tasks assessed Behavior During Session: Flat affect Attention - Other Comments: slow to process cues/instructions    Balance     End of Session PT - End of Session Equipment Utilized During Treatment: Gait belt Activity Tolerance: Other (comment) (self-limiting.  ) Patient left: in bed;with call bell/phone within reach;with family/visitor present Nurse Communication: Mobility status    Verdell Face, [place] [phone] 03/04/2012

## 2012-03-04 NOTE — Progress Notes (Signed)
FMTS Attending Daily Note: Denny Levy MD [phone] pager office [phone] I have discussed this patient with the resident and reviewed the assessment and plan as documented above. I agree wit the resident's findings and plan. Additionally, I am concerned for her multilobar appearance on CXR. We have the bases of the lungs imaged by incidental finding ---she seems to be slightly improving. If not continued significant improvement tomorrow I think we will do dedicated lung CT. Several possibilities including post obstructive pneumonia in patient with hx breast cancer as well as atypical infection. She has odd iron panel with low TIBC  And very elevated ferritin. Ferritin may be related to acute phase reactant from infection or from increased iron absorption long term with alcohol intake.  Her HIV is negative  But I think keeping in mind atypical infections is important. PPD is pending.

## 2012-03-04 NOTE — Progress Notes (Signed)
Family Medicine Teaching Service Daily Progress Note Intern Pager: [phone]  Patient name: [name] Medical record number: [id number] Date of birth: 1961/05/21 Age: 51 y.o. Gender: female  Primary Care Provider: MATTHEWS,CODY, DO  Subjective: Pt seen at bedside. SOB and "all over pain" remains. Pt still speaks very low/fast, mumbling but oriented to time/place/self/circumstance. Coughing increased this morning.  Objective: Temp:  [98.6 F (37 C)-98.9 F (37.2 C)] 98.9 F (37.2 C) (07/22 0919) Pulse Rate:  [97-120] 97  (07/22 0919) Resp:  [19-20] 20  (07/22 0919) BP: (136-157)/(93-101) 150/100 mmHg (07/22 0919) SpO2:  [96 %-100 %] 99 % (07/22 0919) Weight:  [109 lb 12.6 oz (49.8 kg)] 109 lb 12.6 oz (49.8 kg) (07/22 0500) Exam: General: lying in bed, appears uncomfortable, mild tremor in arms, on 2L; oriented x4 Cardiovascular: increased rate, no murmur appreciated Respiratory: increased work of breathing with diffusely reduced breath sounds but less coarse than previous days, no retractions Abdomen: soft, diffusely tender but continues to improve; still more  tender in LUQ Extremities: moves all extremities equally, good distal pulses  Laboratory:  Lab 03/04/12 0555 03/03/12 1014 03/02/12 1810  WBC 19.3* 24.4* 22.9*  HGB 7.6* 8.4* 7.1*  HCT 22.2* 23.9* 19.6*  PLT 394 424* 325    Lab 03/04/12 0555 03/03/12 1013 03/02/12 0725 03/01/12 0630  NA 138 136 137 --  K 3.1* 3.0* 2.8* --  CL 105 102 102 --  CO2 15* 17* 19 --  BUN 13 17 28* --  CREATININE 0.50 0.56 0.66 --  CALCIUM 6.4* 6.6* 6.5* --  PROT -- 6.5 6.3 6.2  BILITOT -- 1.4* 1.6* 1.6*  ALKPHOS -- 101 84 66  ALT -- 17 14 10   AST -- 55* 60* 53*  GLUCOSE 74 80 75 --   HIV: nonreactive, 7/18  Imaging/Diagnostic Tests: CXR, 7/18 @1424  IMPRESSION:  Dense pneumonia in the left lung, probably the left upper lobe.  CT, 7/18 @2028  IMPRESSION:  Dense left upper lobe and superior segment left lower lobe    consolidation, most compatible with pneumonia. Collapse with  proximal endobronchial lesion is felt less likely.  Gallstones with possible choledocholithiasis or volume averaging  from pancreatic calcification. Consider MRCP for further  evaluation.  Hepatic steatosis.  Trace upper abdominal ascites.  MRCP, 7/19 @1243  IMPRESSION:  1. Moderate to severely degraded exam, secondary to motion and  inability to hold breath.  2. No biliary ductal dilatation. Distal common duct findings,  which when combined with CT findings, are most consistent with  small distal common duct stones. Concurrent cholelithiasis, as on CT.  3. Findings of chronic pancreatitis, without definite acute  superimposed process.  4. Marked hepatic steatosis.  5. Left base air space disease and adjacent fluid, as before.  6. Renal and perirenal edema with suggestion of decreased contrast  excretion on delayed images. Question acute or subacute renal  Insufficiency.  CXR, 7/21 IMPRESSION:  1. Significantly worsening aeration throughout the left lung,  compatible with progressive multilobar pneumonia.  2. New small bilateral pleural effusions.  3. Atherosclerosis.   Assessment/Plan: Lucienne Sawyers is a 51yo female with PMH significant for HTN, EtOH abuse/alcoholic hepatitis, and breast CA presenting with inability to tolerate PO with shortness of breath. Admitted from clinic on 7/18.   1. Abdominal pain/anorexia: Patient with history of EtOH abuse and EtOH induced hepatitis, now with abdominal pain, icterus and inability to tolerate PO. Broad differential including pancreatitis, hepatitis, malignancy, gallstones, gastritis. Admitted to telemetry and for hydration. CT shows LUL  PNA, gallstones with possible choledocolithiasis and hepatic steatosis with trace upper abd ascites. MRCP on 7/19 is a generally poor study, shows current cholelithiasis and chronic pancreatitis and nonspecific distal common duct findings.  Required replenishment of K and Mg, 7/18-19. K still low, 7/19. Pt with generally improving abdominal pain. PLAN - Protonix daily. Cont. Zofran PRN for nausea, morphine PRN for pain. Will replenish electrolytes as appropriate. GI assistance appreciated; will follow up their recommendations.  2. Shortness of breath/Cough: Given history of EtOH abuse and CXR, CT findings, most likely aspiration PNA of LUL. DDx also includes PE, but less likely. Subjective fevers at home, none here so far this admission. TB considered in DDx, PPD placed 7/20. Vancomycin added 7/21 as pt was not improving on Zosyn + azithromycin. Pulmonary exam slightly improved on 7/22 with WBC beginning to trend down. PLAN - Continue Zosyn per pharmacy given likelihood of aspiration PNA; cont Vancomycin, likewise. Supplemental O2 as needed. PPD placed 7/20, to be read 7/22. Azithromycin started on 7/19 for 3 days to cover atypicals, completed.  3. Anemia - Hb 11.0 in April 2013, 8.5 on admission this time. Trended down to 6.9 on 7/19. Now 7.6 on 7/20. Most likely mixed vitamin/iron deficiencies secondary to EtOH abuse and general poor PO intake; MCV near upper limit of normal with one measurement 101.6, and iron panel on 7/21 is consistent with iron deficiency. Stools are hemoccult negative, no bloody emesis. Possible iron deficiency as well given general poor nutrition. Pt typed and screened on 7/19, expires 7/22.  PLAN - Continue multivitamin, folate, thiamine. Oral iron started 7/21. Will also consider transfusion if pt becomes more symptomatic or if Hb drops below 6.  4. EtOH abuse: Last drink >7 days ago. Unlikely to withdraw at this point if she has truly not had anything to drink more recently. Pt with some elevated BP, temp, tachycardia, sporadic increasedO2 requirement throughout admission, remains today. Pt has required Ativan x2. PLAN - Continue CIWA protocol, renewed 7/21. Multivitamin, folic acid, thiamine.  5. Tachycardia:  Initially believed likely 2/2 to dehydration, although as above PE is possible but less likely. Also possible EtOH withdrawal. Continue IVF, monitor.  6. Depression: Has expressed passive SI in the past as well as in clinic on 7/18. No plan vocalized. Will continue home SSRI and monitor closely.   7. HTN: Slightly low BP on admission, anti-hypertensives were held. Will restart at or before discharge pending resolution of other problems. BP remains elevated, though improved from 7/21. PLAN - Home HCTZ restarted on 7/21, may add lisinopril back pending more clinical improvement.  FEN/GI: clear liquid diet, NS @ 100 mL/hr. MVI with thiamine and folic acid  PPX: Lovenox, Protonix.  Dispo: Pending further work up  Bobbye Morton, MD PGY-1, [facility] Family Medicine FPTS Intern pager: [phone]

## 2012-03-04 NOTE — Progress Notes (Signed)
Patient's TB test read. Placed 03/02/2012 at 1635. Zero induration. Verified with Clarene Reamer Magbitang RN.  Jennifer Chatmon infection prevention notified. Airborne isolation discontinued.

## 2012-03-04 NOTE — Progress Notes (Signed)
Paucity of LFT abnormalities and absence of abdominal pain renders CBD less likely.  Would hold further GI w/u for now.  I have personally taken an interval history, reviewed the chart, and examined the patient.  I agree with the extender's note, impression and recommendations.

## 2012-03-05 ENCOUNTER — Inpatient Hospital Stay (HOSPITAL_COMMUNITY): Payer: Self-pay

## 2012-03-05 LAB — COMPREHENSIVE METABOLIC PANEL
AST: 31 U/L (ref 0–37)
Albumin: 1.2 g/dL — ABNORMAL LOW (ref 3.5–5.2)
Alkaline Phosphatase: 59 U/L (ref 39–117)
BUN: 9 mg/dL (ref 6–23)
Chloride: 109 mEq/L (ref 96–112)
Potassium: 3.4 mEq/L — ABNORMAL LOW (ref 3.5–5.1)
Total Bilirubin: 1 mg/dL (ref 0.3–1.2)
Total Protein: 5.8 g/dL — ABNORMAL LOW (ref 6.0–8.3)

## 2012-03-05 LAB — CBC
HCT: 19.2 % — ABNORMAL LOW (ref 36.0–46.0)
MCH: 34.5 pg — ABNORMAL HIGH (ref 26.0–34.0)
MCHC: 33.1 g/dL (ref 30.0–36.0)
MCV: 99 fL (ref 78.0–100.0)
RBC: 1.94 MIL/uL — ABNORMAL LOW (ref 3.87–5.11)
RDW: 15.4 % (ref 11.5–15.5)
RDW: 15.6 % — ABNORMAL HIGH (ref 11.5–15.5)
WBC: 14.3 10*3/uL — ABNORMAL HIGH (ref 4.0–10.5)

## 2012-03-05 LAB — PREPARE RBC (CROSSMATCH)

## 2012-03-05 LAB — GLUCOSE, CAPILLARY: Glucose-Capillary: 82 mg/dL (ref 70–99)

## 2012-03-05 LAB — MAGNESIUM: Magnesium: 0.6 mg/dL — CL (ref 1.5–2.5)

## 2012-03-05 LAB — OCCULT BLOOD X 1 CARD TO LAB, STOOL: Fecal Occult Bld: POSITIVE

## 2012-03-05 MED ORDER — FUROSEMIDE 10 MG/ML IJ SOLN
20.0000 mg | Freq: Once | INTRAMUSCULAR | Status: AC
  Administered 2012-03-06: 20 mg via INTRAVENOUS
  Filled 2012-03-05: qty 2

## 2012-03-05 MED ORDER — MAGNESIUM SULFATE 40 MG/ML IJ SOLN
4.0000 g | Freq: Every day | INTRAMUSCULAR | Status: AC
  Administered 2012-03-06 – 2012-03-07 (×2): 4 g via INTRAVENOUS
  Filled 2012-03-05 (×3): qty 100

## 2012-03-05 MED ORDER — DIPHENHYDRAMINE HCL 50 MG/ML IJ SOLN: 12.5000 mg | Freq: Four times a day (QID) | INTRAMUSCULAR | Status: DC | PRN

## 2012-03-05 MED ORDER — MAGNESIUM SULFATE 40 MG/ML IJ SOLN
4.0000 g | Freq: Once | INTRAMUSCULAR | Status: AC
  Administered 2012-03-05: 4 g via INTRAVENOUS
  Filled 2012-03-05: qty 100

## 2012-03-05 MED ORDER — ENSURE COMPLETE PO LIQD
237.0000 mL | Freq: Two times a day (BID) | ORAL | Status: DC
  Administered 2012-03-08 – 2012-03-12 (×8): 237 mL via ORAL

## 2012-03-05 MED ORDER — IOHEXOL 300 MG/ML  SOLN
80.0000 mL | Freq: Once | INTRAMUSCULAR | Status: AC | PRN
  Administered 2012-03-05: 55 mL via INTRAVENOUS

## 2012-03-05 MED ORDER — DIPHENHYDRAMINE HCL 50 MG/ML IJ SOLN: 12.5000 mg | INTRAMUSCULAR | Status: DC | PRN

## 2012-03-05 NOTE — Progress Notes (Signed)
I cosign for [name]'s assessment, med administration, notes, I/O, and care plan education. 

## 2012-03-05 NOTE — Progress Notes (Signed)
Paged MD to make aware of HGB of 5.6

## 2012-03-05 NOTE — Progress Notes (Signed)
PT Cancellation Note  Treatment cancelled today due to medical issues with patient which prohibited therapy.  Hgb 6.7.    [name], [place] [phone] 03/05/2012

## 2012-03-05 NOTE — Progress Notes (Signed)
No complaints of pain. I favor EUS at a later time to determine if she has a small CBD stone.

## 2012-03-05 NOTE — Progress Notes (Signed)
Pt. With critical Hemoglobin lab value of 6.7 this am. MD notified. Will continue to monitor patient. Idalie Canto, Cheryll Dessert

## 2012-03-05 NOTE — Progress Notes (Signed)
Hold EGD unless pt develops overt signs of GI bleeding or is hemeoccult positive.

## 2012-03-05 NOTE — Progress Notes (Signed)
Pt. With a critical Mg value of 0.6 this am. Also critical calcium of 5.9. MD paged. Will repeat call and report to oncoming RN. Will continue to monitor pt. [name], Cheryll Dessert

## 2012-03-05 NOTE — Progress Notes (Signed)
Family Medicine Teaching Service Daily Progress Note Intern Pager: [phone]  Patient name: [name] Medical record number: [id number] Date of birth: 1960/12/03 Age: 51 y.o. Gender: female  Primary Care Provider: MATTHEWS,CODY, DO  Subjective: Pt seen at bedside. Pt states she feels "okay" this morning but still unwell. Pt remains difficult to understand at times secondary to rapid speaking/mumbling but appears oriented and answers questions appropriately and thanks examiner for answering her questions. Some "all over" body pain remains, especially on left side.  Objective: Temp:  [97.5 F (36.4 C)-98 F (36.7 C)] 97.7 F (36.5 C) (07/23 0509) Pulse Rate:  [105-121] 114  (07/23 0900) Resp:  [22-24] 22  (07/23 0509) BP: (146-157)/(92-101) 154/101 mmHg (07/23 0900) SpO2:  [93 %-100 %] 93 % (07/23 0509) Weight:  [115 lb 8.3 oz (52.4 kg)] 115 lb 8.3 oz (52.4 kg) (07/23 0509) Exam: General: lying in bed, appears uncomfortable, coarse tremor in arms more marked today, on 2L [place] Cardiovascular: increased rate, no murmur appreciated Respiratory: increased work of breathing remains, breath sounds still diminished and coarse diffusely, mildly improved from previous days in left upper lung field; mild supraclavicular retractions Abdomen: soft, diffusely tender, mostly in LUQ, which remains from previous days Musculokeletal: tenderness along and under ribs on left side, as well as parasternal tenderness Extremities: moves all extremities equally,  distal pulses  Laboratory:  Lab 03/05/12 0550 03/04/12 0555 03/03/12 1014  WBC 14.3* 19.3* 24.4*  HGB 6.7* 7.6* 8.4*  HCT 19.2* 22.2* 23.9*  PLT 398 394 424*    Lab 03/05/12 0550 03/04/12 0555 03/03/12 1013 03/02/12 0725  NA 138 138 136 --  K 3.4* 3.1* 3.0* --  CL 109 105 102 --  CO2 18* 15* 17* --  BUN 9 13 17  --  CREATININE 0.47* 0.50 0.56 --  CALCIUM 5.9* 6.4* 6.6* --  PROT 5.8* -- 6.5 6.3  BILITOT 1.0 -- 1.4* 1.6*  ALKPHOS 59 --  101 84  ALT 14 -- 17 14  AST 31 -- 55* 60*  GLUCOSE 108* 74 80 --   HIV: nonreactive, 7/18 Fecal occult blood:  Negative on 7/20  POSITIVE on 7/23  Imaging/Diagnostic Tests: CXR, 7/18 @1424  IMPRESSION:  Dense pneumonia in the left lung, probably the left upper lobe.  CT, 7/18 @2028  IMPRESSION:  Dense left upper lobe and superior segment left lower lobe  consolidation, most compatible with pneumonia. Collapse with  proximal endobronchial lesion is felt less likely.  Gallstones with possible choledocholithiasis or volume averaging  from pancreatic calcification. Consider MRCP for further  evaluation.  Hepatic steatosis.  Trace upper abdominal ascites.  MRCP, 7/19 @1243  IMPRESSION:  1. Moderate to severely degraded exam, secondary to motion and  inability to hold breath.  2. No biliary ductal dilatation. Distal common duct findings,  which when combined with CT findings, are most consistent with  small distal common duct stones. Concurrent cholelithiasis, as on CT.  3. Findings of chronic pancreatitis, without definite acute  superimposed process.  4. Marked hepatic steatosis.  5. Left base air space disease and adjacent fluid, as before.  6. Renal and perirenal edema with suggestion of decreased contrast  excretion on delayed images. Question acute or subacute renal  Insufficiency.  CXR, 7/21 IMPRESSION:  1. Significantly worsening aeration throughout the left lung,  compatible with progressive multilobar pneumonia.  2. New small bilateral pleural effusions.  3. Atherosclerosis.   Assessment/Plan: [name] is a 51yo female with PMH significant for HTN, EtOH abuse/alcoholic hepatitis, and breast CA (  2001, s/p lumpectomy, chemo, and radiation therapy) presenting with inability to tolerate PO with shortness of breath. Admitted from clinic on 7/18.   1. Abdominal pain/anorexia: Patient with history of EtOH abuse and EtOH induced hepatitis, now with abdominal pain,  icterus and inability to tolerate PO. Broad differential including pancreatitis, hepatitis, malignancy, gallstones, gastritis. Admitted to telemetry and for hydration. CT shows LUL PNA, gallstones with possible choledocolithiasis and hepatic steatosis with trace upper abd ascites. MRCP on 7/19 is a generally poor study, shows current cholelithiasis and chronic pancreatitis and nonspecific distal common duct findings. Requiring replenishment of K and Mg during hospital stay. Pt with generally improving abdominal pain, but fecal occult blood now positive on 7/23. PLAN - Protonix daily. Cont. Zofran PRN for nausea, morphine PRN for pain. Per GI, pt not stable for EGD today; will continue and to follow their recommendations, which are appreciated. Will continue to replenish electrolytes as appropriate.  2. Shortness of breath/Cough: Given history of EtOH abuse and CXR, CT findings, most likely aspiration PNA of LUL. DDx also includes PE, but less likely. Subjective fevers at home, none here so far this admission. TB considered in DDx, PPD placed 7/20, negative on 7/22. Vancomycin was added as pt was not improving on Zosyn + azithromycin. Pulmonary exam slightly improved on 7/22-23 with WBC continuing to trending down. PLAN - CT of chest with contrast, given pt has a persistent pna with only slight improvement, with risk factors for PE, obstructive pna with hx of breast CA, etc. Continue Zosyn (started 7/18) and Vancomycin per pharmacy (started 7/21), likewise. Supplemental O2 as needed. Azithromycin started on 7/19 for 3 days to cover atypicals, completed.  3. Anemia - Hb 11.0 in April 2013, 8.5 on admission this time. Trended down to 6.9 on 7/19. Now 7.6 on 7/20. Most likely mixed vitamin/iron deficiencies secondary to EtOH abuse and general poor PO intake; MCV near upper limit of normal with one measurement 101.6, and iron panel on 7/21 shows low iron and TIBC but elevation of ferritin.Oral iron started 7/21,  but discontinued 7/23; pt likely with iron metabolism derangement secondary to alcoholism and we do not want to iron overload her.  Stool initially were hemoccult negative, no bloody emesis. Stools now hemoccult positive on 7/23. PLAN - Continue multivitamin, folate, thiamine.  Repeat CBC 7/23 at 1800, may transfuse.  4. EtOH abuse: Last drink >7 days ago. Unlikely to withdraw at this point if she has truly not had anything to drink more recently. Pt with some elevated BP, temp, tachycardia, sporadic increasedO2 requirement throughout admission, remains today. Pt has required Ativan x2 per CIWA protocol, prior to starting scheduled Ativan on 7/22. PLAN - Continue scheduled Ativan. Continue CIWA protocol, renewed 7/21. Multivitamin, folic acid, thiamine.  5. Tachycardia: Initially believed likely 2/2 to dehydration, although as above PE is possible but less likely. Also possible EtOH withdrawal and/or component of anemia, as above. Continue IVF, monitor.  6. Depression: Has expressed passive SI in the past as well as in clinic on 7/18. No plan vocalized. Will continue home SSRI and monitor closely.   7. HTN: Slightly low BP on admission, anti-hypertensives were held. Will restart at or before discharge pending resolution of other problems. BP remains elevated, though improved from 7/21. PLAN - Home HCTZ restarted on 7/21, may add lisinopril back pending more clinical improvement.  FEN/GI: clear liquid diet, NS @ 75 mL/hr. MVI with thiamine and folic acid  PPX: Lovenox d/ced 7/23 as FOB is positive, SCD's only  for now; Protonix.  Dispo: Pending further work up  Bobbye Morton, MD PGY-1, [facility] Family Medicine FPTS Intern pager: [phone]

## 2012-03-05 NOTE — Progress Notes (Addendum)
     [facility] Daily Rounding Note 03/05/2012, 8:53 AM  SUBJECTIVE:       Not eating much.  Stool overnight not reported to be bloody or melenic.  Tremors and lethargy continue. Cough continues  OBJECTIVE:         Vital signs in last 24 hours:    Temp:  [97.5 F (36.4 C)-98.9 F (37.2 C)] 97.7 F (36.5 C) (07/23 0509) Pulse Rate:  [97-121] 105  (07/23 0509) Resp:  [19-24] 22  (07/23 0509) BP: (141-163)/(92-103) 146/92 mmHg (07/23 0509) SpO2:  [93 %-100 %] 93 % (07/23 0509) Weight:  [115 lb 8.3 oz (52.4 kg)] 115 lb 8.3 oz (52.4 kg) (07/23 0509) Last BM Date: 03/05/12 General: looks acutely ill, cachectic, not well.  Lethargic but arouses   Heart: slightly tachy, regular Chest: BS on left are absent or diminished at best.  Cough is loose, congested Abdomen: soft, NT, ND,  BS present  Extremities: no pedal edema Neuro/Psych:  mildy obtunded.  Speaks very little, follows commands.  Knows who she is , where she is.    Intake/Output from previous day: 07/22 0701 - 07/23 0700 In: 5133.8 [P.O.:375; I.V.:4408.8; IV Piggyback:350] Out: 300 [Urine:300]  Intake/Output this shift: Total I/O In: 50 [IV Piggyback:50] Out: 75 [Urine:75]  Lab Results:  Basename 03/05/12 0550 03/04/12 0555 03/03/12 1014  WBC 14.3* 19.3* 24.4*  HGB 6.7* 7.6* 8.4*  HCT 19.2* 22.2* 23.9*  PLT 398 394 424*   BMET  Basename 03/05/12 0550 03/04/12 0555 03/03/12 1013  NA 138 138 136  K 3.4* 3.1* 3.0*  CL 109 105 102  CO2 18* 15* 17*  GLUCOSE 108* 74 80  BUN 9 13 17   CREATININE 0.47* 0.50 0.56  CALCIUM 5.9* 6.4* 6.6*   LFT  Basename 03/05/12 0550 03/03/12 1013  PROT 5.8* 6.5  ALBUMIN 1.2* 1.3*  AST 31 55*  ALT 14 17  ALKPHOS 59 101  BILITOT 1.0 1.4*  BILIDIR -- --  IBILI -- --    ASSESMENT: * Acute pneumonia. On Vanc and Zosyn.  * Anemia, normocytic. No FOB status. Hgb further declined. No acute bleeding but FOB + in lab this AM. On protonix.   * Chronic alcohol abuse and dependence. Has  signs of etoh withdrawal though sister says pt has chronic tremor.  Hepatitis A, B,C negative.  * Alcoholic hepatitis and fatty liver. Trace ascites. LFts have normalized. * Gallstones. ? Small distal CBD stone but normal diameter CBD. Suggestion is for EUS at later date.  * Chronic calcific pancreatitis with calcifications at head of pancreas on CT scan.  * Acute renal failure. Improving  * Hyperglycemia, no prior dx of DM *  Dementia?, CT from 11/2011 shows advanced-for age atrophy and vascular disease  *  Hypokalemia *  Protein malnutrition.     PLAN: *  I made her NPO in case MD wants to pursue EGD, but her lungs are dicey and not sure we should risk sedation today.  *  Defer transfusion to primary team.  *   LOS: 5 days   [name]  03/05/2012, 8:53 AM Pager: [phone]   Addendum:  Will restart po's as she is too risky for EGD at present.

## 2012-03-05 NOTE — Progress Notes (Signed)
Nutrition Follow-up/consult   Intervention:   1. Change Resource Breeze to Ensure Complete po BID, each supplement provides 350 kcal and 13 grams of protein. 2. Continue vitamin supplements  3. RD will continue to follow    Assessment:   RD consulted for poor po intake and appears severely malnourished. Pt is currently being followed by RD, see initial note completed on 7/19. Pt with severe malnutrition in the context of chronic illness 2/2 muscle mass loss and </=75% intake > 1 month.  Pt with difficulty eat r/t tremors per MD notes. Continues on CIWA protocol.   RD spoke with pt's sister in room, does not know how the pt has been eating. RN unsure if pt has been drinking the [employer].   Diet Order:  NPO-for possible EGD, now planned to wait on EGD as pt is not stable enough. Previous diet was dysphagia 3 with thin liquids.  PO intake:0-20% most meals  Supplements: Resource Breeze TID  Meds: Scheduled Meds:   . enoxaparin (LOVENOX) injection  30 mg Subcutaneous Q24H  . feeding supplement  1 Container Oral TID BM  . ferrous sulfate  325 mg Oral QODAY  . folic acid  1 mg Oral Daily  . hydrochlorothiazide  12.5 mg Oral Daily  . LORazepam  1 mg Oral TID  . magnesium oxide  400 mg Oral BID  . magnesium sulfate 1 - 4 g bolus IVPB  4 g Intravenous Once  . magnesium sulfate 1 - 4 g bolus IVPB  4 g Intravenous Daily  . multivitamin with minerals  1 tablet Oral Daily  . pantoprazole  40 mg Oral Q1200  . piperacillin-tazobactam (ZOSYN)  IV  3.375 g Intravenous Q8H  . potassium chloride  40 mEq Oral BID  . sodium chloride  3 mL Intravenous Q12H  . thiamine  100 mg Oral Daily   Or  . thiamine  100 mg Intravenous Daily  . tuberculin  5 Units Intradermal Once  . vancomycin  500 mg Intravenous Q12H  . venlafaxine XR  150 mg Oral Daily   Continuous Infusions:   . sodium chloride 75 mL/hr (03/05/12 1009)   PRN Meds:.LORazepam, LORazepam, morphine injection, ondansetron (ZOFRAN) IV,  ondansetron  Labs:  CMP     Component Value Date/Time   NA 138 03/05/2012 0550   K 3.4* 03/05/2012 0550   CL 109 03/05/2012 0550   CO2 18* 03/05/2012 0550   GLUCOSE 108* 03/05/2012 0550   BUN 9 03/05/2012 0550   CREATININE 0.47* 03/05/2012 0550   CREATININE 0.89 12/15/2011 1210   CALCIUM 5.9* 03/05/2012 0550   PROT 5.8* 03/05/2012 0550   ALBUMIN 1.2* 03/05/2012 0550   AST 31 03/05/2012 0550   ALT 14 03/05/2012 0550   ALKPHOS 59 03/05/2012 0550   BILITOT 1.0 03/05/2012 0550   GFRNONAA >90 03/05/2012 0550   GFRAA >90 03/05/2012 0550     Intake/Output Summary (Last 24 hours) at 03/05/12 1231 Last data filed at 03/05/12 1005  Gross per 24 hour  Intake 5123.75 ml  Output    375 ml  Net 4748.75 ml    Weight Status:  115 lbs, trending up from admission weight of 108 lbs   Re-estimated needs:  1600-1800 kcal, 70-80 gm protein   Nutrition Dx:  Inadequate protein energy intake now R/T poor appetite AEB 0-20% intake of meals   Goal:  Oral intake and supplements to meet >/=90% of estimated nutrition needs, unmet  Monitor:  PO intake, weight, labs  Orson Slick RD, LDN Pager [phone] After Hours pager [phone]

## 2012-03-05 NOTE — Progress Notes (Signed)
Pt. With 6 beats of Vtach. Pt. Asymptomatic. VS stable/at baseline and documented. MD notified. No new orders. Will continue to monitor. [name], Cheryll Dessert

## 2012-03-06 DIAGNOSIS — R0602 Shortness of breath: Secondary | ICD-10-CM

## 2012-03-06 DIAGNOSIS — J852 Abscess of lung without pneumonia: Secondary | ICD-10-CM

## 2012-03-06 DIAGNOSIS — J189 Pneumonia, unspecified organism: Secondary | ICD-10-CM

## 2012-03-06 DIAGNOSIS — J850 Gangrene and necrosis of lung: Secondary | ICD-10-CM

## 2012-03-06 DIAGNOSIS — F10239 Alcohol dependence with withdrawal, unspecified: Secondary | ICD-10-CM

## 2012-03-06 DIAGNOSIS — R4182 Altered mental status, unspecified: Secondary | ICD-10-CM

## 2012-03-06 DIAGNOSIS — R0902 Hypoxemia: Secondary | ICD-10-CM

## 2012-03-06 LAB — COMPREHENSIVE METABOLIC PANEL
Alkaline Phosphatase: 64 U/L (ref 39–117)
BUN: 5 mg/dL — ABNORMAL LOW (ref 6–23)
Chloride: 107 mEq/L (ref 96–112)
Creatinine, Ser: 0.51 mg/dL (ref 0.50–1.10)
GFR calc Af Amer: >90 mL/min (ref >90)
Glucose, Bld: 75 mg/dL (ref 70–99)
Sodium: 140 mEq/L (ref 135–145)
Total Protein: 6.2 g/dL (ref 6.0–8.3)

## 2012-03-06 LAB — HEPATITIS PANEL, ACUTE: Hep B C IgM: NEGATIVE

## 2012-03-06 LAB — CBC
Platelets: 359 10*3/uL (ref 150–400)
RBC: 2.9 MIL/uL — ABNORMAL LOW (ref 3.87–5.11)
WBC: 12.1 10*3/uL — ABNORMAL HIGH (ref 4.0–10.5)

## 2012-03-06 LAB — TSH: TSH: 4.615 u[IU]/mL — ABNORMAL HIGH (ref 0.350–4.500)

## 2012-03-06 MED ORDER — LORAZEPAM 0.5 MG PO TABS
1.0000 mg | ORAL_TABLET | Freq: Four times a day (QID) | ORAL | Status: DC | PRN
  Administered 2012-03-08 – 2012-03-09 (×2): 1 mg via ORAL
  Filled 2012-03-06: qty 1
  Filled 2012-03-06: qty 2
  Filled 2012-03-06: qty 1

## 2012-03-06 MED ORDER — LORAZEPAM 0.5 MG PO TABS
0.5000 mg | ORAL_TABLET | Freq: Three times a day (TID) | ORAL | Status: DC
  Administered 2012-03-06 – 2012-03-10 (×10): 0.5 mg via ORAL
  Filled 2012-03-06 (×10): qty 1

## 2012-03-06 MED ORDER — LORAZEPAM 2 MG/ML IJ SOLN
1.0000 mg | Freq: Four times a day (QID) | INTRAMUSCULAR | Status: DC | PRN
  Administered 2012-03-08 – 2012-03-09 (×2): 1 mg via INTRAVENOUS
  Filled 2012-03-06 (×2): qty 1

## 2012-03-06 MED ORDER — VANCOMYCIN HCL IN DEXTROSE 1-5 GM/200ML-% IV SOLN
1000.0000 mg | Freq: Two times a day (BID) | INTRAVENOUS | Status: DC
  Administered 2012-03-06 – 2012-03-09 (×6): 1000 mg via INTRAVENOUS
  Filled 2012-03-06 (×6): qty 200

## 2012-03-06 MED FILL — Pantoprazole Sodium For IV Soln 40 MG (Base Equiv): INTRAVENOUS | Qty: 40 | Status: AC

## 2012-03-06 MED FILL — Potassium Chloride Inj 10 mEq/50ML: INTRAVENOUS | Qty: 50 | Status: AC

## 2012-03-06 MED FILL — Sodium Chloride Inj 0.9%: INTRAMUSCULAR | Qty: 10 | Status: AC

## 2012-03-06 MED FILL — Sodium Chloride Flush IV Soln 0.9%: INTRAVENOUS | Qty: 3 | Status: AC

## 2012-03-06 MED FILL — Lorazepam Inj 2 MG/ML: INTRAMUSCULAR | Qty: 1 | Status: AC

## 2012-03-06 MED FILL — Vancomycin HCl For IV Soln 500 MG (Base Equivalent): INTRAVENOUS | Qty: 500 | Status: AC

## 2012-03-06 NOTE — Progress Notes (Signed)
Pt NPO and pt takes meds with applesauce.  MD notified and said to give morning meds in applesauce.

## 2012-03-06 NOTE — Progress Notes (Addendum)
ANTIBIOTIC CONSULT NOTE - FOLLOW UP  Pharmacy Consult for Vancomycin/Zosyn Indication: pneumonia  No Known Allergies  Patient Measurements: Height: 5\' 7"  (170.2 cm) Weight: 115 lb 1.3 oz (52.2 kg) (scale C) IBW/kg (Calculated) : 61.6   Vital Signs: Temp: 98.5 F (36.9 C) (07/24 0530) Temp src: Oral (07/24 0530) BP: 125/88 mmHg (07/24 1252) Pulse Rate: 102  (07/24 1252) Intake/Output from previous day: 07/23 0701 - 07/24 0700 In: 1720 [P.O.:120; I.V.:250; Blood:1300; IV Piggyback:50] Out: 775 [Urine:775] Intake/Output from this shift: Total I/O In: 3 [I.V.:3] Out: 200 [Urine:200]  Labs:  [facility] 03/06/12 0640 03/05/12 1739 03/05/12 0550 03/04/12 0555  WBC 12.1* 11.9* 14.3* --  HGB 9.5* 5.6* 6.7* --  PLT 359 376 398 --  LABCREA -- -- -- --  CREATININE 0.51 -- 0.47* 0.50   Estimated Creatinine Clearance: 70.1 ml/min (by C-G formula based on Cr of 0.51). No results found for this basename: VANCOTROUGH:2,VANCOPEAK:2,VANCORANDOM:2,GENTTROUGH:2,GENTPEAK:2,GENTRANDOM:2,TOBRATROUGH:2,TOBRAPEAK:2,TOBRARND:2,AMIKACINPEAK:2,AMIKACINTROU:2,AMIKACIN:2, in the last 72 hours   Microbiology: No results found for this or any previous visit (from the past 720 hour(s)).  Anti-infectives     Start     Dose/Rate Route Frequency Ordered Stop   03/03/12 1430   vancomycin (VANCOCIN) 500 mg in sodium chloride 0.9 % 100 mL IVPB        500 mg 100 mL/hr over 60 Minutes Intravenous Every 12 hours 03/03/12 1420     03/01/12 1200   azithromycin (ZITHROMAX) tablet 500 mg        500 mg Oral Daily 03/01/12 1057 03/03/12 1136   02/29/12 1700   piperacillin-tazobactam (ZOSYN) IVPB 3.375 g        3.375 g 12.5 mL/hr over 240 Minutes Intravenous Every 8 hours 02/29/12 1637            Assessment: 51 yo female admitted with SOB and cough.  She is receiving Vancomycin and Zosyn for pneumonia. Today is Day 6 of Zosyn and Day 3 of Vancomycin.  Scr is 0.51 mg/dL and urine output is improving.  Patient's weight has increased from 49kg to 52.2 kg. Given this information, a vanc trough should be obtained before considering an increase in Vancomycin dose.  Goal of Therapy:  Vancomycin trough level 15-20 mcg/ml  Plan:  1.  Obtain a vancomycin trough at 1400 today. 2.  Continue Zosyn 3.375 g every 8 hours.  Lillia Pauls, PharmD Clinical Pharmacist Pager: [phone] Phone: [phone] 03/06/2012 1:26 PM     ______________________________________________________ Addendum: Vancomycin trough was 10.3 today at steady state.  Due to improving renal function and low trough, will increase dose to 1000 mg IV every 12 hours (with expected trough to be ~18 at new steady state). Will follow up pulmonology recommendations, renal function, and decision to continue therapy.  Lillia Pauls, PharmD Clinical Pharmacist Pager: [phone] Phone: [phone] 03/06/2012 4:15 PM

## 2012-03-06 NOTE — Progress Notes (Signed)
Physical Therapy Treatment Patient Details Name: [name] MRN: [id number] DOB: Oct 31, 1960 Today's Date: 03/06/2012 Time: 4098-1191 PT Time Calculation (min): 24 min  PT Assessment / Plan / Recommendation Comments on Treatment Session  Balance not formally assessed but pt required consistent min assist for balance/stability with OOB activity.  Pt ambulated with unilateral UE support on IV pole but may benefit from bil UE support to provide increased stability & safety.  Compared to initial evaluation pt is requiring increased assistance for mobility at this date.      Follow Up Recommendations  Home health PT;Supervision/Assistance - 24 hour    Barriers to Discharge        Equipment Recommendations  Other (comment) (TBA pending progress)    Recommendations for Other Services    Frequency Min 3X/week   Plan      Precautions / Restrictions Precautions Precautions: Fall Restrictions Weight Bearing Restrictions: No       Mobility  Bed Mobility Bed Mobility: Supine to Sit;Sit to Supine Details for Bed Mobility Assistance: NT assisting pt to EOB.  Encouraged NT to allow pt to perform as independently as possible.   Transfers Transfers: Sit to Stand;Stand to Sit Sit to Stand: 4: Min guard;With upper extremity assist;From bed;From chair/3-in-1 Stand to Sit: 4: Min guard;With upper extremity assist;With armrests;To chair/3-in-1;To bed Details for Transfer Assistance: Verbal & tactile cues for hand placement & body positioning before sitting.   Ambulation/Gait Ambulation/Gait Assistance: 4: Min assist Ambulation Distance (Feet): 50 Feet (25' + 25' ) Assistive device:  (pushed IV pole) Ambulation/Gait Assistance Details: Pt ambulated using IV pole with RUE for balance/stability but still required min assist for balance- pt with posterior lean & shakey.  Pt with small shuffle steps.  Refused to ambulate outside of room.  At this point pt would benefit from an AD to provide  increased stability & safety.   Gait Pattern: Decreased hip/knee flexion - right;Decreased hip/knee flexion - left;Shuffle;Narrow base of support Stairs: No Wheelchair Mobility Wheelchair Mobility: No      PT Goals Acute Rehab PT Goals Time For Goal Achievement: 03/08/12 Potential to Achieve Goals: Good PT Goal: Sit to Stand - Progress: Not met PT Goal: Stand to Sit - Progress: Not met PT Goal: Ambulate - Progress: Not met  Visit Information  Last PT Received On: 03/06/12 Assistance Needed: +1    Subjective Data      Cognition  Overall Cognitive Status: Impaired Area of Impairment: Awareness of deficits Orientation Level: Appears intact for tasks assessed Behavior During Session: Flat affect Awareness of Deficits: Pt states she does not feel she has decreased balance- although not formally tested pt requires assist to maintain balance.      Balance  Balance Balance Assessed:  (not formally assessed)  End of Session PT - End of Session Equipment Utilized During Treatment: Gait belt Activity Tolerance: Patient tolerated treatment well;Patient limited by fatigue Patient left: in chair;with call bell/phone within reach Nurse Communication: Mobility status     Verdell Face, [place] [phone] 03/06/2012

## 2012-03-06 NOTE — Progress Notes (Signed)
Lab called critical value- Ca is 6.4 this AM.  MD notified. No new orders given.  Will continue to monitor.

## 2012-03-06 NOTE — Consult Note (Signed)
     [facility] Daily Rounding Note 03/06/2012, 8:27 AM  SUBJECTIVE:       Has pain all over.  Can not verbalize specifics.  No nausea  OBJECTIVE:         Vital signs in last 24 hours:    Temp:  [97.1 F (36.2 C)-99.2 F (37.3 C)] 98.5 F (36.9 C) (07/24 0530) Pulse Rate:  [100-115] 100  (07/24 0530) Resp:  [18-20] 20  (07/24 0530) BP: (134-154)/(78-104) 137/101 mmHg (07/24 0530) SpO2:  [96 %-100 %] 100 % (07/23 2100) Weight:  [115 lb 1.3 oz (52.2 kg)] 115 lb 1.3 oz (52.2 kg) (07/24 0530) Last BM Date: 03/06/12 General: looks bad and unwell   Heart: Reg, slightly rapic Chest: very diminished BS.  Not coughing as she was yesterday Abdomen: soft, NT, ND. BS present  Extremities: no pedal edema Neuro/Psych:  Does not look me in the eye.  Speech jarbled but appropriate and follows commands.   Intake/Output from previous day: 07/23 0701 - 07/24 0700 In: 1720 [P.O.:120; I.V.:250; Blood:1300; IV Piggyback:50] Out: 775 [Urine:775]  Intake/Output this shift: Total I/O In: -  Out: 200 [Urine:200]  Lab Results:  Basename 03/06/12 0640 03/05/12 1739 03/05/12 0550  WBC 12.1* 11.9* 14.3*  HGB 9.5* 5.6* 6.7*  HCT 26.8* 16.9* 19.2*  PLT 359 376 398   BMET  Basename 03/06/12 0640 03/05/12 0550 03/04/12 0555  NA 140 138 138  K 3.5 3.4* 3.1*  CL 107 109 105  CO2 18* 18* 15*  GLUCOSE 75 108* 74  BUN 5* 9 13  CREATININE 0.51 0.47* 0.50  CALCIUM 6.4* 5.9* 6.4*   LFT  Basename 03/06/12 0640 03/05/12 0550 03/03/12 1013  PROT 6.2 5.8* 6.5  ALBUMIN 1.3* 1.2* 1.3*  AST 27 31 55*  ALT 13 14 17   ALKPHOS 64 59 101  BILITOT 1.0 1.0 1.4*  BILIDIR -- -- --  IBILI -- -- --   Studies/Results: Ct Chest W Contrast  03/05/2012 y.  CT CHEST WITH CONTRAST  IMPRESSION:  1.  Progressive multifocal pneumonia on the left with consolidation, cavitation and lobar expansion.  These features are most suggestive of infection with a gram-negative organism (Klebsiella, E Coli or Pseudomonas). 2.   Associated small right greater than left pleural effusions and probable reactive mediastinal lymphadenopathy.  Original Report Authenticated By: Gerrianne Scale, M.D.    ASSESMENT: *  Pneumonia. On vanc and zosyn *  Anemia.  S/p transfusion 2 PRBC 7/23 with appropriate bump in [facility] *  Heme positive stool *  alcoholism  *  Malnutrition. *  Hypertension.  On HCTZ *  Essential tremors   PLAN: *  Per dr Arlyce Dice   LOS: 6 days   [name]  03/06/2012, 8:27 AM Pager: [phone]

## 2012-03-06 NOTE — Progress Notes (Signed)
FMTS Attending Daily Note: Denny Levy MD [phone] pager office [phone] I have discussed this patient with the resident and reviewed the assessment and plan as documented above. I agree wit the resident's findings and plan except I think her pneumonia is NOT aspiration in  Origin. Seems somewhat atypical. She has improved slightly. Will get further imaging.

## 2012-03-06 NOTE — Clinical Social Work Psychosocial (Addendum)
    Clinical Social Work Department BRIEF PSYCHOSOCIAL ASSESSMENT 03/06/2012  Patient:  [name], [name]     Account Number:  [id number]     Admit date:  02/29/2012  Clinical Social Worker:  Lourdes Sledge  Date/Time:  03/06/2012 11:51 AM  Referred by:  Physician  Date Referred:  03/06/2012 Referred for  Substance Abuse  Other - See comment   Other Referral:   Medicaid questions.   Interview type:  Patient Other interview type:    PSYCHOSOCIAL DATA Living Status:  FRIEND(S) Admitted from facility:   Level of care:   Primary support name:  [name]  [phone] Primary support relationship to patient:  SIBLING Degree of support available:   Pt stated she lives with her friend [name] however pt very difficult to understand and CSW unable to obtain clear information regarding her support system.    CURRENT CONCERNS Current Concerns  Substance Abuse   Other Concerns:    SOCIAL WORK ASSESSMENT / PLAN CSW received referral for substance abuse and questions pt had pertaining to Medicaid and current living situation.    CSW visited pt room and explored pt living situation. Pt was very difficult to understand as she had garbled speech and was dosing off. Pt stated she lives with her friend [name] and confirmed she is able to return to his home at discharge. CSW proceeded to explore pt history of substance abuse. Pt stated she does not believe she has any challenges with alcohol, she only drinks 5 beers, approximately 3x a week. CSW explored whether pt would still be interested in resources for substance abuse counseling or AA meetings in the area. Pt stated she is already familiar with where AA meetings are however would accept the list CSW had. Pt also stated that she applied for [facility] February 18, 2012 and is awaiting a response back.    CSW contacted the financial counselor to assist with finding out the status of pt Medicaid. Pt appreciative and had no additional  concerns. Pt will dc home with home health services. CSW signing off.   Assessment/plan status:  No Further Intervention Required Other assessment/ plan:   Information/referral to community resources:   CSW provided pt with resources for substance abuse which included a list of counseling agencies in the area. Pt declined a list of AA meetings in [place]. CSW also informed pt that CSW would contact the financial counselor to facilitate with obtaining additional information pertaining to pt Medicaid status. CSW completed the SBIRT which can be found in EPIC.    PATIENT'S/FAMILY'S RESPONSE TO PLAN OF CARE: Pt laying on the recliner. Pt appeared tired and drowsy. Pt is very difficult to understand as her speech is garbled and her tone is low. Pt was attempting to answer CSW questions the best she could however understood that CSW was having a difficult time understanding her. Pt had no additional concerns. Pt will discharge to her friend [name] home. CSW signing off.

## 2012-03-06 NOTE — Consult Note (Signed)
Name: [name] MRN: [id number] DOB: [birth year]    LOS: 6  Referring MD:  [facility] FPTS Reason for Consult: Multi-focal PNA    [place] [facility] Note   History of Present Illness:  51 y/o F with PMH of HTN, ETOH abuse, ETOH hepatitis, breast cancer s/p lumpectomy, chemo/XRT admitted on 7/18 with a one week hx of increasing SOB, productive cough and decreased PO intake with n/v.  Also notes diarrhea.  Work up demonstrated cxr with dense PNA in LUL. Further evaluated with CT chest on 7/23 with dense L PNA and cavitation.  7/24 PCCM consulted for multifocal PNA.   Cultures:   Antibiotics: 7/19 Azithro>>> 7/18 Zosyn>>> 7/21 Vacno>>>   Tests / Events: 7/23 CT Chest>>>Progressive multifocal pneumonia on the left with consolidation, cavitation and lobar expansion. These features are most suggestive of infection with a gram-negative organism (Klebsiella, E Coli or Pseudomonas).  Associated small right greater than left pleural effusions and probable reactive mediastinal lymphadenopathy.    Past Medical History  Diagnosis Date  . Hypertension   . Alcoholic hepatitis   . Alcoholism   . Breast cancer 2001    Rt side. Had lumpectomy, chemo and XRT by [facility]  . Shortness of breath     Past Surgical History  Procedure Date  . Rt lumpectomy 2001    Prior to Admission medications   Medication Sig Start Date End Date Taking? Authorizing Provider  lisinopril-hydrochlorothiazide (ZESTORETIC) 20-12.5 MG per tablet Take 1 tablet by mouth daily. 12/29/11 12/28/12 Yes Rodolph Bong, MD  magnesium 30 MG tablet Take 1 tablet (30 mg total) by mouth 2 (two) times daily. 12/18/11 12/17/12  Rodolph Bong, MD  potassium chloride SA (K-DUR,KLOR-CON) 20 MEQ tablet Take 1 tablet (20 mEq total) by mouth 2 (two) times daily. 11/25/11 11/24/12  Flint Melter, MD  venlafaxine XR (EFFEXOR-XR) 150 MG 24 hr capsule Take 1 capsule (150 mg total) by mouth daily. 02/02/12 02/01/13  Rodolph Bong, MD    Allergies No  Known Allergies  Family History History reviewed. No pertinent family history.  Social History  reports that she quit smoking about 11 years ago. Her smoking use included Cigarettes. She has never used smokeless tobacco. She reports that she drinks about 25.2 ounces of alcohol per week. She reports that she does not use illicit drugs.  Review Of Systems:  Pt not cooperative.  Will only answer one word questions.    Vital Signs: Temp:  [97.1 F (36.2 C)-99.2 F (37.3 C)] 98.5 F (36.9 C) (07/24 0530) Pulse Rate:  [100-115] 100  (07/24 0530) Resp:  [18-20] 20  (07/24 0530) BP: (134-149)/(78-104) 137/101 mmHg (07/24 0530) SpO2:  [96 %-100 %] 100 % (07/23 2100) Weight:  [115 lb 1.3 oz (52.2 kg)] 115 lb 1.3 oz (52.2 kg) (07/24 0530) I/O last 3 completed shifts: In: 3150 [P.O.:300; I.V.:1150; Blood:1300; IV Piggyback:400] Out: 975 [Urine:975]  Physical Examination: General: thin adult female in NAD, temporal wasting noted Neuro: awakens, short answers only, appears irritated CV: s1s2 rrr, no m/r/g PULM: resp's even/non-labored, left coarse rhonchi GI: flat, soft, bsx4 active Extremities: warm/dry, no edema  Labs    CBC  Lab 03/06/12 0640 03/05/12 1739 03/05/12 0550  HGB 9.5* 5.6* 6.7*  HCT 26.8* 16.9* 19.2*  WBC 12.1* 11.9* 14.3*  PLT 359 376 398   BMET  Lab 03/06/12 0640 03/05/12 0550 03/04/12 0555 03/03/12 1013 03/02/12 0725 03/02/12 0720 03/01/12 1616 02/29/12 2112 02/29/12 1353  NA 140 138 138 136  137 -- -- -- --  K 3.5 3.4* -- -- -- -- -- -- --  CL 107 109 105 102 102 -- -- -- --  CO2 18* 18* 15* 17* 19 -- -- -- --  GLUCOSE 75 108* 74 80 75 -- -- -- --  BUN 5* 9 13 17  28* -- -- -- --  CREATININE 0.51 0.47* 0.50 0.56 0.66 -- -- -- --  CALCIUM 6.4* 5.9* 6.4* 6.6* 6.5* -- -- -- --  MG -- 0.6* -- -- -- 1.7 3.4* -- 1.0*  PHOS -- -- -- -- -- -- -- 1.9* 2.4     Lab 02/29/12 1353  INR 1.27    No results found for this basename:  PHART:5,PCO2:5,PCO2ART:5,PO2:5,PO2ART:5,HCO3:5,TCO2:5,O2SAT:5 in the last 168 hours   Radiology: 7/21 CXR>>>Significantly worsening aeration throughout the left lung, compatible with progressive multilobar pneumonia. . New small bilateral pleural effusions.  Atherosclerosis  7/23 CT Chest>>>Progressive multifocal pneumonia on the left with consolidation, cavitation and lobar expansion. These features are most suggestive of infection with a gram-negative organism (Klebsiella, E Coli or Pseudomonas).  Associated small right greater than left pleural effusions and probable reactive mediastinal lymphadenopathy.  Assessment and Plan: Active Problems:  Cholelithiasis  Abnormal finding on GI tract imaging  Pneumonia  Left cavitary PNA  Assessment: Extensive L PNA with cavitations.  DDx includes hx of ETOH abuse with aspiration, CAP, less likely TB.  HIV negative 7/18.  Denies IV drug use. Plan: -continue current abx, likely will need PICC line for extended IV abx -PPD -serial cxr's -will need repeat CT in 3 months -resp culture -caution with ativan and sedating effects given current respiratory status.   Canary Brim, NP-C  Pulmonary & Critical Care Pgr: [phone]  03/06/2012, 12:52 PM   Agree with abx use, likely necrotizing PNA from etoh and ?vomitting.  No indication for bronchoscopy at this time, this is not a post obstructive kind of picture.  Will need close monitoring for etoh withdrawal and mental status.  Will f/u on culture.  Will follow with you.  Patient seen and examined, agree with above note.  I dictated the care and orders written for this patient under my direction.  Koren Bound, M.D. [phone]

## 2012-03-06 NOTE — Progress Notes (Deleted)
ANTIBIOTIC CONSULT NOTE - FOLLOW UP  Pharmacy Consult for Vancomycin/Zosyn Indication: pneumonia  No Known Allergies  Patient Measurements: Height: 5\' 7"  (170.2 cm) Weight: 115 lb 1.3 oz (52.2 kg) (scale C) IBW/kg (Calculated) : 61.6   Vital Signs: Temp: 98.5 F (36.9 C) (07/24 0530) Temp src: Oral (07/24 0530) BP: 137/101 mmHg (07/24 0530) Pulse Rate: 100  (07/24 0530) Intake/Output from previous day: 07/23 0701 - 07/24 0700 In: 1720 [P.O.:120; I.V.:250; Blood:1300; IV Piggyback:50] Out: 775 [Urine:775] Intake/Output from this shift: Total I/O In: -  Out: 200 [Urine:200]  Labs:  [facility] 03/06/12 0640 03/05/12 1739 03/05/12 0550 03/04/12 0555  WBC 12.1* 11.9* 14.3* --  HGB 9.5* 5.6* 6.7* --  PLT 359 376 398 --  LABCREA -- -- -- --  CREATININE 0.51 -- 0.47* 0.50   Estimated Creatinine Clearance: 70.1 ml/min (by C-G formula based on Cr of 0.51). No results found for this basename: VANCOTROUGH:2,VANCOPEAK:2,VANCORANDOM:2,GENTTROUGH:2,GENTPEAK:2,GENTRANDOM:2,TOBRATROUGH:2,TOBRAPEAK:2,TOBRARND:2,AMIKACINPEAK:2,AMIKACINTROU:2,AMIKACIN:2, in the last 72 hours   Microbiology: No results found for this or any previous visit (from the past 720 hour(s)).  Anti-infectives     Start     Dose/Rate Route Frequency Ordered Stop   03/03/12 1430   vancomycin (VANCOCIN) 500 mg in sodium chloride 0.9 % 100 mL IVPB        500 mg 100 mL/hr over 60 Minutes Intravenous Every 12 hours 03/03/12 1420     03/01/12 1200   azithromycin (ZITHROMAX) tablet 500 mg        500 mg Oral Daily 03/01/12 1057 03/03/12 1136   02/29/12 1700  piperacillin-tazobactam (ZOSYN) IVPB 3.375 g       3.375 g 12.5 mL/hr over 240 Minutes Intravenous Every 8 hours 02/29/12 1637            Assessment: 51 yo lady admitted with SOB and cough.  She is receiving Vancomycin and Zosyn for pneumonia (likely aspiration). Today is Day 6 of Zosyn and Day 3 of Vancomycin.  Scr is 0.51 mg/dL and urine output is  improving. Patient's weight has increased from 49kg to 52.2 kg. Given this information, a vanc trough should be obtained before considering an increase in Vancomycin dose.  Goal of Therapy:  Vancomycin trough level 15-20 mcg/ml  Plan:  1.  Obtain a vancomycin trough at 1400 today. 2.  Continue Zosyn 3.375 g every 8 hours.  Lillia Pauls, PharmD Clinical Pharmacist Pager: [phone] Phone: [phone] 03/06/2012 9:30 AM

## 2012-03-06 NOTE — Consult Note (Signed)
Recommend GI followup as an outpatient where she can be evaluated for possible EUS to rule out a distal bile duct stone.  Signing off

## 2012-03-06 NOTE — Progress Notes (Signed)
Family Medicine Teaching Service Daily Progress Note Intern Pager: [phone]  Patient name: [name] Medical record number: [id number] Date of birth: [birth year] Age: 52 y.o. Gender: female  Primary Care Provider: MATTHEWS,CODY, DO  Subjective: Pt seen at bedside. Pt continues to remain difficult to understand at times (rapid soft speaking/mumbling) but appears oriented and answers questions appropriately and thanks examiner for answering her questions. Some "all over" body pain remains, especially on left side. Otherwise some SOB remains, mostly unchanged from previous days. Pt continues to not have much appetite but is not nauseated and does not complain of any specific abdominal pain.  Objective: Temp:  [97.1 F (36.2 C)-99.2 F (37.3 C)] 98.5 F (36.9 C) (07/24 0530) Pulse Rate:  [100-115] 100  (07/24 0530) Resp:  [18-20] 20  (07/24 0530) BP: (134-149)/(78-104) 137/101 mmHg (07/24 0530) SpO2:  [96 %-100 %] 100 % (07/23 2100) Weight:  [115 lb 1.3 oz (52.2 kg)] 115 lb 1.3 oz (52.2 kg) (07/24 0530) Exam: General: lying in bed, appears uncomfortable, coarse tremor in arms more marked today, on 2L [place] Cardiovascular: increased rate, no murmur appreciated Respiratory: decreased but coarse breath sounds/rhonchi, left worse than right, increased work of breathing remains, slightly improved Abdomen: soft, diffusely tender, mostly in LUQ, which remains from previous days Musculokeletal: tenderness along and under ribs on left side, as well as parasternal tenderness Extremities: moves all extremities equally,  distal pulses  Laboratory:  Lab 03/06/12 0640 03/05/12 1739 03/05/12 0550  WBC 12.1* 11.9* 14.3*  HGB 9.5* 5.6* 6.7*  HCT 26.8* 16.9* 19.2*  PLT 359 376 398    Lab 03/06/12 0640 03/05/12 0550 03/04/12 0555 03/03/12 1013  NA 140 138 138 --  K 3.5 3.4* 3.1* --  CL 107 109 105 --  CO2 18* 18* 15* --  BUN 5* 9 13 --  CREATININE 0.51 0.47* 0.50 --  CALCIUM 6.4* 5.9* 6.4* --    PROT 6.2 5.8* -- 6.5  BILITOT 1.0 1.0 -- 1.4*  ALKPHOS 64 59 -- 101  ALT 13 14 -- 17  AST 27 31 -- 55*  GLUCOSE 75 108* 74 --   HIV: nonreactive, 7/18 Fecal occult blood:  Negative on 7/20  POSITIVE on 7/23  Imaging/Diagnostic Tests: CXR, 7/18 @1424  IMPRESSION:  Dense pneumonia in the left lung, probably the left upper lobe.  CT, 7/18 @2028  IMPRESSION:  Dense left upper lobe and superior segment left lower lobe  consolidation, most compatible with pneumonia. Collapse with  proximal endobronchial lesion is felt less likely.  Gallstones with possible choledocholithiasis or volume averaging  from pancreatic calcification. Consider MRCP for further  evaluation.  Hepatic steatosis.  Trace upper abdominal ascites.  MRCP, 7/19 @1243  IMPRESSION:  1. Moderate to severely degraded exam, secondary to motion and  inability to hold breath.  2. No biliary ductal dilatation. Distal common duct findings,  which when combined with CT findings, are most consistent with  small distal common duct stones. Concurrent cholelithiasis, as on CT.  3. Findings of chronic pancreatitis, without definite acute  superimposed process.  4. Marked hepatic steatosis.  5. Left base air space disease and adjacent fluid, as before.  6. Renal and perirenal edema with suggestion of decreased contrast  excretion on delayed images. Question acute or subacute renal  Insufficiency.  CXR, 7/21 IMPRESSION:  1. Significantly worsening aeration throughout the left lung,  compatible with progressive multilobar pneumonia.  2. New small bilateral pleural effusions.  3. Atherosclerosis.   Assessment/Plan: Cherene Dobbins is a [phone]  female with PMH significant for HTN, EtOH abuse/alcoholic hepatitis, and breast CA (2001, s/p lumpectomy, chemo, and radiation therapy) presenting with inability to tolerate PO with shortness of breath. Admitted from clinic on 7/18.   1. Abdominal pain/anorexia: Patient with history of  EtOH abuse and EtOH induced hepatitis, now with abdominal pain, icterus and inability to tolerate PO. Broad differential including pancreatitis, hepatitis, malignancy, gallstones, gastritis. Admitted to telemetry and for hydration. CT shows LUL PNA, gallstones with possible choledocolithiasis and hepatic steatosis with trace upper abd ascites. MRCP on 7/19 is a generally poor study, shows current cholelithiasis and chronic pancreatitis and nonspecific distal common duct findings. Requiring replenishment of K and Mg during hospital stay. Pt with generally improving abdominal pain, but fecal occult blood now positive on 7/23. PLAN - Will continue to follow GI recommendations; EGD possible, but no definite plans, given pt's clinical condition as PNA is being treated. Protonix daily. Cont. Zofran PRN for nausea, morphine PRN for pain. Will continue to replenish electrolytes as appropriate.  2. Shortness of breath/Cough: Given history of EtOH abuse and CXR, CT findings, most likely aspiration PNA of LUL. DDx also includes PE, but less likely. Subjective fevers at home, none here so far this admission. TB considered in DDx, PPD placed 7/20, negative on 7/22. Vancomycin was added as pt was not improving on Zosyn + azithromycin. Pulmonary exam slightly improved on 7/22-24 with WBC continuing to trending down. CT concerning for cavitary lesions, as above. PLAN - Will consult pulmonology (Dr. Reubin Milan), given current clinical appearance that is only mildy improved, if at all. PPD negative, but with nutrition status, perhaps pt is anergic. Will continue Zosyn (started 7/18) and Vancomycin per pharmacy (started 7/21). Supplemental O2 as needed. Azithromycin started on 7/19 for 3 days to cover atypicals, completed.  3. Anemia - Hb 11.0 in April 2013, 8.5 on admission this time. Trended down to 6.9 on 7/19. Now 7.6 on 7/20. Most likely mixed vitamin/iron deficiencies secondary to EtOH abuse and general poor PO intake;  MCV near upper limit of normal with one measurement 101.6, and iron panel on 7/21 shows low iron and TIBC but elevation of ferritin.Oral iron started 7/21, but discontinued 7/23; pt likely with iron metabolism derangement secondary to alcoholism and we do not want to iron overload her.  Stool initially were hemoccult negative, no bloody emesis. Stools now hemoccult positive on 7/23. Hb 5.6, pt transfused 2 units on 7/24 with Hb rise to 9.5. PLAN - Continue multivitamin, folate, thiamine.  Will follow-up on GI recommendations, as above.  4. EtOH abuse: Last drink >7 days ago. Unlikely to withdraw at this point if she has truly not had anything to drink more recently. Pt with some elevated BP, temp, tachycardia, sporadic increasedO2 requirement throughout admission, remains today. Pt has required Ativan x2 per CIWA protocol, prior to starting scheduled Ativan on 7/22. PLAN - Continue scheduled Ativan. Continue CIWA protocol, renewed 7/21. Multivitamin, folic acid, thiamine.  5. Tachycardia: Initially believed likely 2/2 to dehydration, although as above PE is possible but less likely. Also possible EtOH withdrawal and/or component of anemia, as above. Continue IVF, monitor.  6. Depression: Has expressed passive SI in the past as well as in clinic on 7/18. No plan vocalized. Will continue home SSRI and monitor closely.   7. HTN: Slightly low BP on admission, anti-hypertensives were held. Will restart at or before discharge pending resolution of other problems. BP remains elevated, though improved from 7/21. PLAN - Home HCTZ restarted on 7/21,  will also restart home lisinopril, 7/24.  FEN/GI: clear liquid diet, NS @ 100 mL/hr. MVI with thiamine and folic acid  PPX: Lovenox d/ced 7/23 as FOB is positive, SCD's only for now; Protonix.  Dispo: Pending further work up  Bobbye Morton, MD PGY-1, [facility] FPTS Intern pager: [phone]

## 2012-03-07 DIAGNOSIS — J189 Pneumonia, unspecified organism: Secondary | ICD-10-CM

## 2012-03-07 DIAGNOSIS — R4182 Altered mental status, unspecified: Secondary | ICD-10-CM

## 2012-03-07 DIAGNOSIS — F17200 Nicotine dependence, unspecified, uncomplicated: Secondary | ICD-10-CM

## 2012-03-07 DIAGNOSIS — F10239 Alcohol dependence with withdrawal, unspecified: Secondary | ICD-10-CM

## 2012-03-07 LAB — TYPE AND SCREEN
ABO/RH(D): O POS
Unit division: 0

## 2012-03-07 LAB — CBC
MCH: 31.6 pg (ref 26.0–34.0)
MCV: 91.8 fL (ref 78.0–100.0)
Platelets: 356 10*3/uL (ref 150–400)
RBC: 3.04 MIL/uL — ABNORMAL LOW (ref 3.87–5.11)
RDW: 17.5 % — ABNORMAL HIGH (ref 11.5–15.5)

## 2012-03-07 LAB — BASIC METABOLIC PANEL
BUN: 3 mg/dL — ABNORMAL LOW (ref 6–23)
CO2: 24 mEq/L (ref 19–32)
Calcium: 6.8 mg/dL — ABNORMAL LOW (ref 8.4–10.5)
Calcium: 7 mg/dL — ABNORMAL LOW (ref 8.4–10.5)
Creatinine, Ser: 0.59 mg/dL (ref 0.50–1.10)
Creatinine, Ser: 0.75 mg/dL (ref 0.50–1.10)
GFR calc non Af Amer: >90 mL/min (ref >90)
Glucose, Bld: 117 mg/dL — ABNORMAL HIGH (ref 70–99)
Sodium: 136 mEq/L (ref 135–145)

## 2012-03-07 MED ORDER — LISINOPRIL 20 MG PO TABS
20.0000 mg | ORAL_TABLET | Freq: Every day | ORAL | Status: DC
  Administered 2012-03-07 – 2012-03-08 (×2): 20 mg via ORAL
  Filled 2012-03-07 (×3): qty 1

## 2012-03-07 MED ORDER — POTASSIUM CHLORIDE CRYS ER 20 MEQ PO TBCR: 40.0000 meq | EXTENDED_RELEASE_TABLET | Freq: Once | ORAL | Status: DC

## 2012-03-07 MED ORDER — POTASSIUM CHLORIDE CRYS ER 20 MEQ PO TBCR
40.0000 meq | EXTENDED_RELEASE_TABLET | Freq: Once | ORAL | Status: AC
  Administered 2012-03-07: 40 meq via ORAL
  Filled 2012-03-07 (×2): qty 2

## 2012-03-07 MED ORDER — POTASSIUM CHLORIDE CRYS ER 20 MEQ PO TBCR
40.0000 meq | EXTENDED_RELEASE_TABLET | Freq: Two times a day (BID) | ORAL | Status: DC
  Administered 2012-03-07 (×2): 40 meq via ORAL
  Filled 2012-03-07 (×3): qty 2

## 2012-03-07 MED ORDER — POTASSIUM CHLORIDE CRYS ER 20 MEQ PO TBCR: 20.0000 meq | EXTENDED_RELEASE_TABLET | Freq: Once | ORAL | Status: DC

## 2012-03-07 NOTE — Progress Notes (Signed)
The financial counselor has been informed of assistance pt needs to follow up on her Medicaid application. Pt will dc home with [facility] services when pt is stable for discharge. No further CSW needs - CSW signing off.  Theresia Bough, MSW, Theresia Majors [phone]

## 2012-03-07 NOTE — Progress Notes (Signed)
Clinical Child psychotherapist (CSW) observed updated PT/OT notes that pt is in need of SNF placement vs HH. CSW visited pt room and informed pt of this update, pt states she agreeable to [facility] and provided CSW with consent to be faxed out. CSW also informed by RN that a financial counselor visited pt roday. CSW left a message for Arline Asp in financial counselor to inform of discharge planning and need for a Medicaid application/pending Medicaid number before pt an discharge. CSW will complete FL2, fax pt out and submit for a pasarr.  Theresia Bough, MSW, Theresia Majors [phone]

## 2012-03-07 NOTE — Progress Notes (Signed)
FMTS Attending Daily Note: Malgorzata Albert MD [phone] pager office [phone] I have discussed this patient with the resident and reviewed the assessment and plan as documented above. I agree wit the resident's findings and plan.  

## 2012-03-07 NOTE — Clinical Social Work Placement (Addendum)
    Clinical Social Work Department CLINICAL SOCIAL WORK PLACEMENT NOTE 03/07/2012  Patient:  [name], [name]  Account Number:  [id number] Admit date:  02/29/2012  Clinical Social Worker:  Theresia Bough, Theresia Majors  Date/time:  03/07/2012 03:13 PM  Clinical Social Work is seeking post-discharge placement for this patient at the following level of care:   SKILLED NURSING   (*CSW will update this form in Epic as items are completed)   03/07/2012  Patient/family provided with [facility] Department of Clinical Social Work's list of facilities offering this level of care within the geographic area requested by the patient (or if unable, by the patient's family).   03/07/2012 Patient/family informed of their freedom to choose among providers that offer the needed level of care, that participate in Medicare, Medicaid or managed care program needed by the patient, have an available bed and are willing to accept the patient.  03/07/2012  Patient/family informed of [facility]' ownership interest in [facility], as well as of the fact that they are under no obligation to receive care at this facility.  PASARR submitted to EDS on 03/07/2012 PASARR number received from EDS on 03/12/2012  FL2 transmitted to all facilities in geographic area requested by pt/family on  03/07/2012 FL2 transmitted to all facilities within larger geographic area on 03/07/2012  Patient informed that his/her managed care company has contracts with or will negotiate with  certain facilities, including the following:     Patient/family informed of bed offers received:  03/12/2012 Patient chooses bed at [facility]  Physician recommends and patient chooses bed at  [address]  Patient to be transferred to [place]  On 03/17/2012   Patient to be transferred to facility by [facility]  The following physician request were entered in Epic:   Additional Comments:

## 2012-03-07 NOTE — Progress Notes (Signed)
   CARE MANAGEMENT NOTE 03/07/2012  Patient:  [name], [name]   Account Number:  [id number]  Date Initiated:  03/07/2012  Documentation initiated by:  Tera Mater  Subjective/Objective Assessment:   51yo female admitted with abdominal pain.  Pt. lives with friends.     Action/Plan:   Discharge planning   Anticipated DC Date:  03/09/2012   Anticipated DC Plan:  HOME/SELF CARE      DC Planning Services  CM consult      Choice offered to / List presented to:             Status of service:  In process, will continue to follow Medicare Important Message given?   (If response is "NO", the following Medicare IM given date fields will be blank) Date Medicare IM given:   Date Additional Medicare IM given:    Discharge Disposition:    Per UR Regulation:  Reviewed for med. necessity/level of care/duration of stay  If discussed at Long Length of Stay Meetings, dates discussed:   03/07/2012    Comments:  02/2412 0948 Tera Mater, RN, BSN NCM [phone] Pt. is eligible for the [employer] if she should need this assistance.

## 2012-03-07 NOTE — Consult Note (Signed)
Name: [name] MRN: [id number] DOB: [birth year]    LOS: 7  Referring MD:  [facility] FPTS Reason for Consult: Multi-focal PNA    [facility] [facility] Note   History of Present Illness:  51 y/o F with PMH of HTN, ETOH abuse, ETOH hepatitis, breast cancer s/p lumpectomy, chemo/XRT admitted on 7/18 with a one week hx of increasing SOB, productive cough and decreased PO intake with n/v.  Also notes diarrhea.  Work up demonstrated cxr with dense PNA in LUL. Further evaluated with CT chest on 7/23 with dense L PNA and cavitation.  7/24 PCCM consulted for multifocal PNA.   Cultures: Sputum>>>  Antibiotics: 7/19 Azithro>>> 7/18 Zosyn>>> 7/21 Vacno>>>  Tests / Events: 7/23 CT Chest>>>Progressive multifocal pneumonia on the left with consolidation, cavitation and lobar expansion. These features are most suggestive of infection with a gram-negative organism (Klebsiella, E Coli or Pseudomonas).  Associated small right greater than left pleural effusions and probable reactive mediastinal lymphadenopathy.  Subjective: Denies acute complaints.  Indicates dry cough - no sputum production.   Vital Signs: Temp:  [98.8 F (37.1 C)-100.4 F (38 C)] 99.1 F (37.3 C) (07/25 1417) Pulse Rate:  [99-106] 99  (07/25 1417) Resp:  [18-20] 20  (07/25 1417) BP: (130-150)/(84-106) 130/84 mmHg (07/25 1417) SpO2:  [96 %-97 %] 96 % (07/25 1417) Weight:  [109 lb 8 oz (49.669 kg)] 109 lb 8 oz (49.669 kg) (07/25 0439) I/O last 3 completed shifts: In: 1673 [P.O.:120; I.V.:253; Blood:1300] Out: 2150 [Urine:2150]  Physical Examination: General: thin adult female in NAD, temporal wasting noted Neuro: awakens, more alert / interactive CV: s1s2 rrr, no m/r/g PULM: resp's even/non-labored, left coarse rhonchi GI: flat, soft, bsx4 active Extremities: warm/dry, no edema  Labs    CBC  Lab 03/07/12 0852 03/06/12 0640 03/05/12 1739  HGB 9.6* 9.5* 5.6*  HCT 27.9* 26.8* 16.9*  WBC 12.7* 12.1* 11.9*  PLT 356 359 376    BMET  Lab 03/07/12 0852 03/06/12 0640 03/05/12 0550 03/04/12 0555 03/03/12 1013 03/02/12 0720 03/01/12 1616 02/29/12 2112  NA 138 140 138 138 136 -- -- --  K 2.8* 3.5 -- -- -- -- -- --  CL 102 107 109 105 102 -- -- --  CO2 24 18* 18* 15* 17* -- -- --  GLUCOSE 106* 75 108* 74 80 -- -- --  BUN 3* 5* 9 13 17  -- -- --  CREATININE 0.59 0.51 0.47* 0.50 0.56 -- -- --  CALCIUM 6.8* 6.4* 5.9* 6.4* 6.6* -- -- --  MG -- -- 0.6* -- -- 1.7 3.4* --  PHOS -- -- -- -- -- -- -- 1.9*    No results found for this basename: INR:5 in the last 168 hours  No results found for this basename: PHART:5,PCO2:5,PCO2ART:5,PO2:5,PO2ART:5,HCO3:5,TCO2:5,O2SAT:5 in the last 168 hours   Radiology: 7/21 CXR>>>Significantly worsening aeration throughout the left lung, compatible with progressive multilobar pneumonia. . New small bilateral pleural effusions.  Atherosclerosis  7/23 CT Chest>>>Progressive multifocal pneumonia on the left with consolidation, cavitation and lobar expansion. These features are most suggestive of infection with a gram-negative organism (Klebsiella, E Coli or Pseudomonas).  Associated small right greater than left pleural effusions and probable reactive mediastinal lymphadenopathy.  Assessment and Plan: Principal Problem:  *Pneumonia Active Problems:  Cholelithiasis  Abnormal finding on GI tract imaging  Necrotizing pneumonia  Hypoxemia  Alcohol withdrawal  Altered mental status  Left cavitary PNA  Assessment: Extensive L PNA with cavitations.  DDx includes hx of ETOH abuse with aspiration, CAP,  less likely TB.  HIV negative 7/18.  Denies IV drug use.  PPD negative.    Plan: -continue current abx, likely will need PICC line for extended IV abx -PPD negative -serial cxr's -will need repeat CT in 3 months or based on clinical status -resp /sputum culture (if patient able to produce) -caution with ativan and sedating effects given current respiratory status.  -no indication for  bronchoscopy at this time  ETOH Hx Plan: -monitor for w/d symptoms -ativan -?if alzheimer's type picture with her mental status.  ? What is her baseline mental status.  -agree that she would greatly benefit with placement due to self care deficits.    Canary Brim, NP-C [facility] Pgr: [phone]  03/07/2012, 3:06 PM    Attending Addendum:  I have seen the patient, discussed the issues, test results and plans with B. Veleta Miners, NP. I agree with the Assessment and Plans as ammended above.   Levy Pupa, MD, PhD 03/07/2012, 3:36 PM [facility] [phone] or if no answer [phone]

## 2012-03-07 NOTE — Progress Notes (Signed)
Was informed by financial counselor that pt does not live at the address that is listed in her demographics.  Pt goes to a friends house to change clothes and then leaves.  Pt does not have a home to sleep in per financial advisor.

## 2012-03-07 NOTE — Progress Notes (Signed)
FMTS Attending Daily Note: Justus Duerr MD [phone] pager office [phone] I have discussed this patient with the resident and reviewed the assessment and plan as documented above. I agree wit the resident's findings and plan.  

## 2012-03-07 NOTE — Evaluation (Signed)
Occupational Therapy Evaluation Patient Details Name: [name] MRN: [id number] DOB: Jun 30, 1961 Today's Date: 03/07/2012 Time: 4098-1191 OT Time Calculation (min): 29 min  OT Assessment / Plan / Recommendation Clinical Impression  Pt  is a 51 year old woman admitted abdominal pain, anorexia, and PNA who also has multiple comorbidities including alcohol abuse.  Pt is a high fall risk with cognitive deficits.  She requires 24 hour capable assist for d/c home which is not available.  Will follow acutely for ADL training to decrease burden of care in next environment and increase safety.    OT Assessment  Patient needs continued OT Services    Follow Up Recommendations  Skilled nursing facility    Barriers to Discharge Decreased caregiver support    Equipment Recommendations  Defer to next venue    Recommendations for Other Services    Frequency  Min 2X/week    Precautions / Restrictions Precautions Precautions: Fall Restrictions Weight Bearing Restrictions: No   Pertinent Vitals/Pain No pain    ADL  Eating/Feeding: Simulated;Independent Where Assessed - Eating/Feeding: Bed level Grooming: Simulated;Min guard;Wash/dry hands Where Assessed - Grooming: Unsupported standing Upper Body Bathing: Simulated;Minimal assistance Where Assessed - Upper Body Bathing: Unsupported sitting Lower Body Bathing: Simulated;Minimal assistance Where Assessed - Lower Body Bathing: Unsupported sit to stand Upper Body Dressing: Performed;Minimal assistance Where Assessed - Upper Body Dressing: Unsupported sitting Lower Body Dressing: Performed;Minimal assistance Where Assessed - Lower Body Dressing: Unsupported sit to stand Toileting - Clothing Manipulation and Hygiene: Simulated;Minimal assistance Where Assessed - Toileting Clothing Manipulation and Hygiene: Standing Transfers/Ambulation Related to ADLs: Min assist to ambulate, take slow, shuffling steps ADL Comments: Pt is incontinent of  bowel and bladder.    OT Diagnosis: Generalized weakness;Cognitive deficits  OT Problem List: Impaired balance (sitting and/or standing);Decreased cognition;Decreased safety awareness;Decreased activity tolerance OT Treatment Interventions: Self-care/ADL training;Cognitive remediation/compensation;Balance training   OT Goals Acute Rehab OT Goals OT Goal Formulation: With patient Time For Goal Achievement: 03/21/12 Potential to Achieve Goals: Fair ADL Goals Pt Will Perform Grooming: with supervision;Standing at sink;Unsupported ADL Goal: Grooming - Progress: Goal set today Pt Will Perform Upper Body Bathing: with supervision;Sitting, edge of bed ADL Goal: Upper Body Bathing - Progress: Goal set today Pt Will Perform Lower Body Bathing: with supervision;Sit to stand from bed ADL Goal: Lower Body Bathing - Progress: Goal set today Pt Will Perform Upper Body Dressing: with set-up;Sitting, bed ADL Goal: Upper Body Dressing - Progress: Goal set today Pt Will Perform Lower Body Dressing: with supervision;Sit to stand from bed ADL Goal: Lower Body Dressing - Progress: Goal set today Pt Will Transfer to Toilet: with supervision;Ambulation;Regular height toilet ADL Goal: Toilet Transfer - Progress: Goal set today Pt Will Perform Toileting - Clothing Manipulation: with supervision;Standing ADL Goal: Toileting - Clothing Manipulation - Progress: Goal set today Pt Will Perform Toileting - Hygiene: with supervision;Sitting on 3-in-1 or toilet ADL Goal: Toileting - Hygiene - Progress: Goal set today Miscellaneous OT Goals Miscellaneous OT Goal #1: Pt will adhere to safety precautions related to fall risk during ADL. OT Goal: Miscellaneous Goal #1 - Progress: Goal set today  Visit Information  Last OT Received On: 03/07/12 Assistance Needed: +1 PT/OT Co-Evaluation/Treatment: Yes    Subjective Data  Subjective: "I need another one of those pads." Patient Stated Goal: Home with friend.   Prior  Functioning  Vision/Perception  Home Living Lives With: Friend(s) Available Help at Discharge: Friend(s);Available PRN/intermittently Type of Home: House Home Access: Level entry Home Layout: One level Bathroom  Shower/Tub: Network engineer: None Prior Function Level of Independence: Independent Able to Take Stairs?: Yes Driving: No Vocation: Self employed Communication Communication: Expressive difficulties Dominant Hand: Right      Cognition  Overall Cognitive Status: Impaired Area of Impairment: Awareness of deficits Arousal/Alertness: Awake/alert Orientation Level: Appears intact for tasks assessed Behavior During Session: Flat affect Current Attention Level: Sustained Attention - Other Comments: slow to process cues/instructions Awareness of Deficits: Pt unaware of balance deficits and fall risk.    Extremity/Trunk Assessment Right Upper Extremity Assessment RUE ROM/Strength/Tone: [facility] for tasks assessed Left Upper Extremity Assessment LUE ROM/Strength/Tone: WFL for tasks assessed Trunk Assessment Trunk Assessment: Normal   Mobility Bed Mobility Bed Mobility: Supine to Sit Supine to Sit: 5: Supervision;HOB elevated;With rails Sitting - Scoot to Edge of Bed: 5: Supervision Transfers Sit to Stand: 4: Min guard;With upper extremity assist;From bed;From chair/3-in-1 Stand to Sit: 4: Min guard;With upper extremity assist;With armrests;To chair/3-in-1;To bed   Exercise    Balance Balance Balance Assessed: Yes Dynamic Sitting Balance Dynamic Sitting - Balance Support: Feet supported Dynamic Sitting - Level of Assistance: 5: Stand by assistance Dynamic Sitting - Balance Activities: Forward lean/weight shifting  End of Session OT - End of Session Activity Tolerance: Patient tolerated treatment well Patient left: in chair;with call bell/phone within reach Nurse Communication: Other (comment) (pt cognition, incontinence)    GO     Evern Bio 03/07/2012, 10:03 AM [phone]

## 2012-03-07 NOTE — Progress Notes (Signed)
I cosign for Karen Wall's assessment, med administration, notes, I/O, and care plan education. 

## 2012-03-07 NOTE — Progress Notes (Addendum)
Physical Therapy Treatment Patient Details Name: VALENCIA KASSA MRN: [id number] DOB: 1961/04/05 Today's Date: 03/07/2012 Time: 0836-0900 PT Time Calculation (min): 24 min  PT Assessment / Plan / Recommendation Comments on Treatment Session  Pt able to progress with mobilty today.  More interactive but cont's to be fairly quiet.  Although pt is improving with mobilty, this clinician feels pt would strongly benefit from SNF to maximize strength, balance, functional independence, & safety.  If pt does return home she will need 24 hr assist & HHPT.      Follow Up Recommendations  Skilled nursing facility    Barriers to Discharge        Equipment Recommendations  Defer to next venue    Recommendations for Other Services    Frequency Min 3X/week   Plan Discharge plan needs to be updated    Precautions / Restrictions Precautions Precautions: Fall Restrictions Weight Bearing Restrictions: No       Mobility  Bed Mobility Bed Mobility: Supine to Sit Supine to Sit: 5: Supervision;HOB elevated;With rails Sitting - Scoot to Edge of Bed: 5: Supervision Transfers Transfers: Sit to Stand;Stand to Sit Sit to Stand: 4: Min guard;With upper extremity assist;From bed Stand to Sit: 4: Min guard;With upper extremity assist;With armrests;To chair/3-in-1 Details for Transfer Assistance: VC's for hand placement.  No assist needed for balance with standing but minor unsteadiness still noted with initial standing.   Ambulation/Gait Ambulation/Gait Assistance: 4: Min assist Ambulation Distance (Feet): 150 Feet Assistive device: None Ambulation/Gait Assistance Details: Pt ambulated with no AD.  Minor assist for balance.  Pt with stiff posture & shuffles feet.  Increased steadiness noted today compared to last PT session but still mildly unsteady.  Pt deferring use of RW.   Gait Pattern: Shuffle;Decreased stride length Stairs: No Wheelchair Mobility Wheelchair Mobility: No      PT Goals Acute  Rehab PT Goals Time For Goal Achievement: 03/08/12 Potential to Achieve Goals: Good PT Goal: Sit to Stand - Progress: Progressing toward goal PT Goal: Stand to Sit - Progress: Progressing toward goal PT Goal: Ambulate - Progress: Progressing toward goal  Visit Information  Last PT Received On: 03/07/12 Assistance Needed: +1    Subjective Data      Cognition  Overall Cognitive Status: Impaired Area of Impairment: Awareness of deficits Arousal/Alertness: Awake/alert Orientation Level: Appears intact for tasks assessed Behavior During Session: Flat affect Current Attention Level: Sustained Attention - Other Comments: slow to process cues/instructions Awareness of Deficits: Pt unaware of balance deficits and fall risk.    Balance  Balance Balance Assessed: Yes Dynamic Sitting Balance Dynamic Sitting - Balance Support: Feet supported Dynamic Sitting - Level of Assistance: 5: Stand by assistance Dynamic Sitting - Balance Activities: Forward lean/weight shifting Static Standing Balance Static Standing - Balance Support: No upper extremity supported Static Standing - Level of Assistance: 5: Stand by assistance  End of Session PT - End of Session Equipment Utilized During Treatment: Gait belt Activity Tolerance: Patient tolerated treatment well Patient left: in chair;with call bell/phone within reach;with nursing in room Nurse Communication: Mobility status    Verdell Face, [place] [phone] 03/07/2012

## 2012-03-07 NOTE — Progress Notes (Signed)
Family Medicine Teaching Service Daily Progress Note Intern Pager: [phone]  Patient name: [name] Medical record number: [id number] Date of birth: [birth year] Age: 51 y.o. Gender: female  Primary Care Provider: MATTHEWS,CODY, DO  Subjective: Pt seen at bedside. Pt states she feels slightly better today than previous days. Still with decreased appetite but better. Breathing still difficult but not as bad. Still with some abdominal/left side pain, especially when she moves.  Per PT, pt still unsteady on her feet but ambulating okay with assistance. Appeared better on initial evaluation 7/22 than yesterday and today, but improving slightly.  Objective: Temp:  [98.3 F (36.8 C)-100.4 F (38 C)] 98.8 F (37.1 C) (07/25 0439) Pulse Rate:  [100-106] 103  (07/25 0439) Resp:  [18-20] 20  (07/25 0439) BP: (125-143)/(68-102) 143/101 mmHg (07/25 0439) SpO2:  [96 %-100 %] 97 % (07/25 0439) Weight:  [109 lb 8 oz (49.669 kg)] 109 lb 8 oz (49.669 kg) (07/25 0439) Exam: General: pt initially asleep when examiner entered room, lying in bed, appears mildly uncomfortable, coarse tremor in arms more apparent when awak Cardiovascular: increased rate, no murmur appreciated; unchanged from previous days Respiratory: decreased breath sounds bilaterally but clearer than previous days, better on right than left; some soft, scattered wheezes remain, rhonchi not as bad as on 7/24 Abdomen: soft, mild tenderness to deep palpation on left, otherwise not as tender as yesterday Musculokeletal: tenderness along and under ribs on left side, as well as slight parasternal tenderness, mildly improved Extremities: moves all extremities equally, distal pulses  Laboratory:  Lab 03/07/12 0852 03/06/12 0640 03/05/12 1739  WBC 12.7* 12.1* 11.9*  HGB 9.6* 9.5* 5.6*  HCT 27.9* 26.8* 16.9*  PLT 356 359 376    Lab 03/06/12 0640 03/05/12 0550 03/04/12 0555 03/03/12 1013  NA 140 138 138 --  K 3.5 3.4* 3.1* --  CL 107  109 105 --  CO2 18* 18* 15* --  BUN 5* 9 13 --  CREATININE 0.51 0.47* 0.50 --  CALCIUM 6.4* 5.9* 6.4* --  PROT 6.2 5.8* -- 6.5  BILITOT 1.0 1.0 -- 1.4*  ALKPHOS 64 59 -- 101  ALT 13 14 -- 17  AST 27 31 -- 55*  GLUCOSE 75 108* 74 --   HIV: nonreactive, 7/18 Fecal occult blood:  Negative on 7/20  POSITIVE on 7/23  Imaging/Diagnostic Tests: CXR, 7/18 @1424  IMPRESSION:  Dense pneumonia in the left lung, probably the left upper lobe.  CT, 7/18 @2028  IMPRESSION:  Dense left upper lobe and superior segment left lower lobe  consolidation, most compatible with pneumonia. Collapse with  proximal endobronchial lesion is felt less likely.  Gallstones with possible choledocholithiasis or volume averaging  from pancreatic calcification. Consider MRCP for further  evaluation.  Hepatic steatosis.  Trace upper abdominal ascites.  MRCP, 7/19 @1243  IMPRESSION:  1. Moderate to severely degraded exam, secondary to motion and  inability to hold breath.  2. No biliary ductal dilatation. Distal common duct findings,  which when combined with CT findings, are most consistent with  small distal common duct stones. Concurrent cholelithiasis, as on CT.  3. Findings of chronic pancreatitis, without definite acute  superimposed process.  4. Marked hepatic steatosis.  5. Left base air space disease and adjacent fluid, as before.  6. Renal and perirenal edema with suggestion of decreased contrast  excretion on delayed images. Question acute or subacute renal  Insufficiency.  CXR, 7/21 IMPRESSION:  1. Significantly worsening aeration throughout the left lung,  compatible with progressive multilobar  pneumonia.  2. New small bilateral pleural effusions.  3. Atherosclerosis.   Assessment/Plan: [name] is a 51yo female with PMH significant for HTN, EtOH abuse/alcoholic hepatitis, and breast CA (2001, s/p lumpectomy, chemo, and radiation therapy) presenting with inability to tolerate PO with  shortness of breath. Admitted from clinic on 7/18.   1. Abdominal pain/anorexia: Patient with history of EtOH abuse and EtOH induced hepatitis, now with abdominal pain, icterus and inability to tolerate PO. Broad differential including pancreatitis, hepatitis, malignancy, gallstones, gastritis. Admitted to telemetry and for hydration. CT shows LUL PNA, gallstones with possible choledocolithiasis and hepatic steatosis with trace upper abd ascites. MRCP on 7/19 is a generally poor study, shows current cholelithiasis and chronic pancreatitis and nonspecific distal common duct findings. Requiring replenishment of K and Mg during hospital stay. Pt with generally improving abdominal pain, but fecal occult blood positive on 7/23; Hb stable s/p 2 units pRBC transfused 7/24. PLAN - GI recommendations appreciated, will likely refer pt for outpt follow-up per Dr. Marzetta Board note, 7/24. Protonix daily. Cont. Zofran PRN for nausea, morphine PRN for pain. Will continue to replenish electrolytes as appropriate.  2. Shortness of breath/Cough: CXR/CT findings PNA of LUL with cavitation. DDx also included PE, but less likely. Initially thought to be related to aspiration due to hx of alcoholic abuse, but this is less clear, now. Subjective fevers at home, none here so far this admission. TB considered in DDx, PPD placed 7/20, negative on 7/22. Vancomycin was added as pt was not improving on Zosyn + azithromycin. PPD negative, but with nutrition status, perhaps pt is anergic. Pulmonology was consulted 7/25, given current clinical appearance that is only mildy improved, if at all, with evidence of cavitary lesions in lung as above.  Pulmonolgy (Dr. Molli Knock) recommended tracheal aspirate with respiratory culture. PLAN - Appreciate pulmonology recommendations; will clarify tracheal aspirate/culture with them. Will  Will continue Zosyn (started 7/18) and Vancomycin per pharmacy (started 7/21). Supplemental O2 as needed. Azithromycin  started on 7/19 for 3 days to cover atypicals, completed.  3. Anemia - Hb 11.0 in April 2013, 8.5 on admission this time. Trended down to 6.9 on 7/19. Now 7.6 on 7/20. Most likely mixed vitamin/iron deficiencies secondary to EtOH abuse and general poor PO intake; MCV near upper limit of normal with one measurement 101.6, and iron panel on 7/21 shows low iron and TIBC but elevation of ferritin.Oral iron started 7/21, but discontinued 7/23; pt likely with iron metabolism derangement secondary to alcoholism and we do not want to iron overload her.  Stool initially were hemoccult negative, no bloody emesis. Stool hemoccult positive on 7/23. Hb 5.6, pt transfused 2 units on 7/24 with Hb rise to 9.5. Hb stable, 7/25 at 9.6. PLAN - Continue multivitamin, folate, thiamine. Will likely refer pt for outpt GI follow-up, as above.  4. EtOH abuse: Last drink >7 days ago. Unlikely to withdraw at this point if she has truly not had anything to drink more recently. Pt with some elevated BP, temp, tachycardia, sporadic increasedO2 requirement throughout admission, remains today. Pt has required Ativan x2 per CIWA protocol, prior to starting scheduled Ativan on 7/22. PLAN - Continue scheduled Ativan. Continue CIWA protocol, renewed 7/21. Multivitamin, folic acid, thiamine.  5. Tachycardia: Initially believed likely 2/2 to dehydration, although as above PE is possible but less likely; no further evidence of PE. Also possible EtOH withdrawal and/or component of anemia, as above. Continue IVF, monitor.  6. Depression: Has expressed passive SI in the past as well  as in clinic on 7/18. No plan vocalized. Will continue home SSRI and monitor closely.   7. HTN: Slightly low BP on admission, anti-hypertensives were held, then intermittent elevated pressures. Home meds restarted as pt has clinically begun to improve slowly. PLAN - Home HCTZ restarted on 7/21, restarting lisinopril 7/25.  8. PT/OT - Recommending HH PT with 24h  supervision. Will speak with CSW about possible placement; pt still not ready for discharge, medically, as PNA is still requiring active treatment.  FEN/GI: clear liquid diet, NS @ 100 mL/hr. MVI with thiamine and folic acid  PPX: Lovenox d/ced 7/23 as FOB is positive, SCD's only for now; Protonix.  Dispo: Pending further work up  Bobbye Morton, MD PGY-1, [facility] FPTS Intern pager: [phone]

## 2012-03-08 ENCOUNTER — Inpatient Hospital Stay (HOSPITAL_COMMUNITY): Payer: Self-pay

## 2012-03-08 LAB — CBC
HCT: 26 % — ABNORMAL LOW (ref 36.0–46.0)
Hemoglobin: 9 g/dL — ABNORMAL LOW (ref 12.0–15.0)
MCHC: 34.6 g/dL (ref 30.0–36.0)
RBC: 2.76 MIL/uL — ABNORMAL LOW (ref 3.87–5.11)
WBC: 12.8 10*3/uL — ABNORMAL HIGH (ref 4.0–10.5)

## 2012-03-08 LAB — BASIC METABOLIC PANEL
BUN: 4 mg/dL — ABNORMAL LOW (ref 6–23)
CO2: 25 mEq/L (ref 19–32)
Chloride: 105 mEq/L (ref 96–112)
Glucose, Bld: 82 mg/dL (ref 70–99)
Potassium: 3.3 mEq/L — ABNORMAL LOW (ref 3.5–5.1)
Sodium: 141 mEq/L (ref 135–145)

## 2012-03-08 MED ORDER — DIPHENHYDRAMINE HCL 25 MG PO CAPS: 25.0000 mg | ORAL_CAPSULE | Freq: Four times a day (QID) | ORAL | Status: DC | PRN

## 2012-03-08 MED ORDER — SODIUM CHLORIDE 0.9 % IJ SOLN
10.0000 mL | INTRAMUSCULAR | Status: DC | PRN
  Administered 2012-03-10 – 2012-03-17 (×7): 10 mL

## 2012-03-08 MED ORDER — POTASSIUM CHLORIDE CRYS ER 20 MEQ PO TBCR
60.0000 meq | EXTENDED_RELEASE_TABLET | Freq: Two times a day (BID) | ORAL | Status: DC
  Administered 2012-03-08 – 2012-03-12 (×10): 60 meq via ORAL
  Filled 2012-03-08 (×10): qty 3
  Filled 2012-03-08: qty 2
  Filled 2012-03-08 (×2): qty 3

## 2012-03-08 MED ORDER — IBUPROFEN 600 MG PO TABS
600.0000 mg | ORAL_TABLET | Freq: Four times a day (QID) | ORAL | Status: DC | PRN
  Filled 2012-03-08: qty 1

## 2012-03-08 NOTE — Progress Notes (Signed)
Patient's sister is currently visiting the patient and stated that she would like to speak with physician and social worker in reference to patient.  MD called and transferred to the patient's room to speak with the patient's sister and a message has been left on the social worker's phone to call the patient's nurse    D. Manson Passey RN

## 2012-03-08 NOTE — Progress Notes (Signed)
Name: [name] MRN: [id number] DOB: [birth year]    LOS: 8  Referring MD:  [facility] FPTS Reason for Consult: Multi-focal PNA    [facility]   History of Present Illness:  51 y/o F with PMH of HTN, ETOH abuse, ETOH hepatitis, breast cancer s/p lumpectomy, chemo/XRT admitted on 7/18 with a one week hx of increasing SOB, productive cough and decreased PO intake with n/v.  Also notes diarrhea.  Work up demonstrated cxr with dense PNA in LUL. Further evaluated with CT chest on 7/23 with dense L PNA and cavitation.  7/24 PCCM consulted for multifocal PNA.   Cultures: Sputum>>>Pending  Antibiotics: 7/19 Azithro>>> 7/18 Zosyn>>> 7/21 Vacno>>>  Tests / Events: 7/23 CT Chest>>>Progressive multifocal pneumonia on the left with consolidation, cavitation and lobar expansion. These features are most suggestive of infection with a gram-negative organism (Klebsiella, E Coli or Pseudomonas).  Associated small right greater than left pleural effusions and probable reactive mediastinal lymphadenopathy.  Subjective: Denies acute complaints.  Indicates dry cough - no sputum production.   Vital Signs: Temp:  [97.6 F (36.4 C)-99.1 F (37.3 C)] 97.7 F (36.5 C) (07/26 1005) Pulse Rate:  [93-112] 93  (07/26 1005) Resp:  [20] 20  (07/26 1005) BP: (130-157)/(84-111) 154/111 mmHg (07/26 1005) SpO2:  [96 %-100 %] 98 % (07/26 1005) Weight:  [50.2 kg (110 lb 10.7 oz)] 50.2 kg (110 lb 10.7 oz) (07/26 0445) I/O last 3 completed shifts: In: 7919.3 [P.O.:360; I.V.:6359.3; IV Piggyback:1200] Out: 1227 [Urine:1225; Stool:2]  Physical Examination: General: thin adult female in NAD, temporal wasting noted Neuro: awakens, more alert / interactive CV: s1s2 rrr, no m/r/g PULM: resp's even/non-labored, left coarse rhonchi GI: flat, soft, bsx4 active Extremities: warm/dry, no edema  Labs    CBC  Lab 03/08/12 0640 03/07/12 0852 03/06/12 0640  HGB 9.0* 9.6* 9.5*  HCT 26.0* 27.9* 26.8*  WBC 12.8*  12.7* 12.1*  PLT 323 356 359   BMET  Lab 03/08/12 0640 03/07/12 1626 03/07/12 0852 03/06/12 0640 03/05/12 0550 03/02/12 0720 03/01/12 1616  NA 141 136 138 140 138 -- --  K 3.3* 3.7 -- -- -- -- --  CL 105 101 102 107 109 -- --  CO2 25 23 24  18* 18* -- --  GLUCOSE 82 117* 106* 75 108* -- --  BUN 4* 3* 3* 5* 9 -- --  CREATININE 0.86 0.75 0.59 0.51 0.47* -- --  CALCIUM 7.3* 7.0* 6.8* 6.4* 5.9* -- --  MG -- 2.3 -- -- 0.6* 1.7 3.4*  PHOS -- -- -- -- -- -- --    No results found for this basename: INR:5 in the last 168 hours  No results found for this basename: PHART:5,PCO2:5,PCO2ART:5,PO2:5,PO2ART:5,HCO3:5,TCO2:5,O2SAT:5 in the last 168 hours   Radiology: 7/21 CXR>>>Significantly worsening aeration throughout the left lung, compatible with progressive multilobar pneumonia. . New small bilateral pleural effusions.  Atherosclerosis  7/23 CT Chest>>>Progressive multifocal pneumonia on the left with consolidation, cavitation and lobar expansion. These features are most suggestive of infection with a gram-negative organism (Klebsiella, E Coli or Pseudomonas).  Associated small right greater than left pleural effusions and probable reactive mediastinal lymphadenopathy.  Assessment and Plan: Principal Problem:  *Pneumonia Active Problems:  Cholelithiasis  Abnormal finding on GI tract imaging  Necrotizing pneumonia  Hypoxemia  Alcohol withdrawal  Altered mental status  Left cavitary PNA  Assessment: Extensive L PNA with cavitations.  DDx includes hx of ETOH abuse with aspiration, CAP, less likely TB.  HIV negative 7/18.  Denies IV  drug use.  PPD negative.    Plan: -Continue current abx, likely will need PICC line for extended IV abx once cultures are resulted to be able to narrow. -Will need repeat CXR on Monday for evaluation of necrotization. -PPD negative. -Will need repeat CT in 3 months or based on clinical status -Resp /sputum culture pending. -Caution with ativan and sedating  effects given current respiratory status.  -No indication for bronchoscopy at this time, this is not a post obstructive type pneumonia.  ETOH Hx Plan: -Monitor for w/d symptoms -Ativan judiciously. -?if alzheimer's type picture with her mental status.  ? What is her baseline mental status.  -Agree that she would greatly benefit with placement due to self care deficits.   PCCM will see again on Monday.  Alyson Reedy, M.D. [facility] Pulmonary/Critical Care Medicine. Pager: [phone]. After hours pager: [phone].

## 2012-03-08 NOTE — Progress Notes (Signed)
Patient continues to get out of bed and bed alarm continues to go off, patient refused to stay in bed or let staff clean her bed and gown from bowel movement; Charge nurse had to be called to help assist with patient; will continue to monitor patient       D. Manson Passey RN

## 2012-03-08 NOTE — Progress Notes (Signed)
Halana Deisher In[place],PT Acute Rehabilitation [phone] (pager)  

## 2012-03-08 NOTE — Progress Notes (Signed)
Physical Therapy Treatment Patient Details Name: [name] MRN: [id number] DOB: Jul 14, 1961 Today's Date: 03/08/2012 Time: 7829-5621 PT Time Calculation (min): 8 min  PT Assessment / Plan / Recommendation Comments on Treatment Session  RN agreeable to allow pt to participate and then return to assess IV site, however pt stating she only wanted to get back in bed. Attempted to instruct pt in bed exercises she could perform and pt reported she was too tired right now. Agreeable to PT to return later as schedule allows.    Follow Up Recommendations  Skilled nursing facility    Barriers to Discharge        Equipment Recommendations  Defer to next venue    Recommendations for Other Services    Frequency Min 3X/week   Plan Discharge plan remains appropriate;Frequency remains appropriate    Precautions / Restrictions Precautions Precautions: Fall   Pertinent Vitals/Pain     Mobility  Bed Mobility Bed Mobility: Sit to Supine;Scooting to HOB Sit to Supine: 4: Min guard;HOB flat Scooting to HOB: 3: Mod assist Details for Bed Mobility Assistance: Pt struggling to lift her legs up onto bed due to weakness; required repeated trials and nearly needed assist; Pt with difficulty processing how to scoot herself up to [facility] and after several independent trials, required mod assist and use of pad to move to [facility] Transfers Transfers: Sit to Stand;Stand to Sit Sit to Stand: 4: Min guard;With upper extremity assist;With armrests;From chair/3-in-1 Stand to Sit: 4: Min guard;With upper extremity assist;To bed Details for Transfer Assistance: pt on [facility] with RN present assessing pt for new IV; required cues for hand placement and assist for balance as turning to bed; pt with uncontrolled descent and poor awareness of IV line/tubing Ambulation/Gait Ambulation/Gait Assistance: Not tested (comment) (pt refused)    Exercises Low Level/ICU Exercises Stabilized Bridging: AROM;Strengthening;Both;Other  reps (comment);Supine (3 reps in conjunction with education to scoot to [facility])   PT Diagnosis:    PT Problem List:   PT Treatment Interventions:     PT Goals Acute Rehab PT Goals PT Goal Formulation: With patient Time For Goal Achievement: 03/15/12 Potential to Achieve Goals: Good Pt will Sit at [facility] of Bed: with supervision;3-5 min;with no upper extremity support;Other (comment) (and reach >6 in outside BOS) PT Goal: Sit at [facility] Of Bed - Progress: Goal set today Pt will go Sit to Supine/Side: with modified independence;with HOB 0 degrees PT Goal: Sit to Supine/Side - Progress: Goal set today Pt will go Sit to Stand: with modified independence;with upper extremity assist PT Goal: Sit to Stand - Progress: Goal set today Pt will go Stand to Sit: with modified independence;without upper extremity assist PT Goal: Stand to Sit - Progress: Goal set today Pt will Ambulate: >150 feet;with supervision;with least restrictive assistive device;Other (comment);with gait velocity >(comment) ft/second (vs no device; velocity >2.64ft/sec) PT Goal: Ambulate - Progress: Goal set today Pt will Perform Home Exercise Program: with supervision, verbal cues required/provided PT Goal: Perform Home Exercise Program - Progress: Goal set today  Visit Information  Last PT Received On: 03/08/12 Assistance Needed: +1    Subjective Data  Subjective: I'm too tired   Cognition  Overall Cognitive Status: Impaired Area of Impairment: Awareness of deficits Arousal/Alertness: Awake/alert Behavior During Session: Flat affect Current Attention Level: Sustained Attention - Other Comments: slow to process cues/instructions Awareness of Deficits: Pt unaware of balance deficits and fall risk.    Balance  Balance Balance Assessed: Yes Static Sitting Balance Static Sitting -  Balance Support: No upper extremity supported;Feet supported Static Sitting - Level of Assistance: 5: Stand by assistance Static Sitting -  Comment/# of Minutes: x as RN assessing IV site; pt with tremors and posterior bias with appearance she may lose her balance and fall backwards Static Standing Balance Static Standing - Balance Support: No upper extremity supported Static Standing - Level of Assistance: 4: Min assist  End of Session PT - End of Session Activity Tolerance: Patient limited by fatigue Patient left: in bed;with nursing in room   GP     Mustaf Antonacci 03/08/2012, 11:49 AM Pager [phone]

## 2012-03-08 NOTE — Progress Notes (Signed)
Peripherally Inserted Central Catheter/Midline Placement  The IV Nurse has discussed with the patient and/or persons authorized to consent for the patient, the purpose of this procedure and the potential benefits and risks involved with this procedure.  The benefits include less needle sticks, lab draws from the catheter and patient may be discharged home with the catheter.  Risks include, but not limited to, infection, bleeding, blood clot (thrombus formation), and puncture of an artery; nerve damage and irregular heat beat.  Alternatives to this procedure were also discussed. Discussed with patient and sister.  PICC/Midline Placement Documentation        [name] 03/08/2012, 6:32 PM

## 2012-03-08 NOTE — Progress Notes (Signed)
FMTS Attending Daily Note: Sara Neal MD [phone] pager office [phone] I  have seen and examined this patient, reviewed their chart. I have discussed this patient with the resident. I agree with the resident's findings, assessment and care plan. 

## 2012-03-08 NOTE — Progress Notes (Signed)
Utilization Review Completed.  

## 2012-03-08 NOTE — Progress Notes (Signed)
Family Medicine Teaching Service Daily Progress Note Intern Pager: [phone]  Patient name: [name] Medical record number: [id number] Date of birth: 1961-04-03 Age: 51 y.o. Gender: female  Primary Care Provider: MATTHEWS,CODY, DO  Subjective: Pt seen at bedside. Pt states she feels better again, today. She also feels like she could try more substantial food than clear liquids. Breathing is a little better, as is pain.  Objective: Temp:  [97.6 F (36.4 C)-99.1 F (37.3 C)] 97.6 F (36.4 C) (07/26 0445) Pulse Rate:  [98-112] 103  (07/26 0445) Resp:  [20] 20  (07/26 0445) BP: (130-157)/(84-109) 147/100 mmHg (07/26 0445) SpO2:  [96 %-100 %] 100 % (07/26 0445) Weight:  [110 lb 10.7 oz (50.2 kg)] 110 lb 10.7 oz (50.2 kg) (07/26 0445) Exam: General: pt generally more alert and easier to understand, this morning, especially compared to the last week Cardiovascular: increased rate, no murmur appreciated; unchanged from previous days Respiratory: decreased breath sounds remain, left worse than right; coarse crackles present in full left lung fields, though less prominent than 7/25 Abdomen: soft, minimal tenderness mostly on left side Musculokeletal: minimal tenderness along left side/ribs and parasternally Extremities: moves all extremities equally, distal pulses 2+, intact/symmetric  Laboratory:  Lab 03/08/12 0640 03/07/12 0852 03/06/12 0640  WBC 12.8* 12.7* 12.1*  HGB 9.0* 9.6* 9.5*  HCT 26.0* 27.9* 26.8*  PLT 323 356 359    Lab 03/08/12 0640 03/07/12 1626 03/07/12 0852 03/06/12 0640 03/05/12 0550 03/03/12 1013  NA 141 136 138 -- -- --  K 3.3* 3.7 2.8* -- -- --  CL 105 101 102 -- -- --  CO2 25 23 24  -- -- --  BUN 4* 3* 3* -- -- --  CREATININE 0.86 0.75 0.59 -- -- --  CALCIUM 7.3* 7.0* 6.8* -- -- --  PROT -- -- -- 6.2 5.8* 6.5  BILITOT -- -- -- 1.0 1.0 1.4*  ALKPHOS -- -- -- 64 59 101  ALT -- -- -- 13 14 17   AST -- -- -- 27 31 55*  GLUCOSE 82 117* 106* -- -- --   HIV:  nonreactive, 7/18 Fecal occult blood:  Negative on 7/20  POSITIVE on 7/23  Imaging/Diagnostic Tests: CXR, 7/18 @1424  IMPRESSION:  Dense pneumonia in the left lung, probably the left upper lobe.  CT, 7/18 @2028  IMPRESSION:  Dense left upper lobe and superior segment left lower lobe  consolidation, most compatible with pneumonia. Collapse with  proximal endobronchial lesion is felt less likely.  Gallstones with possible choledocholithiasis or volume averaging  from pancreatic calcification. Consider MRCP for further  evaluation.  Hepatic steatosis.  Trace upper abdominal ascites.  MRCP, 7/19 @1243  IMPRESSION:  1. Moderate to severely degraded exam, secondary to motion and  inability to hold breath.  2. No biliary ductal dilatation. Distal common duct findings,  which when combined with CT findings, are most consistent with  small distal common duct stones. Concurrent cholelithiasis, as on CT.  3. Findings of chronic pancreatitis, without definite acute  superimposed process.  4. Marked hepatic steatosis.  5. Left base air space disease and adjacent fluid, as before.  6. Renal and perirenal edema with suggestion of decreased contrast  excretion on delayed images. Question acute or subacute renal  Insufficiency.  CXR, 7/21 IMPRESSION:  1. Significantly worsening aeration throughout the left lung,  compatible with progressive multilobar pneumonia.  2. New small bilateral pleural effusions.  3. Atherosclerosis.   Assessment/Plan: Nery Kalisz is a 51yo female with PMH significant for HTN, EtOH  abuse/alcoholic hepatitis, and breast CA (2001, s/p lumpectomy, chemo, and radiation therapy) presenting with inability to tolerate PO with shortness of breath. Admitted from clinic on 7/18.   1. Shortness of breath/Cough: CXR/CT findings PNA of LUL with cavitation. DDx also included PE, but less likely. Initially thought to be related to aspiration due to hx of alcoholic abuse, but  this is less clear, now. Subjective fevers at home, none here so far this admission. TB considered in DDx, PPD placed 7/20, negative on 7/22. Vancomycin was added as pt was not improving on Zosyn + azithromycin. PPD negative, but with nutrition status, perhaps pt is anergic. Pulmonology was consulted 7/25, given current clinical appearance that is only mildy improved, if at all, with evidence of cavitary lesions in lung as above. Pulmonolgy (Dr. Molli Knock) recommended tracheal aspirate with respiratory culture, which is pending. Lung exams continue to slowly improve. PLAN - Appreciate pulmonology recommendations. Will continue Zosyn (started 7/18) and Vancomycin per pharmacy (started 7/21). Pt will need PICC line for extended course of abx. Supplemental O2 as needed. Azithromycin started on 7/19 for 3 days to cover atypicals, completed.  2. Abdominal pain/anorexia: Patient with history of EtOH abuse and EtOH induced hepatitis, initially presented with abdominal pain, icterus and inability to tolerate PO. Broad differential including pancreatitis, hepatitis, malignancy, gallstones, gastritis. Admitted to telemetry and for hydration. CT shows LUL PNA, gallstones with possible choledocolithiasis and hepatic steatosis with trace upper abd ascites. MRCP on 7/19 is a generally poor study, shows current cholelithiasis and chronic pancreatitis and nonspecific distal common duct findings. Requiring replenishment of K and Mg during hospital stay. Pt with generally improving abdominal pain, but fecal occult blood positive on 7/23; Hb stable s/p 2 units pRBC transfused 7/24. PLAN - GI recommendations appreciated, will likely refer pt for outpt follow-up per Dr. Marzetta Board note, 7/24. Protonix daily. Cont. Zofran PRN for nausea. Will continue to replenish electrolytes as appropriate.  3. Anemia - Hb 11.0 in April 2013, 8.5 on admission this time. Trended down to 6.9 on 7/19. Now 7.6 on 7/20. Most likely mixed vitamin/iron  deficiencies secondary to EtOH abuse and general poor PO intake; MCV near upper limit of normal with one measurement 101.6, and iron panel on 7/21 shows low iron and TIBC but elevation of ferritin.Oral iron started 7/21, but discontinued 7/23; pt likely with iron metabolism derangement secondary to alcoholism and we do not want to iron overload her.  Stool initially were hemoccult negative, no bloody emesis. Stool hemoccult positive on 7/23. Hb 5.6, pt transfused 2 units on 7/24 with Hb rise to 9.5. Hb stable, 7/25 at 9.6, now 9.0 on 7/26. PLAN - Will continue to monitor CBC's. Continue multivitamin, folate, thiamine. Will likely refer pt for outpt GI follow-up, as above.  4. EtOH abuse: Last drink >7 days ago. Unlikely to withdraw at this point if she has truly not had anything to drink more recently. Pt with some elevated BP, temp, tachycardia, sporadic increasedO2 requirement throughout admission, remains today. Pt required 2 doses of Ativan per CIWA protocol, prior to starting scheduled Ativan on 7/22, and several additional PRN doses after. Not requiring CIWA score-based doses since overnight 7/23-7/24. PLAN - Continue scheduled Ativan. Continue CIWA protocol, last renewed 7/24. Multivitamin, folic acid, thiamine.  5. Tachycardia: Initially believed likely 2/2 to dehydration, although as above PE is possible but less likely; no further evidence of PE. Also possible EtOH withdrawal and/or component of anemia, as above. Pt currently taking better PO, tachycardia with some intermittent  improvement, though still in low 100's.  6. Depression: Has expressed passive SI in the past as well as in clinic on 7/18. No plan vocalized. Will continue home SSRI and monitor closely.   7. HTN: Slightly low BP on admission, anti-hypertensives were held, then intermittent elevated pressures. Home meds restarted as pt has clinically begun to improve slowly. BP still around 150/100. PLAN - Home HCTZ restarted on 7/21,  restarted lisinopril 7/25. May increase/adjust or add new agent.  8. PT/OT - Updated notes reflect recommendation for SNF placement.CSW actively involved and pt agreeable. Assistance and recommendations from all sides is appreciated!  FEN/GI: full liquid diet, advance as tolerated, NS @ 100 mL/hr. MVI with thiamine and folic acid  PPX: Lovenox d/ced 7/23 as FOB is positive, SCD's only for now; Protonix.  Dispo: Pending further work up, likely to SNF  Bobbye Morton, MD PGY-1, [facility] Family Medicine FPTS Intern pager: [phone]

## 2012-03-08 NOTE — Progress Notes (Signed)
Clinical Social Worker (CSW) informed this morning that pt received her pending Medicaid number today #[id number] M. CSW also received a bed offer from [facility] and confirmed that a bed will be available for pt when she is medically stable. CSW supervisor to provide a Letter of Guarantee for payment at the facility. CSW will inform pt and facilitate with dc when pt is stable.

## 2012-03-08 NOTE — Progress Notes (Signed)
Clinical Child psychotherapist  (CSW) informed that [name] will still be able to accept pt with IV meds pending cost. CSW awaiting a pasarr number for pt. CSW to follow up on Monday and facilitate with discharge.  Theresia Bough, MSW, Theresia [facility]ors [phone]

## 2012-03-08 NOTE — Progress Notes (Signed)
Patient has pulled second IV site out of her arm while trying to get out of bed;  Two nurses has attempted IV site placement and has been unsuccessful; will contact IV team and continue to monitor patient   D. Manson Passey RN

## 2012-03-08 NOTE — Progress Notes (Signed)
MD on call notified that pt is having frequent urination but only scant amount voided, also on assessment of lung crackles was auscultated and that pt has NS @100  ml/hr running. Ilean Skill LPN

## 2012-03-08 NOTE — Progress Notes (Signed)
I cosign for Karen Wall's assessment, med administration, notes, I/O, and care plan education. 

## 2012-03-08 NOTE — Progress Notes (Signed)
Patient continues to get out of bed and is very confused; patient has pulled IV line out and refuses to stay in bed.  Patient has fell and when staff entered the room, patient's boyfriend was in the room and stated that he thought she was just going to the bedside commode but instead she tried to leave the room; when I arrived to the room the patient was sitting on the side of the bed; MD notified and made aware, orders given for CT of the Head, patient's vitals signs are stable and there are no visible scars, bruises or bleeding on the patient; will continue to monitor patient     D. Manson Passey RN

## 2012-03-08 NOTE — Progress Notes (Signed)
03/08/12 1322 Tera Mater, RN, BSN NCM [phone] PT has recommended SNF for rehab at discharge.  Presently pt. is requiring IV antibiotics and will need a PICC line for long term therapy.  CSW has been consulted.

## 2012-03-08 NOTE — Progress Notes (Signed)
Cosigned for [employer] RN's assessment, I/O, care plan/education, medication administration   D. Manson Passey RN

## 2012-03-09 ENCOUNTER — Inpatient Hospital Stay (HOSPITAL_COMMUNITY): Payer: MEDICAID

## 2012-03-09 LAB — CBC
HCT: 25.3 % — ABNORMAL LOW (ref 36.0–46.0)
MCV: 95.5 fL (ref 78.0–100.0)
RBC: 2.65 MIL/uL — ABNORMAL LOW (ref 3.87–5.11)
WBC: 12.9 10*3/uL — ABNORMAL HIGH (ref 4.0–10.5)

## 2012-03-09 LAB — BASIC METABOLIC PANEL
BUN: 4 mg/dL — ABNORMAL LOW (ref 6–23)
CO2: 26 mEq/L (ref 19–32)
Chloride: 107 mEq/L (ref 96–112)
Creatinine, Ser: 0.93 mg/dL (ref 0.50–1.10)

## 2012-03-09 MED ORDER — ZOLPIDEM TARTRATE 5 MG PO TABS
5.0000 mg | ORAL_TABLET | Freq: Every evening | ORAL | Status: DC | PRN
  Administered 2012-03-09: 5 mg via ORAL
  Filled 2012-03-09: qty 1

## 2012-03-09 MED ORDER — SODIUM CHLORIDE 0.9 % IV SOLN
750.0000 mg | Freq: Two times a day (BID) | INTRAVENOUS | Status: DC
  Administered 2012-03-09: 750 mg via INTRAVENOUS
  Filled 2012-03-09 (×3): qty 750

## 2012-03-09 MED ORDER — HYDROCHLOROTHIAZIDE 25 MG PO TABS
25.0000 mg | ORAL_TABLET | Freq: Every day | ORAL | Status: DC
  Administered 2012-03-10 – 2012-03-12 (×3): 25 mg via ORAL
  Filled 2012-03-09 (×3): qty 1

## 2012-03-09 MED ORDER — LABETALOL HCL 5 MG/ML IV SOLN
10.0000 mg | INTRAVENOUS | Status: DC | PRN
  Administered 2012-03-09 – 2012-03-11 (×3): 10 mg via INTRAVENOUS
  Filled 2012-03-09 (×3): qty 4

## 2012-03-09 MED ORDER — LISINOPRIL 40 MG PO TABS
40.0000 mg | ORAL_TABLET | Freq: Every day | ORAL | Status: DC
  Administered 2012-03-09 – 2012-03-11 (×3): 40 mg via ORAL
  Filled 2012-03-09 (×4): qty 1

## 2012-03-09 NOTE — Progress Notes (Signed)
MD notified that pt has had 2 consecutive CIWA scores 10 or greater. Pt also hypertensive will continue to monitor. Ilean Skill LPN

## 2012-03-09 NOTE — Progress Notes (Signed)
FMTS Attending Daily Note: Denny Levy MD [phone] pager office [phone] I  have seen and examined this patient, reviewed their chart. I have discussed this patient with the resident. I agree with the resident's findings, assessment and care plan. Reviewed CT scan which again shows advanced atrophy for age. Tremor more pronounced today. Parkinsonian component? Considering options. Respiratory status much improved.

## 2012-03-09 NOTE — Progress Notes (Signed)
Family Medicine Teaching Service Daily Progress Note Intern Pager: [phone]  Patient name: NIURKA BENECKE Medical record number: [id number] Date of birth: 10-Dec-1960 Age: 51 y.o. Gender: female  Primary Care Provider: MATTHEWS,CODY, DO  Subjective: Pt seen at bedside. Sleeping, wakes to voice and touch.  Non-verbal this morning, but denies pain or difficulty breathing. Ativan 1mg  x4 overnight  Objective: Temp:  [97.7 F (36.5 C)-98 F (36.7 C)] 97.8 F (36.6 C) (07/27 0645) Pulse Rate:  [82-107] 88  (07/27 0740) Resp:  [19-20] 20  (07/26 2224) BP: (149-170)/(100-125) 149/100 mmHg (07/27 0740) SpO2:  [93 %-98 %] 93 % (07/27 0645) Weight:  [110 lb 7.2 oz (50.1 kg)] 110 lb 7.2 oz (50.1 kg) (07/27 0645)  Exam: General: thin, sleeping, difficult to arouse, received 1mg  IV Ativan 30 minutes prior to exam Cardiovascular: RRR, no murmurs Respiratory: decreased breath sounds remain, left worse than right; mild coarse crackles present on left Abdomen: soft, minimal tenderness mostly on left side Extremities: no edema  Laboratory:  Lab 03/09/12 0305 03/08/12 0640 03/07/12 0852  WBC 12.9* 12.8* 12.7*  HGB 8.7* 9.0* 9.6*  HCT 25.3* 26.0* 27.9*  PLT 320 323 356    Lab 03/09/12 0305 03/08/12 0640 03/07/12 1626 03/06/12 0640 03/05/12 0550 03/03/12 1013  NA 141 141 136 -- -- --  K 3.7 3.3* 3.7 -- -- --  CL 107 105 101 -- -- --  CO2 26 25 23  -- -- --  BUN 4* 4* 3* -- -- --  CREATININE 0.93 0.86 0.75 -- -- --  CALCIUM 8.0* 7.3* 7.0* -- -- --  PROT -- -- -- 6.2 5.8* 6.5  BILITOT -- -- -- 1.0 1.0 1.4*  ALKPHOS -- -- -- 64 59 101  ALT -- -- -- 13 14 17   AST -- -- -- 27 31 55*  GLUCOSE 116* 82 117* -- -- --   HIV: nonreactive Fecal occult blood:  Negative on 7/20  POSITIVE on 7/23  Tracheal aspirate: few yeast  CXR: Right arm PICC line with tip in right atrium. For a distal SVC  position PICC line should be retracted about 1 cm. Right lung is  clear. Again noted consolidation  air bronchogram and mild  cavitation left lung consistent with multifocal pneumonia.  Probable small bilateral pleural effusion.   Assessment/Plan:  Metha Kolasa is a 51yo female with PMH significant for HTN, EtOH abuse/alcoholic hepatitis, and breast CA (2001, s/p lumpectomy, chemo, and radiation therapy) presenting with inability to tolerate PO with shortness of breath, found to have LUL pneumonia.  # Shortness of breath/Cough: CXR/CT findings PNA of LUL with cavitation. DDx also included PE, but less likely. Initially thought to be related to aspiration due to hx of alcoholic abuse, but this is less clear, now.  TB considered; PPD negative. Vancomycin was added as pt was not improving on Zosyn + azithromycin. Pulmonology was consulted 7/25, given poor clinical improvement and evidence of cavitary lesions in lung as above.  S/p azithromycin x3 days. - appreciate pulmonary recommendations - f/u respiratory culture - continue Vanc and Zosyn - O2 as needed  # EtOH abuse: Last drink >7 days ago. Unlikely to withdraw at this point if she has truly not had anything to drink more recently. Pt with some elevated BP, temp, tachycardia, sporadic increasedO2 requirement throughout admission. Started on scheduled ativan 7/24.  Has been in hospital w/out alcohol for 9 days now.  S/p magnesium replacement. - continue scheduled ativan - continue to replete electrolytes as needed -  will d/c CIWA as symptoms unlikley related to alcohol withdrawal  # HTN: Slightly low BP on admission, anti-hypertensives were held, then intermittent elevated pressures. Home meds restarted as pt has clinically begun to improve slowly. Now elevated BPs. - continue HCTZ 12.5 daily - increase lisinopril to 40mg  daily  # Abdominal pain/anorexia: Patient with history of EtOH abuse and EtOH induced hepatitis, initially presented with abdominal pain, icterus and inability to tolerate PO. Broad differential including pancreatitis,  hepatitis, malignancy, gallstones, gastritis. CT shows gallstones with possible choledocolithiasis and hepatic steatosis with trace upper abd ascites. MRCP on 7/19 is a generally poor study, shows current cholelithiasis, chronic pancreatitis and nonspecific distal common duct findings. FOBT positive on 7/23. - appreciate GI recommendations - likely outpatient follow up - continue protonix - continue zofran  # Anemia - Hb 11.0 in April 2013, 8.5 on admission this time. Trended down to 6.9 on 7/19. Now 7.6 on 7/20. Most likely mixed vitamin/iron deficiencies secondary to EtOH abuse and general poor PO intake; MCV near upper limit of normal with one measurement 101.6, and iron panel on 7/21 shows low iron and TIBC but elevation of ferritin.Oral iron started 7/21, but discontinued 7/23; pt likely with iron metabolism derangement secondary to alcoholism and we do not want to iron overload her.  Stool initially were hemoccult negative, no bloody emesis. Stool hemoccult positive on 7/23. Hb 5.6, pt transfused 2 units on 7/24 with Hb rise to 9.5. Hb stable around 9. - continue to monitor CBCs  # Tachycardia: Initially believed likely 2/2 to dehydration, although as above PE is possible but less likely; no further evidence of PE. Also possible EtOH withdrawal and/or component of anemia, as above. Remains intermittently tachycardic despite adequate hydration.  No evidence for hyperthyroidism.  - continue to monitor  # Depression: Has expressed passive SI in the past as well as in clinic on 7/18. No plan vocalized. Will continue home SSRI and monitor closely.   # PT/OT - Updated notes reflect recommendation for SNF placement.CSW actively involved and pt agreeable. Assistance and recommendations from all sides is appreciated!  FEN/GI: full liquid diet, advance as tolerated, NS @ 75 mL/hr. MVI with thiamine and folic acid  PPX: Lovenox d/ced 7/23 as FOB is positive, SCD's only for now; Protonix.  Dispo: Pending  further work up, likely to SNF  Phebe Colla, MD PGY-2, [facility] Family Medicine FPTS Intern pager: [phone]

## 2012-03-09 NOTE — Progress Notes (Signed)
Notification from lab the critical vanc trough of 31.0 notified pharmacy of this report informed that vanco was infusing now but lab was drawn before. Pharmacy advised to continue infusing since bag was half done medication will be adjusted. Ilean Skill LPN

## 2012-03-09 NOTE — Progress Notes (Signed)
ANTIBIOTIC CONSULT NOTE - FOLLOW UP  Pharmacy Consult for Vancomycin/Zosyn Indication: pneumonia  No Known Allergies  Patient Measurements: Height: 5\' 7"  (170.2 cm) Weight: 110 lb 10.7 oz (50.2 kg) (bed scale pt shaking to bad to stand on scale) IBW/kg (Calculated) : 61.6   Vital Signs: Temp: 98 F (36.7 C) (07/26 2224) Temp src: Oral (07/26 2224) BP: 169/125 mmHg (07/27 0100) Pulse Rate: 100  (07/27 0100) Intake/Output from previous day: 07/26 0701 - 07/27 0700 In: 1401.7 [P.O.:60; I.V.:1041.7; IV Piggyback:300] Out: 475 [Urine:475] Intake/Output from this shift: Total I/O In: -  Out: 100 [Urine:100]  Labs:  [facility] 03/09/12 0305 03/08/12 0640 03/07/12 1626 03/07/12 0852  WBC 12.9* 12.8* -- 12.7*  HGB 8.7* 9.0* -- 9.6*  PLT 320 323 -- 356  LABCREA -- -- -- --  CREATININE 0.93 0.86 0.75 --   Estimated Creatinine Clearance: 58 ml/min (by C-G formula based on Cr of 0.93).  Basename 03/09/12 0315 03/06/12 1343  VANCOTROUGH 31.0* 10.3  VANCOPEAK -- --  Drue Dun -- --  GENTTROUGH -- --  GENTPEAK -- --  GENTRANDOM -- --  TOBRATROUGH -- --  TOBRAPEAK -- --  TOBRARND -- --  AMIKACINPEAK -- --  AMIKACINTROU -- --  AMIKACIN -- --     Microbiology: Recent Results (from the past 720 hour(s))  CULTURE, RESPIRATORY     Status: Normal (Preliminary result)   Collection Time   03/07/12  4:02 PM      Component Value Range Status Comment   Specimen Description TRACHEAL ASPIRATE   Final    Special Requests NONE   Final    Gram Stain     Final    Value: MODERATE WBC PRESENT,BOTH PMN AND MONONUCLEAR     MODERATE SQUAMOUS EPITHELIAL CELLS PRESENT     FEW YEAST   Culture PENDING   Incomplete    Report Status PENDING   Incomplete     Anti-infectives     Start     Dose/Rate Route Frequency Ordered Stop   03/06/12 1530   vancomycin (VANCOCIN) IVPB 1000 mg/200 mL premix        1,000 mg 200 mL/hr over 60 Minutes Intravenous Every 12 hours 03/06/12 1500     03/03/12  1430   vancomycin (VANCOCIN) 500 mg in sodium chloride 0.9 % 100 mL IVPB  Status:  Discontinued        500 mg 100 mL/hr over 60 Minutes Intravenous Every 12 hours 03/03/12 1420 03/06/12 1500   03/01/12 1200   azithromycin (ZITHROMAX) tablet 500 mg        500 mg Oral Daily 03/01/12 1057 03/03/12 1136   02/29/12 1700   piperacillin-tazobactam (ZOSYN) IVPB 3.375 g        3.375 g 12.5 mL/hr over 240 Minutes Intravenous Every 8 hours 02/29/12 1637           Assessment: 51 yo female  is on day 7 of vancomycin and day 10 Zosyn for pneumonia. Vancomycin level (31 mcg/mL) is above-goal. Confirmed with RN that level was drawn before vancomycin dose started.   Goal of Therapy:  Vancomycin trough level 15-20 mcg/ml  Plan:  1. Decrease IV vancomycin to 750mg  IV Q12H.  2. Continue Zosyn at 3.375gm IV Q8H (4 hr infusion)  Lorre Munroe, PharmD 03/09/2012 4:15 AM

## 2012-03-10 ENCOUNTER — Inpatient Hospital Stay (HOSPITAL_COMMUNITY): Payer: MEDICAID

## 2012-03-10 DIAGNOSIS — R4182 Altered mental status, unspecified: Secondary | ICD-10-CM

## 2012-03-10 DIAGNOSIS — F1027 Alcohol dependence with alcohol-induced persisting dementia: Secondary | ICD-10-CM

## 2012-03-10 LAB — BASIC METABOLIC PANEL
BUN: 5 mg/dL — ABNORMAL LOW (ref 6–23)
Chloride: 106 mEq/L (ref 96–112)
Creatinine, Ser: 1.15 mg/dL — ABNORMAL HIGH (ref 0.50–1.10)
GFR calc Af Amer: 64 mL/min — ABNORMAL LOW (ref >90)
Glucose, Bld: 82 mg/dL (ref 70–99)

## 2012-03-10 LAB — CBC
HCT: 24.8 % — ABNORMAL LOW (ref 36.0–46.0)
Hemoglobin: 8.5 g/dL — ABNORMAL LOW (ref 12.0–15.0)
MCHC: 34.3 g/dL (ref 30.0–36.0)
MCV: 96.9 fL (ref 78.0–100.0)
RDW: 16.9 % — ABNORMAL HIGH (ref 11.5–15.5)
WBC: 13.7 10*3/uL — ABNORMAL HIGH (ref 4.0–10.5)

## 2012-03-10 LAB — AMMONIA: Ammonia: 35 umol/L (ref 11–60)

## 2012-03-10 LAB — CULTURE, RESPIRATORY W GRAM STAIN

## 2012-03-10 MED ORDER — VANCOMYCIN HCL 500 MG IV SOLR
500.0000 mg | Freq: Two times a day (BID) | INTRAVENOUS | Status: DC
  Administered 2012-03-10 – 2012-03-11 (×2): 500 mg via INTRAVENOUS
  Filled 2012-03-10 (×4): qty 500

## 2012-03-10 MED ORDER — LORAZEPAM 0.5 MG PO TABS
0.5000 mg | ORAL_TABLET | Freq: Two times a day (BID) | ORAL | Status: DC
  Administered 2012-03-10 – 2012-03-11 (×3): 0.5 mg via ORAL
  Filled 2012-03-10 (×3): qty 1

## 2012-03-10 MED ORDER — LORAZEPAM 0.5 MG PO TABS
0.5000 mg | ORAL_TABLET | Freq: Once | ORAL | Status: DC
  Filled 2012-03-10: qty 1

## 2012-03-10 MED ORDER — LOPERAMIDE HCL 2 MG PO CAPS
4.0000 mg | ORAL_CAPSULE | Freq: Once | ORAL | Status: AC
  Administered 2012-03-10: 4 mg via ORAL
  Filled 2012-03-10: qty 2

## 2012-03-10 NOTE — Progress Notes (Signed)
Family Medicine Teaching Service Daily Progress Note Intern Pager: [phone]  Patient name: [name] Medical record number: [id number] Date of birth: Nov 01, 1960 Age: 51 y.o. Gender: female  Primary Care Provider: MATTHEWS,CODY, DO  Subjective: Pt seen at bedside. Initially sleeping, but wakes to voice/touch. Somewhat less responsive than on previous days, worse than pt was on 7/26, particularly (my last interaction); pt was nonverbal yesterday, but today responds appropriately to questions, though vocal responses delayed. Pt does close eyes and become less responsive during conversation, repeatedly (?going back to sleep), though rouses to further questioning. Vocalizes no needs at time of exam.  Objective: Temp:  [97.3 F (36.3 C)-98.4 F (36.9 C)] 97.3 F (36.3 C) (07/28 0416) Pulse Rate:  [101-107] 107  (07/28 0416) Resp:  [18] 18  (07/28 0416) BP: (145-170)/(101-112) 170/112 mmHg (07/28 0416) SpO2:  [92 %-98 %] 98 % (07/28 0416) Weight:  [106 lb 4.2 oz (48.2 kg)] 106 lb 4.2 oz (48.2 kg) (07/28 0416) Exam: General: Pt with decreased responsiveness and slowed verbal responses as described above, lying in bed, initially asleep but rouses to questions and commands Cardiovascular: increased rate, no murmur appreciated; unchanged from previous days Respiratory: difficult to evaluate this morning; decreased breath sounds bilaterally but pt with generally poor effort when instructed to breath deeply; no frank wheezes, some coarse crackles on left Abdomen: soft, minimal tenderness on left side Extremities: coarse tremor while awake, not present while sleeping and much reduced when not moving; otherwise moves all extremities equally, distal pulses 2+, intact/symmetric  Laboratory:  Lab 03/10/12 0540 03/09/12 0305 03/08/12 0640  WBC 13.7* 12.9* 12.8*  HGB 8.5* 8.7* 9.0*  HCT 24.8* 25.3* 26.0*  PLT 308 320 323    Lab 03/10/12 0540 03/09/12 0305 03/08/12 0640 03/06/12 0640 03/05/12  0550  NA 143 141 141 -- --  K 4.0 3.7 3.3* -- --  CL 106 107 105 -- --  CO2 26 26 25  -- --  BUN 5* 4* 4* -- --  CREATININE 1.15* 0.93 0.86 -- --  CALCIUM 8.3* 8.0* 7.3* -- --  PROT -- -- -- 6.2 5.8*  BILITOT -- -- -- 1.0 1.0  ALKPHOS -- -- -- 64 59  ALT -- -- -- 13 14  AST -- -- -- 27 31  GLUCOSE 82 116* 82 -- --   HIV: nonreactive, 7/18 Fecal occult blood:  Negative on 7/20  POSITIVE on 7/23  Imaging/Diagnostic Tests: CXR, 7/18 @1424  IMPRESSION:  Dense pneumonia in the left lung, probably the left upper lobe.  CT, 7/18 @2028  IMPRESSION:  Dense left upper lobe and superior segment left lower lobe  consolidation, most compatible with pneumonia. Collapse with  proximal endobronchial lesion is felt less likely.  Gallstones with possible choledocholithiasis or volume averaging  from pancreatic calcification. Consider MRCP for further  evaluation.  Hepatic steatosis.  Trace upper abdominal ascites.  MRCP, 7/19 @1243  IMPRESSION:  1. Moderate to severely degraded exam, secondary to motion and  inability to hold breath.  2. No biliary ductal dilatation. Distal common duct findings,  which when combined with CT findings, are most consistent with  small distal common duct stones. Concurrent cholelithiasis, as on CT.  3. Findings of chronic pancreatitis, without definite acute  superimposed process.  4. Marked hepatic steatosis.  5. Left base air space disease and adjacent fluid, as before.  6. Renal and perirenal edema with suggestion of decreased contrast  excretion on delayed images. Question acute or subacute renal  Insufficiency.  CXR, 7/21 IMPRESSION:  1. Significantly worsening aeration throughout the left lung,  compatible with progressive multilobar pneumonia.  2. New small bilateral pleural effusions.  3. Atherosclerosis.   Assessment/Plan: [name] is a 51yo female with PMH significant for HTN, EtOH abuse/alcoholic hepatitis, and breast CA (2001, s/p  lumpectomy, chemo, and radiation therapy) presenting with inability to tolerate PO with shortness of breath. Admitted from clinic on 7/18.   ## Mental status/tremor - Waxing and waning confusion/reduced responsiveness, coarse tremor on exam especially with movement, and global atrophy on CT 7/26 s/p fall. Also with new complaint of diarrhea, per nursing. Given history of heavy alcohol use and little apparent clinical improvement of pt's pneumonia in addition to the above symptoms, neurology has been consulted, 7/28 (Dr. Lu Duffel).  PLAN - We will check an EKG, lactic acid, Legionella, blood cultures, CXR. Will check ammonia level, as pt was given Imodium for diarrhea. Have considered MRI, but we will await any further recommendations, which are appreciated.  1. Shortness of breath/Cough: CXR/CT findings PNA of LUL with cavitation. DDx also included PE, but less likely. Initially thought to be related to aspiration due to hx of alcoholic abuse, but this is less clear, now. Subjective fevers at home, none here so far this admission. TB considered in DDx, PPD placed 7/20, negative on 7/22. Vancomycin added as pt was not improving on Zosyn + azithromycin. Pulmonology (Dr. Molli Knock) was consulted 7/25 for evidence of cavitary PNA with little clinical improvement. Respiratory culture shows only few yeast, compatible with Candida, ?contaminant. Lung exams stable the last 2-3 days, improved from admission but still with coarse sounds. WBC drifting upwards again. PLAN -  Continue Zosyn (started 7/18) and Vancomycin per pharmacy (started 7/21). Supplemental O2 as needed. Will follow respiratory culture. Azithromycin started on 7/19 for 3 days to cover atypicals, completed. Appreciate pulmonology recommendations/assistance.  2. Anemia - Hb 11.0 in April 2013, 8.5 on admission this time. Trended down to 6.9 on 7/19. Now 7.6 on 7/20. Most likely mixed vitamin/iron deficiencies secondary to EtOH abuse and general poor PO  intake; MCV near upper limit of normal with one measurement 101.6, and iron panel on 7/21 shows low iron and TIBC but elevation of ferritin.Oral iron started 7/21, but discontinued 7/23; pt likely with iron metabolism derangement secondary to alcoholism and we do not want to iron overload her.  Stool initially were hemoccult negative, no bloody emesis. Stool hemoccult positive on 7/23. Hb 5.6, pt transfused 2 units on 7/24 with Hb rise to 9.5. Hb with slight slow drift down again, 7/26 9.0 --> 7/27 8.7 --> 7/28 8.5. PLAN - Will continue to monitor CBC's; may need to re-consult GI. Continue multivitamin, folate, thiamine. May refer pt for outpt GI follow-up, per Dr. Marzetta Board note on 7/24.  3. Abdominal pain/anorexia: Patient with history of EtOH abuse and EtOH induced hepatitis, initially presented with abdominal pain, icterus and inability to tolerate PO. Broad differential including pancreatitis, hepatitis, malignancy, gallstones, gastritis. Admitted to telemetry and for hydration. CT shows LUL PNA, gallstones with possible choledocolithiasis and hepatic steatosis with trace upper abd ascites. MRCP on 7/19 is a generally poor study, shows current cholelithiasis and chronic pancreatitis and nonspecific distal common duct findings. Abdominal pain appears resolved; some diarrhea 7/27-7/28. C.diff negative. PLAN - Immodium ordered x1, 7/28. Monitor for complaints. Protonix daily. Cont. Zofran PRN for nausea. GI outpt follow-up vs re-consult, as above.  4. HTN: Slightly low BP on admission, anti-hypertensives were held, then intermittent elevated pressures. Home HCTZ restarted on 7/21, restarted  lisinopril 7/25; lisinopril increased to 40 mg daily on 7/27.  BP intermittently increased to 170's/100's, 7/28. PLAN - Continue HCTZ, lisinopril. Labetalol PRN, maintain pressures <180/110.  5. EtOH abuse: Last drink reported >3 days prior to admission, deemed unlikely to withdraw if she has truly not had anything to  drink more recently. CIWA protocol initiated due to clinical appearance, requiring intermittent dosing 7/19-7/27. Pt was started on scheduled Ativan on 7/22. CIWA last renewed 7/24; pt received 4 doses on 7/26-7/27 overnight, probably inappropriately as pt is well outside the window for EtOH withdrawal and has had tremor, tachycardia, and elevated BP since admission; CIWA discontinued 7/27. PLAN - Continue scheduled Ativan. Multivitamin, folic acid, thiamine.  6. Tachycardia: Initially believed likely secondary to dehydration given hx of inability to tolerate PO, no evidence of PE since admission. Also considered possible EtOH withdrawal and/or component of anemia, as above. Continues/persistent, though intermittent pulse rate in normal range around 80-90's. Monitor with other signs/symptoms.  7. Depression: Has expressed passive SI in the past as well as in clinic on 7/18. No plan vocalized. Will continue home SSRI and monitor closely.   8. PT/OT - Updated notes reflect recommendation for SNF placement.CSW actively involved and pt agreeable. Assistance and recommendations from all sides is appreciated!  FEN/GI:   full liquid diet, advance as tolerated  NS @ 75 mL/hr. MVI with thiamine and folic acid.  Multiple K and Mg replenishments throughout stay; will continue as appropriate. PPX: Lovenox d/ced 7/23 as FOB is positive, SCD's only for now; Protonix.  Dispo: Pending further work up, likely to SNF  Bobbye Morton, MD PGY-1, [facility] Family Medicine FPTS Intern pager: [phone]

## 2012-03-10 NOTE — Consult Note (Signed)
Referring Physician:Hensel    Chief Complaint: AMS, abnormal movement  HPI: [name] is an 51 y.o. female who has been hospitalized since 02/29/12, initially for LUL cavitary PNA (neg TB), mixed anemia due to [facility] and FOB+, and abdominal pain, now resolved.  Was on CIWA protocol 7/19-7/27/13; received 4 doses Ativan 7/26-7/27.  Last reported ETOH consumption 02/26/12. Today was noted by resident to be "somewhat less responsive than on previous days, worse than pt was on 7/26, particularly; pt was nonverbal yesterday, but today responds appropriately to questions, though vocal responses delayed. Pt does close eyes and become less responsive during conversation, repeatedly (?going back to sleep), though rouses to further questioning."  Neurology consult requested for evaluation of mental status changes.  Past Medical History  Diagnosis Date  . Hypertension   . Alcoholic hepatitis   . Alcoholism   . Breast cancer 2001    Rt side. Had lumpectomy, chemo and XRT by [facility]  . Shortness of breath     Past Surgical History  Procedure Date  . Rt lumpectomy 2001    History reviewed. No pertinent family history. Social History:  Quit smoking cigarettes about 11 years ago. Never used smokeless tobacco. Drinks about 25.2 ounces of alcohol per week. She does not use illicit drugs.  Allergies: No Known Allergies  Medications: Scheduled:   . feeding supplement  237 mL Oral BID BM  . folic acid  1 mg Oral Daily  . hydrochlorothiazide  25 mg Oral Daily  . lisinopril  40 mg Oral Daily  . loperamide  4 mg Oral Once  . LORazepam  0.5 mg Oral TID  . LORazepam  0.5 mg Oral Once  . multivitamin with minerals  1 tablet Oral Daily  . pantoprazole  40 mg Oral Q1200  . piperacillin-tazobactam (ZOSYN)  IV  3.375 g Intravenous Q8H  . potassium chloride  60 mEq Oral BID  . sodium chloride  3 mL Intravenous Q12H  . thiamine  100 mg Oral Daily   Or  . thiamine  100 mg Intravenous Daily  . tuberculin  5  Units Intradermal Once  . vancomycin  500 mg Intravenous Q12H  . venlafaxine XR  150 mg Oral Daily  . DISCONTD: hydrochlorothiazide  12.5 mg Oral Daily  . DISCONTD: magnesium oxide  400 mg Oral BID  . DISCONTD: vancomycin  750 mg Intravenous Q12H    ROS: No headache, change in vision, confusion or dizziness.  Chronic pain, especially of legs and back.  No abd pain, n/v presently.  ROS limited as pt is poor historian and displays limited cooperation.  Physical Examination: Blood pressure 163/108, pulse 107, temperature 97.3 F (36.3 C), temperature source Oral, resp. rate 18, height 5\' 7"  (1.702 m), weight 48.2 kg (106 lb 4.2 oz), SpO2 98.00%. Telemetry:  Neurologic Examination: Mental Status: Alert, oriented to self and place,questionable insight to reason for hospitalization, poor eye contact, frequently indicates she is done participating with examination.  Maintains alertness and fairly good attention during exam.  Not entirely cooperative.   Speech fluent without evidence of aphasia. Able to follow 3 step commands without difficulty. Cranial Nerves: II- Visual fields grossly intact. III/IV/VI-Extraocular movements intact.  Pupils reactive bilaterally.  No obvious nystagmus or ocular paresis V/VII-Smile symmetric.  At rest, has slight lower right facial flattening. VIII-grossly intact IX/X-not assessed. XI-bilateral shoulder shrug XII-midline tongue extension. Motor: 5/5 bilaterally, generalized muscle atrophy without increased tone.  Tremor increased with intention. No asterixis. Sensory:  light touch intact  throughout, bilaterally Deep Tendon Reflexes: 2+ and symmetric throughout Plantars: Downgoing bilaterally Cerebellar: dysmetria on finger to nose, heel to shin  bilaterally, R>L.  Gait not assessed.  Physical exam: Cachectic, frail, dry flaky skin, glossy tongue, no scleral icteris. Abdomen flat and soft. Extremities without edema.  Basic Metabolic Panel:  Lab 03/10/12  0540 03/09/12 0305 03/07/12 1626 03/05/12 0550  NA 143 141 -- --  K 4.0 3.7 -- --  CL 106 107 -- --  CO2 26 26 -- --  GLUCOSE 82 116* -- --  BUN 5* 4* -- --  CREATININE 1.15* 0.93 -- --  CALCIUM 8.3* 8.0* -- --  MG -- -- 2.3 0.6*  PHOS -- -- -- --   Liver Function Tests:  Lab 03/06/12 0640 03/05/12 0550  AST 27 31  ALT 13 14  ALKPHOS 64 59  BILITOT 1.0 1.0  PROT 6.2 5.8*  ALBUMIN 1.3* 1.2*   CBC:  Lab 03/10/12 0540 03/09/12 0305  WBC 13.7* 12.9*  NEUTROABS -- --  HGB 8.5* 8.7*  HCT 24.8* 25.3*  MCV 96.9 95.5  PLT 308 320  CBG:  Lab 03/05/12 1750 03/05/12 1131  GLUCAP 82 70   Thyroid Function Tests:  Lab 03/05/12 1320  TSH 4.615*  T4TOTAL --  FREET4 --  T3FREE --  THYROIDAB --   Urine Drug Screen: Drugs of Abuse     Component Value Date/Time   LABOPIA NONE DETECTED 11/25/2011 2101   COCAINSCRNUR NONE DETECTED 11/25/2011 2101   LABBENZ NONE DETECTED 11/25/2011 2101   AMPHETMU NONE DETECTED 11/25/2011 2101   THCU NONE DETECTED 11/25/2011 2101   LABBARB NONE DETECTED 11/25/2011 2101    03/08/2012  CT HEAD WITHOUT CONTRAST   Findings: There is a small low density fluid level in the visible left maxillary sinus, probably inflammatory.  Other Visualized paranasal sinuses and mastoids are clear.  No acute orbit or scalp soft tissue findings identified.  No scalp hematoma identified. No acute osseous abnormality identified.  Chronic dystrophic calcifications in the lentiform nuclei. Significantly age advanced cerebral and cerebellar volume loss again noted.  No ventriculomegaly. No midline shift, mass effect, or evidence of mass lesion.  No acute intracranial hemorrhage identified.  No evidence of cortically based acute infarction identified.  No suspicious intracranial vascular hyperdensity.  IMPRESSION: 1. No acute intracranial abnormality.  No acute traumatic injury identified. 2.  Age advanced global atrophy. H.LEE HALL III, M.D.   03/09/2012  PORTABLE CHEST - 1 VIEW   Findings: Cardiomediastinal silhouette is stable.  Right arm PICC line with tip in  right atrium. For a distal SVC position PICC line should be retracted about 1 cm.  Right lung is clear.  Again noted consolidation air bronchogram and mild cavitation left lung consistent with multifocal pneumonia.  Probable small bilateral pleural effusion.  IMPRESSION:  Right arm PICC line with tip in  right atrium. For a distal SVC position PICC line should be retracted about 1 cm.  Right lung is clear.  Again noted consolidation air bronchogram and mild cavitation left lung consistent with multifocal pneumonia. Probable small bilateral pleural effusion.   Lang Snow POP, M.D.   Assessment: 51 y.o. aaf female with multiple medical problems complicated by alcoholism.  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  Head Ct reveals advanced global brain atrophy. Plan: 1. Wean sedating scheduled medications including ativan and Ambien.  2. Use ativan for agitation prn. 3. Continue multivitamin/supplements for chronic ETOH abuse. 4. Encourage abstainance from ETOH.  Patient has been seen and examined by Dr. Roseanne Reno. No further neurologic intervention is recommended at this time.  If further questions arise, please call or page at that time.  Thank you for allowing neurology to participate in the care of this patient.  Marya Fossa PA-C [facility] [phone] 03/10/2012, 12:38 PM

## 2012-03-10 NOTE — Progress Notes (Signed)
FMTS Attending Daily Note: Jenny Levy MD [phone] pager office [phone] I  have seen and examined this patient, reviewed their chart. I have discussed this patient with the resident. I agree with the resident's findings, assessment and care plan. Her mental status has waxed and waned since admission. Iniially I thought this was related to her pneumonia. As that seems to be improving, I am at a loss to explain her decreased interaction today. I am also still puzzled by her intermittent, but at times impressive, tremor. I think we need neurology consult and we have ordered. Also will get blood cultures, repeat CXR etc in case this is beginning of medical worsening of her pneumonia.

## 2012-03-11 LAB — DIFFERENTIAL
Eosinophils Relative: 1 % (ref 0–5)
Lymphs Abs: 1.2 10*3/uL (ref 0.7–4.0)
Monocytes Relative: 5 % (ref 3–12)
Neutro Abs: 12.4 10*3/uL — ABNORMAL HIGH (ref 1.7–7.7)

## 2012-03-11 LAB — COMPREHENSIVE METABOLIC PANEL
ALT: 8 U/L (ref 0–35)
BUN: 5 mg/dL — ABNORMAL LOW (ref 6–23)
CO2: 27 mEq/L (ref 19–32)
Calcium: 8.3 mg/dL — ABNORMAL LOW (ref 8.4–10.5)
Creatinine, Ser: 1.31 mg/dL — ABNORMAL HIGH (ref 0.50–1.10)
GFR calc Af Amer: 54 mL/min — ABNORMAL LOW (ref >90)
GFR calc non Af Amer: 47 mL/min — ABNORMAL LOW (ref >90)
Glucose, Bld: 87 mg/dL (ref 70–99)
Total Protein: 7.2 g/dL (ref 6.0–8.3)

## 2012-03-11 LAB — CBC
HCT: 25.4 % — ABNORMAL LOW (ref 36.0–46.0)
Hemoglobin: 8.6 g/dL — ABNORMAL LOW (ref 12.0–15.0)
MCH: 33 pg (ref 26.0–34.0)
MCHC: 33.9 g/dL (ref 30.0–36.0)
MCV: 97.3 fL (ref 78.0–100.0)
RBC: 2.61 MIL/uL — ABNORMAL LOW (ref 3.87–5.11)

## 2012-03-11 LAB — URINALYSIS, ROUTINE W REFLEX MICROSCOPIC
Glucose, UA: NEGATIVE mg/dL
Hgb urine dipstick: NEGATIVE
Leukocytes, UA: NEGATIVE
Protein, ur: NEGATIVE mg/dL
Specific Gravity, Urine: 1.011 (ref 1.005–1.030)
Urobilinogen, UA: 0.2 mg/dL (ref 0.0–1.0)

## 2012-03-11 LAB — LEGIONELLA ANTIGEN, URINE: Legionella Antigen, Urine: NEGATIVE

## 2012-03-11 MED ORDER — AMLODIPINE BESYLATE 10 MG PO TABS
10.0000 mg | ORAL_TABLET | Freq: Every day | ORAL | Status: DC
  Administered 2012-03-12 – 2012-03-17 (×6): 10 mg via ORAL
  Filled 2012-03-11 (×6): qty 1

## 2012-03-11 MED ORDER — SULFAMETHOXAZOLE-TMP DS 800-160 MG PO TABS: 2.0000 | ORAL_TABLET | Freq: Two times a day (BID) | ORAL | Status: DC

## 2012-03-11 MED ORDER — MAGNESIUM CHLORIDE 64 MG PO TBEC
1.0000 | DELAYED_RELEASE_TABLET | Freq: Every day | ORAL | Status: DC
  Administered 2012-03-11: 64 mg via ORAL
  Filled 2012-03-11 (×2): qty 1

## 2012-03-11 MED ORDER — SULFAMETHOXAZOLE-TMP DS 800-160 MG PO TABS
2.0000 | ORAL_TABLET | Freq: Two times a day (BID) | ORAL | Status: DC
  Filled 2012-03-11 (×2): qty 2

## 2012-03-11 MED ORDER — SULFAMETHOXAZOLE-TMP DS 800-160 MG PO TABS
1.0000 | ORAL_TABLET | Freq: Two times a day (BID) | ORAL | Status: DC
  Administered 2012-03-11 – 2012-03-12 (×3): 1 via ORAL
  Filled 2012-03-11 (×4): qty 1

## 2012-03-11 NOTE — Progress Notes (Signed)
ANTIBIOTIC CONSULT NOTE - FOLLOW UP  Pharmacy Consult for Vancomycin/Zosyn Indication: pneumonia  No Known Allergies  Patient Measurements: Height: 5\' 7"  (170.2 cm) Weight: 97 lb 10.6 oz (44.3 kg) IBW/kg (Calculated) : 61.6   Vital Signs: Temp: 97.8 F (36.6 C) (07/29 0446) Temp src: Oral (07/29 0446) BP: 154/98 mmHg (07/29 0522) Pulse Rate: 102  (07/29 0446) Intake/Output from previous day: 07/28 0701 - 07/29 0700 In: 2133.3 [P.O.:660; I.V.:1073.3; IV Piggyback:400] Out: 250 [Urine:250] Intake/Output from this shift: Total I/O In: 220 [P.O.:220] Out: -   Labs:  Basename 03/11/12 0630 03/10/12 0540 03/09/12 0305  WBC 14.3* 13.7* 12.9*  HGB 8.6* 8.5* 8.7*  PLT 327 308 320  LABCREA -- -- --  CREATININE 1.31* 1.15* 0.93   Estimated Creatinine Clearance: 36.3 ml/min (by C-G formula based on Cr of 1.31).  Basename 03/09/12 0315  VANCOTROUGH 31.0*  VANCOPEAK --  Drue Dun --  GENTTROUGH --  GENTPEAK --  GENTRANDOM --  TOBRATROUGH --  TOBRAPEAK --  TOBRARND --  AMIKACINPEAK --  AMIKACINTROU --  AMIKACIN --     Microbiology: Recent Results (from the past 720 hour(s))  CULTURE, RESPIRATORY     Status: Normal   Collection Time   03/07/12  4:02 PM      Component Value Range Status Comment   Specimen Description TRACHEAL ASPIRATE   Final    Special Requests NONE   Final    Gram Stain     Final    Value: MODERATE WBC PRESENT,BOTH PMN AND MONONUCLEAR     MODERATE SQUAMOUS EPITHELIAL CELLS PRESENT     FEW YEAST   Culture FEW YEAST CONSISTENT WITH CANDIDA SPECIES   Final    Report Status 03/10/2012 FINAL   Final   CLOSTRIDIUM DIFFICILE BY PCR     Status: Normal   Collection Time   03/10/12  4:19 AM      Component Value Range Status Comment   C difficile by pcr NEGATIVE  NEGATIVE Final     Anti-infectives     Start     Dose/Rate Route Frequency Ordered Stop   03/10/12 1200   vancomycin (VANCOCIN) 500 mg in sodium chloride 0.9 % 100 mL IVPB        500  mg 100 mL/hr over 60 Minutes Intravenous Every 12 hours 03/10/12 1130     03/09/12 2300   vancomycin (VANCOCIN) 750 mg in sodium chloride 0.9 % 150 mL IVPB  Status:  Discontinued        750 mg 150 mL/hr over 60 Minutes Intravenous Every 12 hours 03/09/12 0424 03/10/12 1130   03/06/12 1530   vancomycin (VANCOCIN) IVPB 1000 mg/200 mL premix  Status:  Discontinued        1,000 mg 200 mL/hr over 60 Minutes Intravenous Every 12 hours 03/06/12 1500 03/09/12 0424   03/03/12 1430   vancomycin (VANCOCIN) 500 mg in sodium chloride 0.9 % 100 mL IVPB  Status:  Discontinued        500 mg 100 mL/hr over 60 Minutes Intravenous Every 12 hours 03/03/12 1420 03/06/12 1500   03/01/12 1200   azithromycin (ZITHROMAX) tablet 500 mg        500 mg Oral Daily 03/01/12 1057 03/03/12 1136   02/29/12 1700   piperacillin-tazobactam (ZOSYN) IVPB 3.375 g        3.375 g 12.5 mL/hr over 240 Minutes Intravenous Every 8 hours 02/29/12 1637           Assessment: 51 yo female  on vancomycin  And zosyn for pneumonia. Vancomycin level (31 mcg/mL) was above-goal on 7/27 and dose adjusted to 750 mg IV q12 hours.  Dose adjusted to 500 mg q12 hours yesterday for rising SrCr.  SrCr increased again today.   Goal of Therapy:  Vancomycin trough level 15-20 mcg/ml  Plan:  1. Check vanc trough tonight. 2. Continue Zosyn at 3.375gm IV Q8H (4 hr infusion)  Talbert Cage, PharmD 03/11/2012 9:33 AM

## 2012-03-11 NOTE — Progress Notes (Signed)
FMTS Attending Daily Note:  Renold Don MD  [phone] pager  Family Practice pager:  [phone] I have seen and examined this patient and have reviewed their chart. I have discussed this patient with the resident. I agree with the resident's findings, assessment and care plan.  Complex patient:  Initial review of records reveals minimally resolving PNA, persistent leukocytosis with absolute neutrophilia, persistent anemia, persistent hypokalemia and hypomagnesemia despite ACE-inhibitor therapy and repletion, persistently hypertensive despite dual anti-hypertensive treatment, and now with acute kidney injury.   On my exam today, she was awake and alert, appropriate, conversant, although she does mumble and is at times hard to hear.  Thin, cachetic feeling who endorses hunger and desires more food even after eating all of her lunch. Heart:  I do hear low systolic murmur loudest at Right sternal border.  RRR Lungs:  Poor inspiratory effort.  Left lung with scattered coarse crackles throughout. Plan:  I need to review her chart more completely.  I agree with continuing antibiotics for broad coverage.  I note rising creatinine -- she is on Vancomycin for MRSA coverage and was also started on ACE-inhibitor last week with rapid increasing of dosage.  Rising creatinine could be secondary to either or both of these.  - Most likely aspiration pneumonia in long-standing alcoholic.  However histoplasmosis or other fungal infection also possibility.  Follow creatinine and WBC trends.

## 2012-03-11 NOTE — Progress Notes (Signed)
Family Medicine Teaching Service Daily Progress Note Intern Pager: [phone]  Patient name: [name] Medical record number: [id number] Date of birth: 06/12/1961 Age: 51 y.o. Gender: female  Primary Care Provider: MATTHEWS,CODY, DO  Subjective: Pt seen at bedside. Initially sleeping, but appropriately responsive and oriented after waking. No current complaints this morning. Appetite still poor, pt states she's not hungry this morning. No other complaints. Breathing is better. No specific pain this morning.  Objective: Temp:  [97.8 F (36.6 C)-98.8 F (37.1 C)] 97.8 F (36.6 C) (07/29 0446) Pulse Rate:  [100-109] 100  (07/29 1057) Resp:  [20-22] 20  (07/29 1057) BP: (138-178)/(87-121) 138/87 mmHg (07/29 1057) SpO2:  [98 %-100 %] 100 % (07/29 0446) Weight:  [97 lb 10.6 oz (44.3 kg)] 97 lb 10.6 oz (44.3 kg) (07/29 0446) Exam: General: Pt alert and oriented, though baseline difficulty to understand pt remains Cardiovascular: increased rate, no murmur appreciated; unchanged from previous days Respiratory: right lung clear to auscultation, with slightly reduced breath sounds; left lung clearer than previous days, though scattered coarse crackles remain Abdomen: soft, minimal tenderness on left side Extremities: coarse tremor while awake, not present while sleeping and much reduced when not moving; otherwise moves all extremities equally, distal pulses 2+, intact/symmetric  Laboratory:  Lab 03/11/12 0630 03/10/12 0540 03/09/12 0305  WBC 14.3* 13.7* 12.9*  HGB 8.6* 8.5* 8.7*  HCT 25.4* 24.8* 25.3*  PLT 327 308 320    Lab 03/11/12 0630 03/10/12 0540 03/09/12 0305 03/06/12 0640 03/05/12 0550  NA 143 143 141 -- --  K 4.3 4.0 3.7 -- --  CL 104 106 107 -- --  CO2 27 26 26  -- --  BUN 5* 5* 4* -- --  CREATININE 1.31* 1.15* 0.93 -- --  CALCIUM 8.3* 8.3* 8.0* -- --  PROT 7.2 -- -- 6.2 5.8*  BILITOT 0.9 -- -- 1.0 1.0  ALKPHOS 79 -- -- 64 59  ALT 8 -- -- 13 14  AST 18 -- -- 27 31    GLUCOSE 87 82 116* -- --   HIV: nonreactive, 7/18 Fecal occult blood:  Negative on 7/20  POSITIVE on 7/23  Imaging/Diagnostic Tests: CXR, 7/18 @1424  IMPRESSION:  Dense pneumonia in the left lung, probably the left upper lobe.  CT, 7/18 @2028  IMPRESSION:  Dense left upper lobe and superior segment left lower lobe  consolidation, most compatible with pneumonia. Collapse with  proximal endobronchial lesion is felt less likely.  Gallstones with possible choledocholithiasis or volume averaging  from pancreatic calcification. Consider MRCP for further  evaluation.  Hepatic steatosis.  Trace upper abdominal ascites.  MRCP, 7/19 @1243  IMPRESSION:  1. Moderate to severely degraded exam, secondary to motion and  inability to hold breath.  2. No biliary ductal dilatation. Distal common duct findings,  which when combined with CT findings, are most consistent with  small distal common duct stones. Concurrent cholelithiasis, as on CT.  3. Findings of chronic pancreatitis, without definite acute  superimposed process.  4. Marked hepatic steatosis.  5. Left base air space disease and adjacent fluid, as before.  6. Renal and perirenal edema with suggestion of decreased contrast  excretion on delayed images. Question acute or subacute renal  Insufficiency.  CXR, 7/21 IMPRESSION:  1. Significantly worsening aeration throughout the left lung,  compatible with progressive multilobar pneumonia.  2. New small bilateral pleural effusions.  3. Atherosclerosis.  CT Chest, 7/23 @1520  IMPRESSION:  1. Progressive multifocal pneumonia on the left with  consolidation, cavitation and lobar expansion.  These features are  most suggestive of infection with a gram-negative organism  (Klebsiella, E Coli or Pseudomonas).  2. Associated small right greater than left pleural effusions and  probable reactive mediastinal lymphadenopathy.  CT Head, 7/26 @1331  IMPRESSION:  1. No acute intracranial  abnormality. No acute traumatic injury  identified.  2. Age advanced global atrophy.  CXR, 7/28 @1402  IMPRESSION:  Right lung is clear. Persistent consolidation with air bronchogram  and cavitation in the left upper lobe consistent with pneumonia.  Slight improvement in aeration left lower lobe.  Assessment/Plan: [name] is a 51yo female with PMH significant for HTN, EtOH abuse/alcoholic hepatitis, and breast CA (2001, s/p lumpectomy, chemo, and radiation therapy) presenting with inability to tolerate PO with shortness of breath. Admitted from clinic on 7/18.   ## Mental status/tremor - Waxing and waning confusion/reduced responsiveness, coarse tremor on exam especially with movement, and global atrophy on CT 7/26 s/p fall. Also with new complaint of diarrhea, per nursing. Given history of heavy alcohol use and little apparent clinical improvement of pt's pneumonia in addition to the above symptoms, neurology has been consulted, 7/28 (Dr. Lu Duffel). CXR improving slowly. EKG without changed, lactic acid slightly increased, ammonia normal. Blood cultures and Legionella pending. UA and urine culture to be collected 7/29. PLAN - Neuro recommendations appreciated. Will follow pending labs and continue to wean sedating meds; Ativan 0.5 mg BID, to once daily tomorrow, then stop.  1. Shortness of breath/Cough: CXR/CT findings PNA of LUL with cavitation. DDx also included PE, but less likely. Initially thought to be related to aspiration due to hx of alcoholic abuse, but this is less clear, now. Subjective fevers at home, none here so far this admission. TB considered in DDx, PPD placed 7/20, negative on 7/22. Vancomycin added 7/21 as pt was not improving on Zosyn + azithromycin. Pulmonology (Dr. Molli Knock) was consulted 7/25 for evidence of cavitary PNA with little clinical improvement. Respiratory culture shows only few yeast, compatible with Candida, ?contaminant. Vancomycin discontinued 7/29 due to  kidney function. Lung exams stable the last 2-3 days, improved from admission but still with coarse sounds. WBC drifting upwards again. PLAN -  Continue Zosyn (started 7/18). Septra started 7/29 to cover MRSA since vanc discontinued, as above. Supplemental O2 as needed.Azithromycin started on 7/19 for 3 days to cover atypicals, completed. Appreciate pulmonology recommendations/assistance.  2. Anemia - Hb 11.0 in April 2013, 8.5 on admission this time. Trended down to 6.9 on 7/19. Now 7.6 on 7/20. Most likely mixed vitamin/iron deficiencies secondary to EtOH abuse and general poor PO intake; MCV near upper limit of normal with one measurement 101.6, and iron panel on 7/21 shows low iron and TIBC but elevation of ferritin.Oral iron started 7/21, but discontinued 7/23; pt likely with iron metabolism derangement secondary to alcoholism and we do not want to iron overload her.  Stool initially were hemoccult negative, no bloody emesis. Stool hemoccult positive on 7/23. Hb 5.6, pt transfused 2 units on 7/24 with Hb rise to 9.5. Hb with slight slow drift down again, 7/26 9.0 --> 7/27 8.7 --> 7/28 8.5. Stable 7/29 at 8.6. PLAN - Will continue to monitor CBC's. Continue multivitamin, folate, thiamine. May refer pt for outpt GI follow-up vs re-consult during this admission, per Dr. Marzetta Board note on 7/24.  3. Abdominal pain/anorexia: Patient with history of EtOH abuse and EtOH induced hepatitis, initially presented with abdominal pain, icterus and inability to tolerate PO. Broad differential including pancreatitis, hepatitis, malignancy, gallstones, gastritis. Admitted to telemetry and  for hydration. CT shows LUL PNA, gallstones with possible choledocolithiasis and hepatic steatosis with trace upper abd ascites. MRCP on 7/19 is a generally poor study, shows current cholelithiasis and chronic pancreatitis and nonspecific distal common duct findings. Abdominal pain appears resolved; some diarrhea 7/27-7/28. C.diff  negative. PLAN - Immodium given x1, 7/28. Monitor for complaints. Protonix daily. Cont. Zofran PRN for nausea. GI outpt follow-up vs re-consult, as above.  4. HTN: Slightly low BP on admission, anti-hypertensives were held, then intermittent elevated pressures. Home HCTZ restarted on 7/21, restarted lisinopril 7/25; lisinopril increased to 40 mg daily on 7/27.  BP intermittently increased to 170's/100's, 7/28-7/29. PLAN - Continue HCTZ, lisinopril. Labetalol PRN, maintain pressures <180/110.  5. EtOH abuse: Last drink reported >3 days prior to admission, deemed unlikely to withdraw if she has truly not had anything to drink more recently. CIWA protocol initiated due to clinical appearance, requiring intermittent dosing 7/19-7/27. Pt was started on scheduled Ativan on 7/22. CIWA last renewed 7/24; pt received 4 doses on 7/26-7/27 overnight, probably inappropriately as pt is well outside the window for EtOH withdrawal and has had tremor, tachycardia, and elevated BP since admission; CIWA discontinued 7/27. PLAN - Continue scheduled Ativan, weaning per neuro recommendations as above (0.5 mg BID today, 0.5 daily tomorrow, then stop). Multivitamin, folic acid, thiamine.  6. Tachycardia: Initially believed likely secondary to dehydration given hx of inability to tolerate PO, no evidence of PE since admission. Also considered possible EtOH withdrawal and/or component of anemia, as above. Continues/persistent, though intermittent pulse rate in normal range around 80-90's. Monitor with other signs/symptoms.  7. Depression: Has expressed passive SI in the past as well as in clinic on 7/18. No plan vocalized. Will continue home SSRI and monitor closely.   8. PT/OT - Updated notes reflect recommendation for SNF placement.CSW actively involved and pt agreeable. Assistance and recommendations from all sides is appreciated!  FEN/GI:   full liquid diet, advance as tolerated  NS @ 75 mL/hr. MVI with thiamine and  folic acid.  Multiple K and Mg replenishments throughout stay; will continue as appropriate. PPX: Lovenox d/ced 7/23 as FOB is positive, SCD's only for now; Protonix.  Dispo: Pending further work up, likely to SNF  Bobbye Morton, MD PGY-1, [facility] FPTS Intern pager: [phone]

## 2012-03-11 NOTE — Progress Notes (Signed)
Name: [name] MRN: [id number] DOB: [birth year]    LOS: 11  Referring MD:  [facility] FPTS Reason for Consult: Multi-focal PNA    [facility] Note   History of Present Illness:  51 y/o F with PMH of HTN, ETOH abuse, ETOH hepatitis, breast cancer s/p lumpectomy, chemo/XRT admitted on 7/18 with a one week hx of increasing SOB, productive cough and decreased PO intake with n/v.  Also notes diarrhea.  Work up demonstrated cxr with dense PNA in LUL. Further evaluated with CT chest on 7/23 with dense L PNA and cavitation.  7/24 PCCM consulted for multifocal PNA.   Cultures: Sputum>>>Pending  Antibiotics: 7/19 Azithro>>> 7/18 Zosyn>>> 7/21 Vacno>>>  Tests / Events: 7/23 CT Chest>>>Progressive multifocal pneumonia on the left with consolidation, cavitation and lobar expansion. These features are most suggestive of infection with a gram-negative organism (Klebsiella, E Coli or Pseudomonas).  Associated small right greater than left pleural effusions and probable reactive mediastinal lymphadenopathy.  Subjective: Denies acute complaints.  Indicates dry cough - no sputum production.   Vital Signs: Temp:  [97.8 F (36.6 C)-98.8 F (37.1 C)] 97.8 F (36.6 C) (07/29 0446) Pulse Rate:  [102-109] 102  (07/29 0446) Resp:  [20-22] 22  (07/29 0446) BP: (154-178)/(98-121) 154/98 mmHg (07/29 0522) SpO2:  [98 %-100 %] 100 % (07/29 0446) Weight:  [44.3 kg (97 lb 10.6 oz)] 44.3 kg (97 lb 10.6 oz) (07/29 0446) I/O last 3 completed shifts: In: 2283.3 [P.O.:810; I.V.:1073.3; IV Piggyback:400] Out: 300 [Urine:300]  Physical Examination: General: thin adult female in NAD, temporal wasting noted Neuro: awakens, more alert / interactive CV: s1s2 rrr, no m/r/g PULM: resp's even/non-labored, left coarse rhonchi GI: flat, soft, bsx4 active Extremities: warm/dry, no edema  Labs    CBC  Lab 03/11/12 0630 03/10/12 0540 03/09/12 0305  HGB 8.6* 8.5* 8.7*  HCT 25.4* 24.8* 25.3*  WBC 14.3* 13.7*  12.9*  PLT 327 308 320   BMET  Lab 03/11/12 0630 03/10/12 0540 03/09/12 0305 03/08/12 0640 03/07/12 1626 03/05/12 0550  NA 143 143 141 141 136 --  K 4.3 4.0 -- -- -- --  CL 104 106 107 105 101 --  CO2 27 26 26 25 23  --  GLUCOSE 87 82 116* 82 117* --  BUN 5* 5* 4* 4* 3* --  CREATININE 1.31* 1.15* 0.93 0.86 0.75 --  CALCIUM 8.3* 8.3* 8.0* 7.3* 7.0* --  MG 1.0* -- -- -- 2.3 0.6*  PHOS -- -- -- -- -- --    No results found for this basename: INR:5 in the last 168 hours  No results found for this basename: PHART:5,PCO2:5,PCO2ART:5,PO2:5,PO2ART:5,HCO3:5,TCO2:5,O2SAT:5 in the last 168 hours   Radiology: 7/21 CXR>>>Significantly worsening aeration throughout the left lung, compatible with progressive multilobar pneumonia. . New small bilateral pleural effusions.  Atherosclerosis  7/23 CT Chest>>>Progressive multifocal pneumonia on the left with consolidation, cavitation and lobar expansion. These features are most suggestive of infection with a gram-negative organism (Klebsiella, E Coli or Pseudomonas).  Associated small right greater than left pleural effusions and probable reactive mediastinal lymphadenopathy.  Assessment and Plan: Principal Problem:  *Pneumonia Active Problems:  Cholelithiasis  Abnormal finding on GI tract imaging  Necrotizing pneumonia  Hypoxemia  Alcohol withdrawal  Altered mental status  Left cavitary PNA  Assessment: Extensive L PNA with cavitations.  DDx includes hx of ETOH abuse with aspiration, CAP, less likely TB.  HIV negative 7/18.  Denies IV drug use.  PPD negative.    Plan: -Continue current abx, likely  will need PICC line for extended IV abx once cultures are resulted to be able to narrow but now are still pending, candida in the sputum is not an issue. -PPD negative. -Will need repeat CT in 3 months or based on clinical status. -Caution with ativan and sedating effects given current respiratory status.  -No indication for bronchoscopy at this  time, this is not a post obstructive type pneumonia.  ETOH Hx Plan: -Monitor for w/d symptoms -Ativan judiciously and only for agitation. -Neuro consult noted.   Alyson Reedy, M.D. [facility] Pulmonary/Critical Care Medicine. Pager: [phone]. After hours pager: [phone].

## 2012-03-11 NOTE — Progress Notes (Signed)
On review of patient labs and med, it would appear that most recent rise in creatinine is in response to increase in Lisinopril dose.  Some increase is expected but patient's increase is larger than is allowable (>30% from baseline).  Will d/c this med and start norvasc for bp control.  [name] 03/11/2012, 3:29 PM

## 2012-03-11 NOTE — Progress Notes (Signed)
Clinical Child psychotherapist (CSW) observed from other providers that pt mental status has waxed and waned. CSW contacted pt sister, Orinda Kenner [phone] (her information was not previously in the system.) [name] stated she recently had her information updated in pt file when she came to see pt on Friday, as she is pt main contact. CSW informed [name] of plan to find placement for pt and [name] stated she was agreeable. [name] stated pt could live with her once discharging from the facility however at this moment [name] works and is not home 24hrs a day. [name] requested for FMTS to contact her with updates pertaining to pt's progress. CSW informed [name] that CSW would inform the team of request. CSW also provided sister with the information to [facility], the facility that has offered placement to pt. [name] is agreeable to sign pt in and appreciative of CSW assistance. CSW to continue following and facilitate with dc when pt stable.  Theresia Bough, MSW, [facility] [phone]

## 2012-03-11 NOTE — Progress Notes (Signed)
Clinical Child psychotherapist (CSW) received a message from [place] with pasarr that she would come to the hospital today to evaluate pt who has been referred for a level 2 pasarr.   Theresia Bough, MSW, [facility] [phone]

## 2012-03-11 NOTE — Progress Notes (Signed)
Physical Therapy Treatment Patient Details Name: CLARETTA KENDRA MRN: [id number] DOB: 11-27-1960 Today's Date: 03/11/2012 Time: 0902-0922 PT Time Calculation (min): 20 min  PT Assessment / Plan / Recommendation Comments on Treatment Session  Pt very soft-spoken; thought she had done something wrong and "been put away" Cooperative. Needs continued therapy to address weakness and decr balance    Follow Up Recommendations  Skilled nursing facility;Supervision/Assistance - 24 hour    Barriers to Discharge        Equipment Recommendations  Defer to next venue    Recommendations for Other Services    Frequency Min 3X/week   Plan Discharge plan remains appropriate;Frequency remains appropriate    Precautions / Restrictions Precautions Precautions: Fall   Pertinent Vitals/Pain     Mobility  Bed Mobility Bed Mobility: Sit to Supine;Scooting to HOB Sit to Supine: 5: Supervision;HOB flat Scooting to HOB: 5: Supervision Details for Bed Mobility Assistance: Pt remains weak but with better problem solving to move herself into and up in bed; cues for technique Transfers Transfers: Sit to Stand;Stand to Sit Sit to Stand: 4: Min guard;With upper extremity assist;With armrests;From chair/3-in-1;From bed Stand to Sit: 4: Min guard;With upper extremity assist;To bed Details for Transfer Assistance: pt requires assist due to weakness and posterior lean Ambulation/Gait Ambulation/Gait Assistance: Not tested (comment) (due to diarhhea)    Exercises General Exercises - Lower Extremity Straight Leg Raises: AROM;10 reps;Both;Supine Low Level/ICU Exercises Ankle Circles/Pumps: AROM;Both;20 reps;Supine Stabilized Bridging: AROM;15 reps;Supine   PT Diagnosis:    PT Problem List:   PT Treatment Interventions:     PT Goals Acute Rehab PT Goals Pt will Sit at [facility] of Bed: with supervision;3-5 min;with no upper extremity support;Other (comment) PT Goal: Sit at [facility] Of Bed - Progress: Progressing  toward goal Pt will go Sit to Supine/Side: with modified independence;with HOB 0 degrees PT Goal: Sit to Supine/Side - Progress: Progressing toward goal Pt will go Sit to Stand: with modified independence;with upper extremity assist PT Goal: Sit to Stand - Progress: Progressing toward goal Pt will go Stand to Sit: with modified independence;without upper extremity assist PT Goal: Stand to Sit - Progress: Progressing toward goal Pt will Perform Home Exercise Program: with supervision, verbal cues required/provided PT Goal: Perform Home Exercise Program - Progress: Progressing toward goal  Visit Information  Last PT Received On: 03/11/12 Assistance Needed: +1    Subjective Data  Subjective: Pt requesting not to leave room due to diarhhea   Cognition  Overall Cognitive Status: Impaired Area of Impairment: Safety/judgement;Awareness of deficits Arousal/Alertness: Awake/alert Orientation Level: Disoriented to;Place;Time;Situation Behavior During Session: Flat affect Attention - Other Comments: slow to process cues/instructions Safety/Judgement: Decreased awareness of safety precautions;Decreased awareness of need for assistance Safety/Judgement - Other Comments: per nurse tech, pt fell this weekend getting up by herself Awareness of Deficits: Pt unaware of balance deficits and fall risk.    Balance  Balance Balance Assessed: Yes Static Sitting Balance Static Sitting - Balance Support: No upper extremity supported;Feet supported Static Sitting - Level of Assistance: 5: Stand by assistance Static Sitting - Comment/# of Minutes: posterior bias but maintains balance Static Standing Balance Rhomberg - Eyes Opened: 30  (incr sway with minguard assist) [place] - Eyes Closed: 10  (min asssist due to incr sway with loss of balance) Dynamic Standing Balance Dynamic Standing - Balance Support: Left upper extremity supported (vs no UE assist) Dynamic Standing - Level of Assistance: 4: Min  assist Dynamic Standing - Comments: marching x 1 minute; stepping  forward and back x 2 minutes; turn 360 with 12 steps (Rt and Lt); reaching 10 in  End of Session PT - End of Session Equipment Utilized During Treatment: Gait belt Activity Tolerance: Patient limited by fatigue;Treatment limited secondary to medical complications (Comment) (frequent diarhhea) Patient left: in bed;with call bell/phone within reach;with bed alarm set Nurse Communication: Mobility status   GP     Emagene Merfeld 03/11/2012, 9:37 AM Pager [phone]

## 2012-03-12 DIAGNOSIS — J690 Pneumonitis due to inhalation of food and vomit: Secondary | ICD-10-CM

## 2012-03-12 LAB — BASIC METABOLIC PANEL
CO2: 24 mEq/L (ref 19–32)
Chloride: 105 mEq/L (ref 96–112)
Glucose, Bld: 78 mg/dL (ref 70–99)
Potassium: 4.8 mEq/L (ref 3.5–5.1)
Sodium: 141 mEq/L (ref 135–145)

## 2012-03-12 LAB — CBC
Hemoglobin: 8.3 g/dL — ABNORMAL LOW (ref 12.0–15.0)
Platelets: 287 10*3/uL (ref 150–400)
RBC: 2.53 MIL/uL — ABNORMAL LOW (ref 3.87–5.11)
WBC: 11.4 10*3/uL — ABNORMAL HIGH (ref 4.0–10.5)

## 2012-03-12 LAB — URINE CULTURE: Colony Count: NO GROWTH

## 2012-03-12 LAB — MAGNESIUM: Magnesium: 0.9 mg/dL — CL (ref 1.5–2.5)

## 2012-03-12 MED ORDER — ENSURE COMPLETE PO LIQD
237.0000 mL | Freq: Three times a day (TID) | ORAL | Status: DC
  Administered 2012-03-12 – 2012-03-17 (×10): 237 mL via ORAL

## 2012-03-12 MED ORDER — HYDRALAZINE HCL 20 MG/ML IJ SOLN
5.0000 mg | INTRAMUSCULAR | Status: DC | PRN
  Administered 2012-03-13: 5 mg via INTRAVENOUS
  Filled 2012-03-12: qty 0.25

## 2012-03-12 MED ORDER — AMOXICILLIN-POT CLAVULANATE 500-125 MG PO TABS
1.0000 | ORAL_TABLET | Freq: Two times a day (BID) | ORAL | Status: DC
  Administered 2012-03-13 – 2012-03-17 (×10): 500 mg via ORAL
  Filled 2012-03-12 (×13): qty 1

## 2012-03-12 MED ORDER — LORAZEPAM 0.5 MG PO TABS
0.2500 mg | ORAL_TABLET | Freq: Two times a day (BID) | ORAL | Status: AC
  Administered 2012-03-12 (×2): 0.25 mg via ORAL
  Filled 2012-03-12 (×2): qty 1

## 2012-03-12 MED ORDER — MAGNESIUM SULFATE 40 MG/ML IJ SOLN
4.0000 g | Freq: Once | INTRAMUSCULAR | Status: AC
  Administered 2012-03-12: 4 g via INTRAVENOUS
  Filled 2012-03-12: qty 100

## 2012-03-12 MED ORDER — ENSURE COMPLETE PO LIQD: 237.0000 mL | Freq: Three times a day (TID) | ORAL | Status: DC

## 2012-03-12 MED ORDER — ACETAMINOPHEN 325 MG PO TABS: 325.0000 mg | ORAL_TABLET | Freq: Four times a day (QID) | ORAL | Status: DC | PRN

## 2012-03-12 MED ORDER — MAGNESIUM CHLORIDE 64 MG PO TBEC
2.0000 | DELAYED_RELEASE_TABLET | Freq: Every day | ORAL | Status: DC
  Administered 2012-03-12: 128 mg via ORAL
  Filled 2012-03-12: qty 2

## 2012-03-12 MED ORDER — METOPROLOL TARTRATE 25 MG PO TABS
25.0000 mg | ORAL_TABLET | Freq: Two times a day (BID) | ORAL | Status: DC
  Administered 2012-03-12 – 2012-03-17 (×11): 25 mg via ORAL
  Filled 2012-03-12 (×13): qty 1

## 2012-03-12 NOTE — Progress Notes (Signed)
Nutrition Follow-up  Intervention:   1. Continue Ensure TID 2. Encouraged PO intake to maintain weight 3. If pt is unable to maintain weight and meet nutrition needs through PO intake, consider placement of Panda tube for temporary EN  4. RD will continue to follow    Assessment:   Pt reports appetite is fair, but has ben nauseated which is decreasing her intake. Pt reports UBW to be 120 lbs but has not weighed that in over a year. Weight was steady this admission until 7/29 when she lost 9 lbs from the day before, ? Accuracy. Pt with additional 2 lb loss again from yesterday to today.   Current Body mass index is 14.99 kg/(m^2). Pt is underweight and per initial nutrition note Patient meets criteria for severe malnutrition in the context of chronic illness gviven < 75% intake of energy requirement for > 1 month & severe muscle loss    Diet Order:  Dysphagia 3, thin liquids Supplements: Ensure Complete, now increased from BID to TID.   Meds: Scheduled Meds:   . amLODipine  10 mg Oral Daily  . feeding supplement  237 mL Oral TID BM  . folic acid  1 mg Oral Daily  . LORazepam  0.25 mg Oral BID  . magnesium sulfate 1 - 4 g bolus IVPB  4 g Intravenous Once  . metoprolol tartrate  25 mg Oral BID  . multivitamin with minerals  1 tablet Oral Daily  . pantoprazole  40 mg Oral Q1200  . potassium chloride  60 mEq Oral BID  . sodium chloride  3 mL Intravenous Q12H  . thiamine  100 mg Oral Daily   Or  . thiamine  100 mg Intravenous Daily  . tuberculin  5 Units Intradermal Once  . venlafaxine XR  150 mg Oral Daily  . DISCONTD: feeding supplement  237 mL Oral BID BM  . DISCONTD: feeding supplement  237 mL Oral TID BM  . DISCONTD: hydrochlorothiazide  25 mg Oral Daily  . DISCONTD: lisinopril  40 mg Oral Daily  . DISCONTD: LORazepam  0.5 mg Oral BID  . DISCONTD: magnesium chloride  2 tablet Oral Daily  . DISCONTD: magnesium chloride  1 tablet Oral Daily  . DISCONTD: piperacillin-tazobactam  (ZOSYN)  IV  3.375 g Intravenous Q8H  . DISCONTD: sulfamethoxazole-trimethoprim  1 tablet Oral Q12H   Continuous Infusions:   . sodium chloride 100 mL/hr at 03/12/12 0007   PRN Meds:.acetaminophen, diphenhydrAMINE, hydrALAZINE, ondansetron (ZOFRAN) IV, ondansetron, sodium chloride, DISCONTD: ibuprofen, DISCONTD: labetalol  Labs:  CMP     Component Value Date/Time   NA 141 03/12/2012 0555   K 4.8 03/12/2012 0555   CL 105 03/12/2012 0555   CO2 24 03/12/2012 0555   GLUCOSE 78 03/12/2012 0555   BUN 8 03/12/2012 0555   CREATININE 1.45* 03/12/2012 0555   CREATININE 0.89 12/15/2011 1210   CALCIUM 8.2* 03/12/2012 0555   PROT 7.2 03/11/2012 0630   ALBUMIN 1.5* 03/11/2012 0630   AST 18 03/11/2012 0630   ALT 8 03/11/2012 0630   ALKPHOS 79 03/11/2012 0630   BILITOT 0.9 03/11/2012 0630   GFRNONAA 42* 03/12/2012 0555   GFRAA 48* 03/12/2012 0555     Intake/Output Summary (Last 24 hours) at 03/12/12 1454 Last data filed at 03/12/12 1300  Gross per 24 hour  Intake   1303 ml  Output    852 ml  Net    451 ml    Weight Status:   [employer]  03/10/12 0416 03/11/12 0446 03/12/12 0319  Weight: 106 lb 4.2 oz (48.2 kg) 97 lb 10.6 oz (44.3 kg) 95 lb 10.9 oz (43.4 kg)   Re-estimated needs:  1500-1700 kcal, 60-70 gm protein  Nutrition Dx:  Inadequate oral intake, ongoing   Goal:  PO intake to meet >/=90%, unmet New Goal: Initiation of EN if continues to lose weight and not meet nutrition needs with PO diet  Monitor:  PO intake, weight, labs, I/O's   Clarene Duke RD, LDN Pager [phone] After Hours pager [phone]

## 2012-03-12 NOTE — Progress Notes (Signed)
Occupational Therapy Treatment Patient Details Name: [name] MRN: [id number] DOB: [birth year] Today's Date: 03/12/2012 Time: 6962-9528 OT Time Calculation (min): 13 min                  Plan Discharge plan remains appropriate    Precautions / Restrictions Precautions Precautions: Fall       ADL  Toilet Transfer: Performed;Minimal assistance Toilet Transfer Method: Sit to stand;Stand pivot Acupuncturist: Bedside commode Toileting - Clothing Manipulation and Hygiene: Performed;Moderate assistance Where Assessed - Toileting Clothing Manipulation and Hygiene: Standing ADL Comments: Encouraged pt to get to chair after bed side commode, but pt insisted on getting back in bed.  Communicated importance of getting up to the chair.      OT Goals ADL Goals ADL Goal: Toilet Transfer - Progress: Progressing toward goals ADL Goal: Toileting - Clothing Manipulation - Progress: Progressing toward goals ADL Goal: Toileting - Hygiene - Progress: Progressing toward goals  Visit Information  Last OT Received On: 03/12/12    Subjective Data  Subjective: i need to bet get to the commode- will you help me?      Cognition  Overall Cognitive Status: Impaired Area of Impairment: Safety/judgement;Awareness of deficits Arousal/Alertness: Awake/alert Orientation Level: Disoriented to;Place;Time;Situation Behavior During Session: Flat affect Attention - Other Comments: slow to process cues/instructions Safety/Judgement: Decreased awareness of safety precautions;Decreased awareness of need for assistance Safety/Judgement - Other Comments: per nurse tech, pt fell this weekend getting up by herself Awareness of Deficits: Pt unaware of balance deficits and fall risk. Cognition - Other Comments: Pt confused regarding role of OT.Pt stated she ask some questions regarding social security and was told I could help.  Educated pt Child psychotherapist could help her with these type quesstions      Mobility Bed Mobility Bed Mobility: Sit to Supine;Scooting to HOB Sit to Supine: 5: Supervision;HOB flat Scooting to HOB: 5: Supervision Details for Bed Mobility Assistance: Pt remains weak but with better problem solving to move herself into and up in bed; cues for technique Transfers Sit to Stand: With upper extremity assist;With armrests;From chair/3-in-1;From bed;4: Min assist Stand to Sit: With upper extremity assist;To bed;4: Min assist Details for Transfer Assistance: pt requires assist due to weakness and posterior lean         End of Session OT - End of Session Activity Tolerance: Patient limited by fatigue Patient left: in bed;with call bell/phone within reach;with bed alarm set  GO     Zayvion Stailey, [place] Kung 03/12/2012, 11:19 AM

## 2012-03-12 NOTE — Progress Notes (Signed)
CRITICAL VALUE ALERT  Critical value received:  Magnesium 0.9  Date of notification:  03/12/12  Time of notification:  0745  Critical value read back:yes  Nurse who received alert:  Vinie Sill  MD notified (1st page):  Dr. Benjamin Stain  Time of first page:  0755  MD notified (2nd page):  Time of second page:  Responding MD:  Dr. Benjamin Stain  Time MD responded:  0800

## 2012-03-12 NOTE — Progress Notes (Signed)
Patient ID: [name], female   DOB: 09/05/1960, 51 y.o.   MRN: [id number]    [facility] for Infectious Disease  Total days of antibiotics 13                Reason for Consult: Necrotizing pneumonia    Referring Physician: Dr. Tivis Ringer  Principal Problem:  *Necrotizing pneumonia Active Problems:  Cholelithiasis  Abnormal finding on GI tract imaging  Hypoxemia  Alcohol withdrawal  Altered mental status      . amLODipine  10 mg Oral Daily  . feeding supplement  237 mL Oral TID BM  . folic acid  1 mg Oral Daily  . LORazepam  0.25 mg Oral BID  . magnesium sulfate 1 - 4 g bolus IVPB  4 g Intravenous Once  . metoprolol tartrate  25 mg Oral BID  . multivitamin with minerals  1 tablet Oral Daily  . pantoprazole  40 mg Oral Q1200  . potassium chloride  60 mEq Oral BID  . sodium chloride  3 mL Intravenous Q12H  . thiamine  100 mg Oral Daily   Or  . thiamine  100 mg Intravenous Daily  . tuberculin  5 Units Intradermal Once  . venlafaxine XR  150 mg Oral Daily  . DISCONTD: feeding supplement  237 mL Oral BID BM  . DISCONTD: feeding supplement  237 mL Oral TID BM  . DISCONTD: hydrochlorothiazide  25 mg Oral Daily  . DISCONTD: LORazepam  0.5 mg Oral BID  . DISCONTD: magnesium chloride  2 tablet Oral Daily  . DISCONTD: magnesium chloride  1 tablet Oral Daily  . DISCONTD: piperacillin-tazobactam (ZOSYN)  IV  3.375 g Intravenous Q8H  . DISCONTD: sulfamethoxazole-trimethoprim  1 tablet Oral Q12H    Recommendations: 1. Continue antibiotic therapy with oral amoxicillin clavulanate 500 mg twice daily   Assessment: [name] has extensive left lung consolidation with evidence of microcavitation. She does seem to be improving on broad empiric therapy. She was only able to bring up sputum one week into illness at that point the specimen showed no organisms on stain and grew his twitches a contaminant. I do not think this is likely to be do to MRSA given the subacute  presentation and do not feel compelled to continue empiric therapy covering MRSA. I would use oral Augmentin and plan on at least 3 weeks of total therapy.    HPI: [name] is a 51 y.o. female with a history of heavy alcohol use who was admitted on July 18 with a one-month history of nonproductive cough, shortness of breath, fever and weight loss. She also been having some problems with nausea and vomiting. She reports 1 sore right maxillary molar recently. She was found to have a left-sided infiltrate and was started on piperacillin tazobactam and vancomycin was added 3 days later. She had mild renal insufficiency on admission which improved with hydration. Her creatinine has risen slightly again recently. Her vancomycin trough levels have been over 30. She states that she is feeling better. She's not coughing as much and her appetite has improved. Her sister, who is in the room with her, confirms that she seems to be doing better.   Review of Systems: Pertinent items are noted in HPI.  Past Medical History  Diagnosis Date  . Hypertension   . Alcoholic hepatitis   . Alcoholism   . Breast cancer 2001    Rt side. Had lumpectomy, chemo and XRT by [facility]  . Shortness  of breath     History  Substance Use Topics  . Smoking status: Former Smoker    Types: Cigarettes    Quit date: 11/02/2000  . Smokeless tobacco: Never Used  . Alcohol Use: 25.2 oz/week    42 Shots of liquor per week     states she does not drink anymore    History reviewed. No pertinent family history. No Known Allergies  OBJECTIVE: Blood pressure 100/77, pulse 104, temperature 98.3 F (36.8 C), temperature source Oral, resp. rate 20, height 5\' 7"  (1.702 m), weight 43.4 kg (95 lb 10.9 oz), SpO2 97.00%. General: Thin, alert and in no distress Skin: No rash  Lungs: She has crackles in the left side posteriorly with E to A changes of consolidation Cor: Regular S1 and S2 with no murmurs Abdomen:  Nontender  Microbiology: Recent Results (from the past 240 hour(s))  CULTURE, RESPIRATORY     Status: Normal   Collection Time   03/07/12  4:02 PM      Component Value Range Status Comment   Specimen Description TRACHEAL ASPIRATE   Final    Special Requests NONE   Final    Gram Stain     Final    Value: MODERATE WBC PRESENT,BOTH PMN AND MONONUCLEAR     MODERATE SQUAMOUS EPITHELIAL CELLS PRESENT     FEW YEAST   Culture FEW YEAST CONSISTENT WITH CANDIDA SPECIES   Final    Report Status 03/10/2012 FINAL   Final   CLOSTRIDIUM DIFFICILE BY PCR     Status: Normal   Collection Time   03/10/12  4:19 AM      Component Value Range Status Comment   C difficile by pcr NEGATIVE  NEGATIVE Final   STOOL CULTURE     Status: Normal (Preliminary result)   Collection Time   03/10/12  4:19 AM      Component Value Range Status Comment   Specimen Description STOOL   Final    Special Requests NONE   Final    Culture     Final    Value: ABUNDANT YEAST     Note: REDUCED NORMAL FLORA PRESENT   Report Status PENDING   Incomplete   CULTURE, BLOOD (ROUTINE X 2)     Status: Normal (Preliminary result)   Collection Time   03/10/12  1:00 PM      Component Value Range Status Comment   Specimen Description BLOOD LEFT ARM   Final    Special Requests BOTTLES DRAWN AEROBIC AND ANAEROBIC 10CC   Final    Culture  Setup Time 03/10/2012 18:09   Final    Culture     Final    Value:        BLOOD CULTURE RECEIVED NO GROWTH TO DATE CULTURE WILL BE HELD FOR 5 DAYS BEFORE ISSUING A FINAL NEGATIVE REPORT   Report Status PENDING   Incomplete   CULTURE, BLOOD (ROUTINE X 2)     Status: Normal (Preliminary result)   Collection Time   03/10/12  1:11 PM      Component Value Range Status Comment   Specimen Description BLOOD LEFT ARM   Final    Special Requests BOTTLES DRAWN AEROBIC AND ANAEROBIC 10CC   Final    Culture  Setup Time 03/10/2012 18:09   Final    Culture     Final    Value:        BLOOD CULTURE RECEIVED NO GROWTH  TO DATE CULTURE WILL BE HELD FOR  5 DAYS BEFORE ISSUING A FINAL NEGATIVE REPORT   Report Status PENDING   Incomplete   URINE CULTURE     Status: Normal   Collection Time   03/11/12  2:38 PM      Component Value Range Status Comment   Specimen Description URINE, CATHETERIZED   Final    Special Requests PATIENT ON FOLLOWING ZOSYN,VANCOMYCIN   Final    Culture  Setup Time 03/11/2012 15:25   Final    Colony Count NO GROWTH   Final    Culture NO GROWTH   Final    Report Status 03/12/2012 FINAL   Final     Cliffton Asters, MD [facility] [phone] pager   [phone] cell 03/12/2012, 5:18 PM

## 2012-03-12 NOTE — Progress Notes (Signed)
Family Medicine Teaching Service Daily Progress Note Intern Pager: [phone]  Patient name: [name] Medical record number: [id number] Date of birth: 24-Oct-1960 Age: 51 y.o. Gender: female  Primary Care Provider: MATTHEWS,CODY, DO  Subjective: Pt seen at bedside. Much more awake and alert this morning. States she feels better, breathing and pain is better, appetite is coming back. No complaints or questions, this morning.  Objective: Temp:  [98.1 F (36.7 C)-98.6 F (37 C)] 98.6 F (37 C) (07/30 0416) Pulse Rate:  [98-108] 106  (07/30 0416) Resp:  [20] 20  (07/30 0416) BP: (138-152)/(87-104) 152/88 mmHg (07/30 0416) SpO2:  [97 %-98 %] 98 % (07/30 0416) Weight:  [95 lb 10.9 oz (43.4 kg)] 95 lb 10.9 oz (43.4 kg) (07/30 0319) Exam: General: Pt alert and oriented, speech easier to understand this morning Cardiovascular: increased rate, no murmur appreciated; unchanged from previous days Respiratory: poor inspiratory effort; right lung clear to auscultation, with slightly reduced breath sounds; left lung clearer than previous days, though scattered coarse crackles remain Abdomen: soft, minimal tenderness on left side Extremities: coarse tremor while awake, not present while sleeping and much reduced when not moving; otherwise moves all extremities equally, distal pulses 2+, intact/symmetric  Laboratory:  Lab 03/12/12 0555 03/11/12 0630 03/10/12 0540  WBC 11.4* 14.3* 13.7*  HGB 8.3* 8.6* 8.5*  HCT 24.6* 25.4* 24.8*  PLT 287 327 308    Lab 03/12/12 0555 03/11/12 0630 03/10/12 0540 03/06/12 0640  NA 141 143 143 --  K 4.8 4.3 4.0 --  CL 105 104 106 --  CO2 24 27 26  --  BUN 8 5* 5* --  CREATININE 1.45* 1.31* 1.15* --  CALCIUM 8.2* 8.3* 8.3* --  PROT -- 7.2 -- 6.2  BILITOT -- 0.9 -- 1.0  ALKPHOS -- 79 -- 64  ALT -- 8 -- 13  AST -- 18 -- 27  GLUCOSE 78 87 82 --   HIV: nonreactive, 7/18 Fecal occult blood:  Negative on 7/20  POSITIVE on 7/23  Imaging/Diagnostic  Tests: CXR, 7/18 @1424  IMPRESSION:  Dense pneumonia in the left lung, probably the left upper lobe.  CT, 7/18 @2028  IMPRESSION:  Dense left upper lobe and superior segment left lower lobe  consolidation, most compatible with pneumonia. Collapse with  proximal endobronchial lesion is felt less likely.  Gallstones with possible choledocholithiasis or volume averaging  from pancreatic calcification. Consider MRCP for further  evaluation.  Hepatic steatosis.  Trace upper abdominal ascites.  MRCP, 7/19 @1243  IMPRESSION:  1. Moderate to severely degraded exam, secondary to motion and  inability to hold breath.  2. No biliary ductal dilatation. Distal common duct findings,  which when combined with CT findings, are most consistent with  small distal common duct stones. Concurrent cholelithiasis, as on CT.  3. Findings of chronic pancreatitis, without definite acute  superimposed process.  4. Marked hepatic steatosis.  5. Left base air space disease and adjacent fluid, as before.  6. Renal and perirenal edema with suggestion of decreased contrast  excretion on delayed images. Question acute or subacute renal  Insufficiency.  CXR, 7/21 IMPRESSION:  1. Significantly worsening aeration throughout the left lung,  compatible with progressive multilobar pneumonia.  2. New small bilateral pleural effusions.  3. Atherosclerosis.  CT Chest, 7/23 @1520  IMPRESSION:  1. Progressive multifocal pneumonia on the left with  consolidation, cavitation and lobar expansion. These features are  most suggestive of infection with a gram-negative organism  (Klebsiella, E Coli or Pseudomonas).  2. Associated small right  greater than left pleural effusions and  probable reactive mediastinal lymphadenopathy.  CT Head, 7/26 @1331  IMPRESSION:  1. No acute intracranial abnormality. No acute traumatic injury  identified.  2. Age advanced global atrophy.  CXR, 7/28 @1402  IMPRESSION:  Right lung is  clear. Persistent consolidation with air bronchogram  and cavitation in the left upper lobe consistent with pneumonia.  Slight improvement in aeration left lower lobe.  Assessment/Plan: [name] is a 51yo female with PMH significant for HTN, EtOH abuse/alcoholic hepatitis, and breast CA (2001, s/p lumpectomy, chemo, and radiation therapy) presenting with inability to tolerate PO with shortness of breath. Found on admission to have significant LUL pneumonia; also with multiple interrelated issues (HTN/tachycardia, electrolyte abnormalities, acute kidney injury). Admitted from clinic on 7/18.   1. Shortness of breath/Cough: CXR/CT findings PNA of LUL with cavitation. DDx also included PE, but less likely. Initially thought to be related to aspiration due to hx of alcoholic abuse, but this is less clear, now. Subjective fevers at home, none here so far this admission. TB considered in DDx, PPD placed 7/20, negative on 7/22. Vancomycin added 7/21 as pt was not improving on Zosyn + azithromycin. Pulmonology (Dr. Molli Knock) was consulted 7/25 for evidence of cavitary PNA with little clinical improvement. Respiratory culture shows only few yeast, compatible with Candida, ?contaminant. Vancomycin discontinued 7/29 due to kidney function, switched to Bactrim for MRSA coverage. Lung exams stable the last 2-3 days, continues slowly improving. WBC trending down again. Abx may be contributing to renal impairment. PLAN - Discontinue abx today; pt has received abx for 12 total days, MRSA coverage for 10 (Zosyn for 12, Azithro for 3, Vanc for 9, Bactrim for 1). Will monitor closely for increased O2 need, fevers, etc and may end up restarting some abx therapy. Supplemental O2 as needed. Incentive spirometry. Appreciate pulmonology recommendations/assistance.  2. Mental status/tremor - Waxing and waning confusion/reduced responsiveness, coarse tremor on exam especially with movement, and global atrophy on CT 7/26 s/p fall.  Also with new complaint of diarrhea, per nursing. Given history of heavy alcohol use and little apparent clinical improvement of pt's pneumonia in addition to the above symptoms, neurology has been consulted, 7/28 (Dr. Lu Duffel). CXR improved on 7/28 improved slightly from previous days. EKG without changed, lactic acid slightly increased, ammonia normal. UA unremarkable; negative but pt has been on abx for multiple days.  Blood cultures show no growth to date and Legionella negative. Mental status still with some slight waxing and waning but overall much improved. PLAN - Continue to wean sedating meds; Ativan 0.25 mg BID today, likely stop completely tomorrow. Neuro recommendations appreciated.   3. Acute kidney injury - Pt very thin with little muscle mass, with initial Cr of 1.13, trended down to 0.47 with IVF and improved hydration. Slow increase since that time, to 1.45 on 7/30. Etiology unclear, probably multifactorial; pt has been on vancomycin (7/21-7/29) and Bactrim (7/29-7/30), lisinopril restarted 7/25 and dosage increased 7/28. Pt also complaining of intermittent diarrhea and on diuretic (HCTZ) with generally poor PO intake. PLAN - Will avoid nephrotoxins; abx discontinued 7/30 as above, lisinopril stopped 7/29. Will also address any further diarrhea. If Cr does not improve, will consider renal consult. Will likely recommend Cr check one week after discharge to SNF, as well, regardless.  4. HTN: Slightly low BP on admission, anti-hypertensives were held, then intermittent elevated pressures. Home HCTZ restarted on 7/21, restarted lisinopril 7/25; lisinopril increased to 40 mg daily on 7/27. Lisinopril stopped 7/29 due to elevated  creatinine, switched to Norvasc 10. BP remains elevated, with tachycardia intermittently. PLAN - Continue Norvasc 10 BID, started 7/29. Starting metoprolol 25 mg BID on 7/30. Hydralazine PRN for BP >180/100.  5. Anemia - Hb 11.0 in April 2013, 8.5 on admission this time.  Trended down to 6.9 on 7/19. Now 7.6 on 7/20. Most likely mixed vitamin/iron deficiencies secondary to EtOH abuse and general poor PO intake; MCV near upper limit of normal with one measurement 101.6, and iron panel on 7/21 shows low iron and TIBC but elevation of ferritin.Oral iron started 7/21, but discontinued 7/23; pt likely with iron metabolism derangement secondary to alcoholism and we do not want to iron overload her.  Stool initially were hemoccult negative, no bloody emesis. Stool hemoccult positive on 7/23. Hb 5.6, pt transfused 2 units on 7/24 with Hb rise to 9.5. Hb with slight slow drift down again, 7/26 9.0 --> 7/27 8.7 --> 7/28 8.5. Stable 7/29 at 8.6, 7/30 at 8.3. PLAN - Will continue to monitor CBC's. Drift may be at least partially due to extensive lab draws. Continue multivitamin, folate, thiamine. May refer pt for outpt GI follow-up vs re-consult during this admission, per Dr. Marzetta Board note on 7/24.  6. Tachycardia: Initially believed likely secondary to dehydration given hx of inability to tolerate PO, no evidence of PE since admission. Also considered possible EtOH withdrawal and/or component of anemia, as above. Given elevated BP, DDx could also include more exotic causes such as carcinoid, pheochromocytoma, etc. Continues/persistent, though intermittent pulse rate in normal range around 80-90's. Monitor with other signs/symptoms, as beta blocker scheduled is being started 7/30, as above.  7. Abdominal pain/anorexia: Patient with history of EtOH abuse and EtOH induced hepatitis, initially presented with abdominal pain, icterus and inability to tolerate PO. Broad differential including pancreatitis, hepatitis, malignancy, gallstones, gastritis. Admitted to telemetry and for hydration. CT shows LUL PNA, gallstones with possible choledocolithiasis and hepatic steatosis with trace upper abd ascites. MRCP on 7/19 is a generally poor study, shows current cholelithiasis and chronic pancreatitis  and nonspecific distal common duct findings. Abdominal pain appears resolved; some complaints diarrhea intermittently. C.diff negative 7/28. Stool cultures show yeast and reduced normal flora. PLAN - Immodium given x1, 7/28. Will monitor clinically; hopefully will improve as abx come off. Protonix daily. Cont. Zofran PRN for nausea. GI outpt follow-up vs re-consult, as above.  8. EtOH abuse: Last drink reported >3 days prior to admission, deemed unlikely to withdraw if she has truly not had anything to drink more recently. CIWA protocol initiated due to clinical appearance, requiring intermittent dosing 7/19-7/27. Pt was started on scheduled Ativan on 7/22. CIWA last renewed 7/24; pt received 4 doses on 7/26-7/27 overnight, probably inappropriately as pt is well outside the window for EtOH withdrawal and has had tremor, tachycardia, and elevated BP since admission; CIWA discontinued 7/27. PLAN - Continue scheduled Ativan then d/c, weaning per neuro recommendations as above (0.25 mg BID today, then stop tomorrow). Multivitamin, folic acid, thiamine.  9. Depression: Has expressed passive SI in the past as well as in clinic on 7/18. No plan vocalized. Will continue home SSRI and monitor closely. Possible contribution to elevated BP by Effexor; may consider changing.  10. PT/OT - Updated notes reflect recommendation for SNF placement.CSW actively involved and pt agreeable. Assistance and recommendations from all sides is appreciated!  FEN/GI:   full liquid diet, advance as tolerated  NS @ 75 mL/hr. MVI with thiamine and folic acid.  Multiple K and Mg replenishments throughout stay;  will continue as appropriate.   K: 60 mEq PO BID   Mg: IV Mg today PPX: Lovenox d/ced 7/23 as FOB is positive, SCD's only for now; Protonix.  Dispo: Pending clinical improvementely to SNF  Bobbye Morton, MD PGY-1, [facility] Family Medicine FPTS Intern pager: [phone]

## 2012-03-12 NOTE — Progress Notes (Signed)
Name: [name] MRN: [id number] DOB: Oct 11, 1960    LOS: 12  Referring MD:  [facility] FPTS Reason for Consult: Multi-focal PNA     Pulmonary Note   History of Present Illness:  51 y/o F with PMH of HTN, ETOH abuse, ETOH hepatitis, breast cancer s/p lumpectomy, chemo/XRT admitted on 7/18 with a one week hx of increasing SOB, productive cough and decreased PO intake with n/v.  Also notes diarrhea.  Work up demonstrated cxr with dense PNA in LUL. Further evaluated with CT chest on 7/23 with dense L PNA and cavitation.  7/24 PCCM consulted for multifocal PNA.   Cultures: Sputum>>>Pending  Antibiotics: 7/19 Azithro>>> 7/18 Zosyn>>> 7/21 Vacno>>>  Tests / Events: 7/23 CT Chest>>>Progressive multifocal pneumonia on the left with consolidation, cavitation and lobar expansion. These features are most suggestive of infection with a gram-negative organism (Klebsiella, E Coli or Pseudomonas).  Associated small right greater than left pleural effusions and probable reactive mediastinal lymphadenopathy.  Subjective: Denies acute complaints.  Indicates dry cough - no sputum production.   Vital Signs: Temp:  [98.1 F (36.7 C)-98.6 F (37 C)] 98.6 F (37 C) (07/30 0416) Pulse Rate:  [98-108] 106  (07/30 0416) Resp:  [20] 20  (07/30 0416) BP: (138-152)/(87-104) 152/88 mmHg (07/30 0416) SpO2:  [97 %-98 %] 98 % (07/30 0416) Weight:  [43.4 kg (95 lb 10.9 oz)] 43.4 kg (95 lb 10.9 oz) (07/30 0319) I/O last 3 completed shifts: In: 1793 [P.O.:1640; I.V.:3; IV Piggyback:150] Out: 1453 [Urine:1450; Stool:3]  Physical Examination: General: thin adult female in NAD, temporal wasting noted Neuro: awakens, more alert / interactive CV: s1s2 rrr, no m/r/g PULM: resp's even/non-labored, left coarse rhonchi, no accessory muscle  Use  GI: flat, soft, bsx4 active Extremities: warm/dry, no edema  Labs    CBC  Lab 03/12/12 0555 03/11/12 0630 03/10/12 0540  HGB 8.3* 8.6* 8.5*  HCT 24.6* 25.4*  24.8*  WBC 11.4* 14.3* 13.7*  PLT 287 327 308   BMET  Lab 03/12/12 0555 03/11/12 0630 03/10/12 0540 03/09/12 0305 03/08/12 0640 03/07/12 1626  NA 141 143 143 141 141 --  K 4.8 4.3 -- -- -- --  CL 105 104 106 107 105 --  CO2 24 27 26 26 25  --  GLUCOSE 78 87 82 116* 82 --  BUN 8 5* 5* 4* 4* --  CREATININE 1.45* 1.31* 1.15* 0.93 0.86 --  CALCIUM 8.2* 8.3* 8.3* 8.0* 7.3* --  MG 0.9* 1.0* -- -- -- 2.3  PHOS -- -- -- -- -- --    No results found for this basename: INR:5 in the last 168 hours  No results found for this basename: PHART:5,PCO2:5,PCO2ART:5,PO2:5,PO2ART:5,HCO3:5,TCO2:5,O2SAT:5 in the last 168 hours   Radiology: 7/21 CXR>>>Significantly worsening aeration throughout the left lung, compatible with progressive multilobar pneumonia. . New small bilateral pleural effusions.  Atherosclerosis  7/23 CT Chest>>>Progressive multifocal pneumonia on the left with consolidation, cavitation and lobar expansion. These features are most suggestive of infection with a gram-negative organism (Klebsiella, E Coli or Pseudomonas).  Associated small right greater than left pleural effusions and probable reactive mediastinal lymphadenopathy.  Assessment and Plan: Principal Problem:  *Pneumonia Active Problems:  Cholelithiasis  Abnormal finding on GI tract imaging  Necrotizing pneumonia  Hypoxemia  Alcohol withdrawal  Altered mental status  Left cavitary PNA  Assessment: Extensive L PNA with cavitations.  DDx includes hx of ETOH abuse with aspiration, CAP, less likely TB.  HIV negative 7/18.  Denies IV drug use.  PPD negative.  Plan: -Received 12 days of abx at this point and all abx have been d/ced, I would strongly recommend against stopping abx without re-imaging until there is documentation of closing of the cavity.  Would certainly repeat the CT of the chest without contrast now to see if cavitations are improving.  Would treat with at least 3 wks given cavitation.  If IV access is  an issue would treat with augmentin (cover strep pneumonia) and bactrim (since CA-MRSA are the most likely reason for cavitation with CAP). -No indication for bronchoscopy at this time, this is not a post obstructive type pneumonia. -If duration of treatment is an issue please call ID.  ETOH Hx Plan: -Monitor for w/d symptoms -Ativan judiciously and only for agitation. -Neuro consult noted.   Recommendations are to continue abx, cover for CA-MRSA, repeat imaging and treat for at least 3 wks or until cavity is closed.  Will continue to follow with you.  Alyson Reedy, M.D. [facility] Pulmonary/Critical Care Medicine. Pager: [phone]. After hours pager: [phone].

## 2012-03-12 NOTE — Progress Notes (Signed)
FMTS Attending Daily Note:  Renold Don MD  [phone] pager  Family Practice pager:  [phone] I have seen and examined this patient and have reviewed their chart. I have discussed this patient with the resident. I agree with the resident's findings, assessment and care plan.  Still with diarrhea.  No pain.  Hungry and eating well.   Exam:   Gen:  Cachectic CV:  Faint systolic murmur noted.  RRR Abdomen:  Soft/thin, nontender.   Overall she has been improving from admission but still several issues:   1)  Pneumonia:  Possible necrotizing pneumonia versus aspiration.   - Currently being treated with Zosyn and Bactrim as MRSA coverage, switched from Vanc secondary to rising creatinine.   - Question is best antibiotic coverage in light of kidney injury and for how long -- ID consult for further recommendations regarding this.  FU CT chest per pulm to ensure improvement.  2)  AKI:  Admit creatinine 1.3.  Decline to 0.5, now rising to 1.4 on today's labs.   - Several possible insults: dye with CT abdomen, started on Lisinopril last week, has been receiving Vancomycin and had high Vanc trough, profuse diarrhea leading to pre-renal deficits.  - On diuretic, so FeNa not helpful.  Holding all nephrotoxic drugs currently and continuing to hydrate.   - She has not had strict ins and outs thus far and weights have been scattered as she has moved to different hospital floors.  Starting strict I/Os today.   3)  Electrolyte imbalances:  Persistent hypokalemia and hypomagnesemia in known alcoholic patient.  - Likely gastrointestinal losses as she is still having diarrhea.   - Strict I/Os to measure exact output.  Negative C. Dif.   - Continue to replete 4) Protein-calorie malnutrition:  Known breast cancer survivor.  Metastasis versus recurrence always a concern.   - Negative CT scan for abdominal masses.   - Low albumin since May of this year, but normal December 2012.   - Start ensure to help with regainig  strength.   5)  ID:  PNA concerns as above.  - Persistent diarrhea:  Likely secondary to antibiotic usage as this seems to have worsened according to patient since admission to hospital.  I do not dropping weights since March of this year based on our clinic notes.   - Has grown few yeast from sputum culture, not a concern per pulm.  Also with abundant yeast in stool.  Question colonization versus infection that needs to be treated - question for ID.    Appreciate all consult help.   Tobey Grim, MD

## 2012-03-12 NOTE — Progress Notes (Signed)
I cosign for [employer] assessments and medications. Kathia Covington, Melida Quitter

## 2012-03-12 NOTE — Progress Notes (Signed)
Clinical Child psychotherapist (CSW) received pt pasarr number today. Pt to dc to [facility] when medically stable.  Theresia Bough, MSW, Theresia Majors [phone]

## 2012-03-13 ENCOUNTER — Inpatient Hospital Stay (HOSPITAL_COMMUNITY): Payer: MEDICAID

## 2012-03-13 DIAGNOSIS — J189 Pneumonia, unspecified organism: Secondary | ICD-10-CM

## 2012-03-13 LAB — URINALYSIS, ROUTINE W REFLEX MICROSCOPIC
Bilirubin Urine: NEGATIVE
Hgb urine dipstick: NEGATIVE
Nitrite: NEGATIVE
Protein, ur: NEGATIVE mg/dL
Specific Gravity, Urine: 1.01 (ref 1.005–1.030)
Urobilinogen, UA: 0.2 mg/dL (ref 0.0–1.0)

## 2012-03-13 LAB — STOOL CULTURE

## 2012-03-13 LAB — CBC
Hemoglobin: 8.6 g/dL — ABNORMAL LOW (ref 12.0–15.0)
Platelets: 315 10*3/uL (ref 150–400)
RBC: 2.66 MIL/uL — ABNORMAL LOW (ref 3.87–5.11)

## 2012-03-13 LAB — BASIC METABOLIC PANEL
Chloride: 105 mEq/L (ref 96–112)
GFR calc Af Amer: 46 mL/min — ABNORMAL LOW (ref >90)
GFR calc non Af Amer: 40 mL/min — ABNORMAL LOW (ref >90)
Potassium: 5.5 mEq/L — ABNORMAL HIGH (ref 3.5–5.1)
Sodium: 138 mEq/L (ref 135–145)

## 2012-03-13 LAB — PROTIME-INR
INR: 1.15 (ref 0.00–1.49)
Prothrombin Time: 14.9 seconds (ref 11.6–15.2)

## 2012-03-13 LAB — MAGNESIUM: Magnesium: 2.2 mg/dL (ref 1.5–2.5)

## 2012-03-13 LAB — VANCOMYCIN, RANDOM: Vancomycin Rm: 14 ug/mL

## 2012-03-13 MED ORDER — LORAZEPAM BOLUS VIA INFUSION: 0.5000 mg | Freq: Three times a day (TID) | INTRAVENOUS | Status: DC | PRN

## 2012-03-13 MED ORDER — LORAZEPAM 0.5 MG PO TABS: 0.5000 mg | ORAL_TABLET | Freq: Three times a day (TID) | ORAL | Status: DC | PRN

## 2012-03-13 MED ORDER — LOPERAMIDE HCL 2 MG PO CAPS
2.0000 mg | ORAL_CAPSULE | ORAL | Status: DC | PRN
  Administered 2012-03-13: 2 mg via ORAL
  Filled 2012-03-13: qty 1

## 2012-03-13 MED ORDER — CLOTRIMAZOLE 1 % VA CREA
1.0000 | TOPICAL_CREAM | Freq: Two times a day (BID) | VAGINAL | Status: DC | PRN
  Administered 2012-03-13: 1 via VAGINAL
  Filled 2012-03-13: qty 45

## 2012-03-13 MED ORDER — LORAZEPAM 0.5 MG PO TABS
0.5000 mg | ORAL_TABLET | Freq: Three times a day (TID) | ORAL | Status: DC | PRN
  Administered 2012-03-13: 0.5 mg via ORAL
  Filled 2012-03-13: qty 1

## 2012-03-13 MED ORDER — LORAZEPAM 2 MG/ML IJ SOLN
0.5000 mg | Freq: Three times a day (TID) | INTRAMUSCULAR | Status: DC | PRN
  Administered 2012-03-13: 0.5 mg via INTRAVENOUS
  Filled 2012-03-13: qty 1

## 2012-03-13 NOTE — Progress Notes (Signed)
INFECTIOUS DISEASE PROGRESS NOTE  ID: KAMEAH RAWL is a 51 y.o. female with  Hx of heavy alcohol use presented with extensive left lung consolidation with evidence of microcavitation. She does seem to be improving on broad empiric therapy. She was only able to bring up sputum one week into illness at that point the specimen showed no organisms on stain. Received 14 days of vanco and piptazo now developing aki   Subjective: Afebrile. Improving shortness of breath    Abtx: day 14 amox/clav Day #1 vanco for 13 days piptazo for 13 days   Medications:     . amLODipine  10 mg Oral Daily  . amoxicillin-clavulanate  1 tablet Oral BID AC  . feeding supplement  237 mL Oral TID BM  . folic acid  1 mg Oral Daily  . LORazepam  0.25 mg Oral BID  . metoprolol tartrate  25 mg Oral BID  . multivitamin with minerals  1 tablet Oral Daily  . pantoprazole  40 mg Oral Q1200  . sodium chloride  3 mL Intravenous Q12H  . thiamine  100 mg Oral Daily   Or  . thiamine  100 mg Intravenous Daily  . tuberculin  5 Units Intradermal Once  . venlafaxine XR  150 mg Oral Daily  . DISCONTD: potassium chloride  60 mEq Oral BID    Objective: Vital signs in last 24 hours: Temp:  [97.7 F (36.5 C)-98.5 F (36.9 C)] 97.8 F (36.6 C) (07/31 1437) Pulse Rate:  [81-104] 81  (07/31 1437) Resp:  [20] 20  (07/31 1437) BP: (100-156)/(77-109) 141/94 mmHg (07/31 1437) SpO2:  [97 %-100 %] 100 % (07/31 1437) Weight:  [94 lb 12.8 oz (43 kg)] 94 lb 12.8 oz (43 kg) (07/31 0545)  General: Thin, alert and in no distress  Skin: No rash  Lungs: She has crackles in the left side posteriorly with E to A changes of consolidation  Cor: Regular S1 and S2 with no murmurs  Abdomen: Nontender   Lab Results  Basename 03/13/12 0500 03/12/12 0555  WBC 10.6* 11.4*  HGB 8.6* 8.3*  HCT 26.1* 24.6*  NA 138 141  K 5.5* 4.8  CL 105 105  CO2 21 24  BUN 9 8  CREATININE 1.51* 1.45*  GLU -- --   Liver Panel  Basename  03/11/12 0630  PROT 7.2  ALBUMIN 1.5*  AST 18  ALT 8  ALKPHOS 79  BILITOT 0.9  BILIDIR --  IBILI --   Sedimentation Rate No results found for this basename: ESRSEDRATE in the last 72 hours C-Reactive Protein No results found for this basename: CRP:2 in the last 72 hours  Microbiology: Recent Results (from the past 240 hour(s))  CULTURE, RESPIRATORY     Status: Normal   Collection Time   03/07/12  4:02 PM      Component Value Range Status Comment   Specimen Description TRACHEAL ASPIRATE   Final    Special Requests NONE   Final    Gram Stain     Final    Value: MODERATE WBC PRESENT,BOTH PMN AND MONONUCLEAR     MODERATE SQUAMOUS EPITHELIAL CELLS PRESENT     FEW YEAST   Culture FEW YEAST CONSISTENT WITH CANDIDA SPECIES   Final    Report Status 03/10/2012 FINAL   Final   CLOSTRIDIUM DIFFICILE BY PCR     Status: Normal   Collection Time   03/10/12  4:19 AM      Component Value Range Status Comment  C difficile by pcr NEGATIVE  NEGATIVE Final   STOOL CULTURE     Status: Normal   Collection Time   03/10/12  4:19 AM      Component Value Range Status Comment   Specimen Description STOOL   Final    Special Requests NONE   Final    Culture     Final    Value: ABUNDANT YEAST     NO SALMONELLA, SHIGELLA, CAMPYLOBACTER, YERSINIA, OR E.COLI 0157:H7 ISOLATED     Note: REDUCED NORMAL FLORA PRESENT   Report Status 03/13/2012 FINAL   Final   CULTURE, BLOOD (ROUTINE X 2)     Status: Normal (Preliminary result)   Collection Time   03/10/12  1:00 PM      Component Value Range Status Comment   Specimen Description BLOOD LEFT ARM   Final    Special Requests BOTTLES DRAWN AEROBIC AND ANAEROBIC 10CC   Final    Culture  Setup Time 03/10/2012 18:09   Final    Culture     Final    Value:        BLOOD CULTURE RECEIVED NO GROWTH TO DATE CULTURE WILL BE HELD FOR 5 DAYS BEFORE ISSUING A FINAL NEGATIVE REPORT   Report Status PENDING   Incomplete   CULTURE, BLOOD (ROUTINE X 2)     Status: Normal  (Preliminary result)   Collection Time   03/10/12  1:11 PM      Component Value Range Status Comment   Specimen Description BLOOD LEFT ARM   Final    Special Requests BOTTLES DRAWN AEROBIC AND ANAEROBIC 10CC   Final    Culture  Setup Time 03/10/2012 18:09   Final    Culture     Final    Value:        BLOOD CULTURE RECEIVED NO GROWTH TO DATE CULTURE WILL BE HELD FOR 5 DAYS BEFORE ISSUING A FINAL NEGATIVE REPORT   Report Status PENDING   Incomplete   URINE CULTURE     Status: Normal   Collection Time   03/11/12  2:38 PM      Component Value Range Status Comment   Specimen Description URINE, CATHETERIZED   Final    Special Requests PATIENT ON FOLLOWING ZOSYN,VANCOMYCIN   Final    Culture  Setup Time 03/11/2012 15:25   Final    Colony Count NO GROWTH   Final    Culture NO GROWTH   Final    Report Status 03/12/2012 FINAL   Final     Studies/Results: US Renal  03/13/2012  *RADIOLOGY REPORT*  Clinical Data: Elevated creatinine.  RENAL/URINARY TRACT ULTRASOUND COMPLETE  Comparison:  MRI 03/01/2012  Findings:  Right Kidney:  12.3 cm.  Slightly increased echotexture.  No focal abnormality or hydronephrosis.  Left Kidney:  12.7 cm.  Slightly increased echotexture.  No focal abnormality or hydronephrosis.  Bladder:  Normal appearance.  IMPRESSION: Slight increased echotexture within the kidneys bilaterally.  This can be seen with chronic medical renal disease.  No hydronephrosis.  Original Report Authenticated By: Cyndie Chime, M.D.     Assessment/Plan: 1. Continue antibiotic therapy with oral amoxicillin clavulanate 500 mg twice daily for at least 3 weeks. recommend repeat cxr in 2-3 weeks to decide if need to continue antibiotics as an outpatient.    Will sign off. Thank you for consultation. Please call back if any further questions  Dontavius Keim Infectious Diseases 03/13/2012, 2:52 PM [phone]

## 2012-03-13 NOTE — Progress Notes (Signed)
FPTS PGY-1 Update Note  Pt seen at bedside around 1445, sister present in room. Paged by nursing due to pt being agitated, trying to get out of bed, picking at arms, and being generally disoriented with some visual hallucinations. Per sister, pt is not anywhere near baseline and the only times she has acted this confused/disoriented is when she has been drinking heavily. The person pt has been seeing/trying to talk to is (per sister) their stepfather, who has been dead 10-15 years.  On exam, pt is oriented to self but not place or time (states she is at an address in [place] identified by sister as a home they previously lived in; identifies date as "tenth or twelfth of 2013"). Appears relatively calm at the moment, but does occasionally pick at skin on arms. No apparent distress, sitting at bedside commode with nursing assistance.  Extensively discussed current therapy with sister and answered many questions, particularly about pneumonia, kidney function, and mental status. Plan as below; also see progress notes from myself and Dr. Gwendolyn Grant from earlier. Discussed current updates with Dr. Gwendolyn Grant and the rest of the FPTS team, as well.  1. Continue Augmentin for pneumonia. Monitor VS for fevers, increased BP, tachycardia. Also monitor for any increased O2 need.  2. Continue IV hydration, monitor kidney function. Avoid nephrotoxins. Strict I&O, bladder scan this afternoon post-void, may start foley catheter.  3. Will continue to monitor mental status; appears to be delerium, with uncertain cause (?baseline dementia worsened by acute improving illness(es), s/p fall last Friday, etc). Sitter at bedside for the time being, with Ativan 0.5 mg q8h PRN; would prefer low-dose Haldol (or no meds at all), but pt has QTc of 450. Will attempt to normalize pt atmosphere as much as possible as quickly as possible (i.e., stopping IV fluids when able, d/cing telemetry when safe, and so on).  Bobbye Morton,  MD PGY-1, [facility] Family Medicine FPTS Intern pager: [phone]

## 2012-03-13 NOTE — Progress Notes (Signed)
Family Medicine Teaching Service Daily Progress Note Intern Pager: [phone]  Patient name: [name] Medical record number: [id number] Date of birth: 1960/10/24 Age: 51 y.o. Gender: female  Primary Care Provider: MATTHEWS,CODY, DO  Subjective: Pt seen at bedside this morning. Awake, alert this morning, oriented to self and time, some slight confusion to place. States she feels better, breathing and pain is better, appetite is coming back. No complaints or questions, this morning.  Per nursing: Confusion to place as above. Pt with some visual hallucinations (sees people in room, states things like, "What's wrong with him?" and points), trying to stand up, general agitation. Did have some urinary retention this morning with 1000 mL return on in-and-out cath.  Objective: Temp:  [97.7 F (36.5 C)-98.5 F (36.9 C)] 97.7 F (36.5 C) (07/31 0545) Pulse Rate:  [86-104] 89  (07/31 0950) Resp:  [20] 20  (07/31 0545) BP: (100-156)/(77-109) 145/90 mmHg (07/31 0949) SpO2:  [97 %-100 %] 100 % (07/31 0545) Weight:  [94 lb 12.8 oz (43 kg)] 94 lb 12.8 oz (43 kg) (07/31 0545) Exam: General: Pt alert and oriented, speech easier to understand this morning, smiling, asking and answering questions appropriately Cardiovascular: RRR, no murmur appreciated Respiratory: inspiratory effort much better than previous days; no increased work of breathing, right lung clear to auscultation, left lung with scattered coarse crackles worse in upper fields, but overall improving Abdomen: soft, nontender, non-distended, BS+ Extremities: occasional coarse intention tremor, mostly in arms, not present at rest, not as bad as previous days; otherwise moves all extremities equally, distal pulses 2+, intact/symmetric  Laboratory:  Lab 03/13/12 0500 03/12/12 0555 03/11/12 0630  WBC 10.6* 11.4* 14.3*  HGB 8.6* 8.3* 8.6*  HCT 26.1* 24.6* 25.4*  PLT 315 287 327    Lab 03/13/12 0500 03/12/12 0555 03/11/12 0630  NA 138  141 143  K 5.5* 4.8 4.3  CL 105 105 104  CO2 21 24 27   BUN 9 8 5*  CREATININE 1.51* 1.45* 1.31*  CALCIUM 8.9 8.2* 8.3*  PROT -- -- 7.2  BILITOT -- -- 0.9  ALKPHOS -- -- 79  ALT -- -- 8  AST -- -- 18  GLUCOSE 88 78 87    Lab 03/13/12 0500  INR 1.15   HIV: nonreactive, 7/18 Fecal occult blood:  Negative on 7/20  POSITIVE on 7/23  Imaging/Diagnostic Tests: CXR, 7/18 @1424  IMPRESSION:  Dense pneumonia in the left lung, probably the left upper lobe.  CT, 7/18 @2028  IMPRESSION:  Dense left upper lobe and superior segment left lower lobe  consolidation, most compatible with pneumonia. Collapse with  proximal endobronchial lesion is felt less likely.  Gallstones with possible choledocholithiasis or volume averaging  from pancreatic calcification. Consider MRCP for further  evaluation.  Hepatic steatosis.  Trace upper abdominal ascites.  MRCP, 7/19 @1243  IMPRESSION:  1. Moderate to severely degraded exam, secondary to motion and  inability to hold breath.  2. No biliary ductal dilatation. Distal common duct findings,  which when combined with CT findings, are most consistent with  small distal common duct stones. Concurrent cholelithiasis, as on CT.  3. Findings of chronic pancreatitis, without definite acute  superimposed process.  4. Marked hepatic steatosis.  5. Left base air space disease and adjacent fluid, as before.  6. Renal and perirenal edema with suggestion of decreased contrast  excretion on delayed images. Question acute or subacute renal  Insufficiency.  CXR, 7/21 IMPRESSION:  1. Significantly worsening aeration throughout the left lung,  compatible with  progressive multilobar pneumonia.  2. New small bilateral pleural effusions.  3. Atherosclerosis.  CT Chest, 7/23 @1520  IMPRESSION:  1. Progressive multifocal pneumonia on the left with  consolidation, cavitation and lobar expansion. These features are  most suggestive of infection with a  gram-negative organism  (Klebsiella, E Coli or Pseudomonas).  2. Associated small right greater than left pleural effusions and  probable reactive mediastinal lymphadenopathy.  CT Head, 7/26 @1331  IMPRESSION:  1. No acute intracranial abnormality. No acute traumatic injury  identified.  2. Age advanced global atrophy.  CXR, 7/28 @1402  IMPRESSION:  Right lung is clear. Persistent consolidation with air bronchogram  and cavitation in the left upper lobe consistent with pneumonia.  Slight improvement in aeration left lower lobe.  Renal US, 7/31 @1116  IMPRESSION:  Slight increased echotexture within the kidneys bilaterally. This  can be seen with chronic medical renal disease. No hydronephrosis.  Assessment/Plan: [name] is a 51yo female with PMH significant for HTN, EtOH abuse/alcoholic hepatitis, and breast CA (2001, s/p lumpectomy, chemo, and radiation therapy) presenting with inability to tolerate PO with shortness of breath. Found on admission to have significant LUL pneumonia; also with multiple interrelated issues (HTN/tachycardia, electrolyte abnormalities, acute kidney injury). Admitted from clinic on 7/18.   1. Shortness of breath/Cough: CXR/CT findings PNA of LUL with cavitation. DDx also included PE, but less likely. Initially thought to be related to aspiration due to hx of alcoholic abuse, but this is less clear, now. Subjective fevers at home, none here so far this admission. TB considered in DDx, PPD placed 7/20, negative on 7/22. Vancomycin added 7/21 as pt was not improving on Zosyn + azithromycin. Pulmonology (Dr. Molli Knock) was consulted 7/25 for evidence of cavitary PNA with little clinical improvement. Respiratory culture shows only few yeast, compatible with Candida, ?contaminant. Vancomycin discontinued 7/29 due to kidney function, switched to Bactrim for MRSA coverage. Abx discontinued 7/30 and ID consulted, recommending Augmentin 500-125 BID for three weeks of total  therapy. Lung exams stable, continue to slowly improve. WBC continue to trend down. PLAN -   Continue Augmentin BID (last day of dosing 8/8) for 3 weeks of total therapy.    Pt received abx for 12 total days, MRSA coverage for 10   Zosyn for 12, Azithro for 3, Vanc for 9, Bactrim for 1).  Will consider repeat CT chest.   Will monitor closely for increased O2 need, fevers, etc. Supplemental O2 as needed.   Incentive spirometry.   Appreciate all consult recommendations/assistance.  2. Mental status/tremor - Waxing and waning confusion/reduced responsiveness, coarse tremor on exam especially with movement, and global atrophy on CT 7/26 s/p fall. Also with new complaint of diarrhea, per nursing. Given history of heavy alcohol use and little apparent clinical improvement of pt's pneumonia in addition to the above symptoms, neurology has been consulted, 7/28 (Dr. Lu Duffel). CXR improved on 7/28 improved slightly from previous days. EKG without changed, lactic acid slightly increased, ammonia normal. UA unremarkable; negative but pt has been on abx for multiple days.  Blood cultures show no growth to date and Legionella negative. 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 Ativan scheduled has been discontinued as of 7/30. Mental status still with some slight waxing and waning but overall much improved. Remains 7/31 waxing/waning, with some agitation, trying to get out of bed, and reported visual hallucinations, per nursing staff this afternoon. PLAN - Would avoid Haldol (last QTc ~460), but may order Ativan 0.5 mg PRN q8 for agitation  per neuro recs; would avoid meds completely if possible. Sitter in room, as pt is a fall risk, as well. Neuro recommendations appreciated.   3. Acute kidney injury - Pt very thin with little muscle mass, with initial Cr of 1.13, trended down to 0.47 with IVF and improved hydration. Slow increase since that time, to 1.45 on 7/30. Etiology unclear,  probably multifactorial; pt has been on vancomycin (7/21-7/29) and Bactrim (7/29-7/30), lisinopril restarted 7/25 and dosage increased 7/28. Pt also complaining of intermittent diarrhea and on diuretic (HCTZ) with generally poor PO intake. Nephrotoxic drugs d/c'd and held on 7/30. Nephrology contacted 7/31 (Dr. Eliott Nine), recommended random vanc level to see if med is still present, as well as checking renal US, which shows mild increased echogenicity; no apparent evidence of glomerulonephropathy. Cr mildly elevated 7/30 --> 7/31, now 1.51; rate of increase appears to be slowing. PLAN - Continue to avoid nephrotoxins and monitor Cr. Will consider more formal renal consult if Cr does not begin to improve. Hold oral K today. Will likely recommend checking Cr 1 week after discharge.  4. Urinary retention - New on 7/31, pt with 1000 mL on in-and-out cath and 830 mL average post-void residual for the prior three voids. Uncertain etiology; pt does not appear to be on any anticholinergic medications. UA on 7/28 and on 7/31 unremarkable, pt has been on extensive abx course for PNA, so UTI seems less unlikely. No evidence of urinary obstruction on renal US. PLAN - Recheck post-void residual this afternoon, 7/31. May consider foley catheter if retention continues.  5. HTN: Slightly low BP on admission, anti-hypertensives were held, then intermittent elevated pressures. Home HCTZ restarted on 7/21, restarted lisinopril 7/25; lisinopril increased to 40 mg daily on 7/27. Lisinopril stopped 7/29 due to elevated creatinine, switched to Norvasc 10. BP remains elevated but improved, with tachycardia now only intermittently. PLAN - Continue Norvasc 10 BID, started 7/29. Started metoprolol 25 mg BID on 7/30. Hydralazine PRN for BP >180/100.  6. Anemia - Most likely mixed vitamin/iron deficiencies secondary to EtOH abuse and general poor PO intake; MCV near upper limit of normal with one measurement 101.6, and iron panel on 7/21  shows low iron and TIBC but elevation of ferritin; pt likely with iron metabolism derangement secondary to alcoholism. Hb 11.0 in April 2013, 8.5 on admission this time. Trended down to 6.9 on 7/19. Now 7.6 on 7/20. Oral iron started 7/21, but discontinued 7/23; we do not want to iron overload her.  Stool initially were hemoccult negative, no bloody emesis. Stool hemoccult positive on 7/23. Hb 5.6, pt transfused 2 units on 7/24 with Hb rise to 9.5. Hb with slight slow drift down again, 7/26 9.0 --> 7/27 8.7 --> 7/28 8.5. Stable 7/29 at 8.6, 7/30 at 8.3, 7/31 at 8.6. PLAN - Will continue to monitor CBC's. Drift may be at least partially due to extensive lab draws. Continue multivitamin, folate, thiamine. May refer pt for outpt GI follow-up vs re-consult during this admission, per Dr. Marzetta Board note on 7/24.  7. Tachycardia: Initially believed likely secondary to dehydration given hx of inability to tolerate PO, no evidence of PE since admission. Also considered possible EtOH withdrawal and/or component of anemia, as above. Given elevated BP, DDx could also include more exotic causes such as carcinoid, pheochromocytoma, etc. Intermittent pulse rate in normal range around 80-90's, now more steady in normal range after adding metoprolol 7/30. Monitor with other signs/symptoms.  8. Abdominal pain/anorexia: Patient with history of EtOH abuse and EtOH  induced hepatitis, initially presented with abdominal pain, icterus and inability to tolerate PO. Broad differential including pancreatitis, hepatitis, malignancy, gallstones, gastritis. Admitted to telemetry and for hydration. CT shows LUL PNA, gallstones with possible choledocolithiasis and hepatic steatosis with trace upper abd ascites. MRCP on 7/19 is a generally poor study, shows current cholelithiasis and chronic pancreatitis and nonspecific distal common duct findings. Abdominal pain appears resolved; some complaints diarrhea intermittently, appears improved as of  7/31. C.diff negative 7/28. Stool cultures show yeast and reduced normal flora. PLAN - Immodium PRN. Will monitor clinically; hopefully will improve as abx come off. Will touch base with ID about yeast on stool culture. Protonix daily. Cont. Zofran PRN for nausea. GI outpt follow-up vs re-consult, as above.  9. EtOH abuse: Last drink reported >3 days prior to admission, deemed unlikely to withdraw if she has truly not had anything to drink more recently. CIWA protocol initiated due to clinical appearance, requiring intermittent dosing 7/19-7/27. Pt was started on scheduled Ativan on 7/22. CIWA last renewed 7/24; pt received 4 doses on 7/26-7/27 overnight, probably inappropriately as pt is well outside the window for EtOH withdrawal and has had tremor, tachycardia, and elevated BP since admission; CIWA discontinued 7/27. PLAN - Continue to avoid sedating meds, but will address agitation as appropriate with PRNs. Multivitamin, folic acid, thiamine.  10. Depression: Has expressed passive SI in the past as well as in clinic on 7/18. No plan vocalized. Will continue home SSRI and monitor closely. Possible contribution to elevated BP by Effexor; may consider changing.  11. PT/OT - Updated notes reflect recommendation for SNF placement.CSW actively involved and pt agreeable. Assistance and recommendations from all sides is appreciated!  FEN/GI:   Dys 3 diet  NS @ 75 mL/hr. MVI with thiamine and folic acid.  Multiple K and Mg replenishments throughout stay; will continue as appropriate.   K: 5.5; holding oral replacement today   Mg: 2.2  PPX: Lovenox d/ced 7/23 as FOB is positive, SCD's only for now; Protonix.  Dispo: Pending clinical improvementely to SNF  Bobbye Morton, MD PGY-1, [facility] Family Medicine FPTS Intern pager: [phone]

## 2012-03-13 NOTE — Plan of Care (Signed)
Problem: Phase III Progression Outcomes Goal: Activity at appropriate level-compared to baseline (UP IN CHAIR FOR HEMODIALYSIS)  Outcome: Not Met (add Reason) Sister says pt is no where near baseline neuro wise . Only other time she saw her like this is when she was drinking and had to go to rehab 2 or 3 times. Marisa Cyphers RN

## 2012-03-13 NOTE — Progress Notes (Signed)
Patient ID: [name], female   DOB: 07/31/1961, 51 y.o.   MRN: [id number] FMTS Attending Daily Note:  Renold Don MD  [phone] pager  Family Practice pager:  [phone] I have seen and examined this patient and have reviewed their chart. I have discussed this patient with the resident. I agree with the resident's findings, assessment and care plan.  Plan read but still labeled incomplete by resident.    Additionally: *Late note: I examined [name] around 1 pm.  She was sitting on the side of her bed eating her lunch.  She told me that she had been washing her clothes and apologized for the state of her room, stating that her folded clothes were everywhere.  There were no clothes in the room and she told me she had been "down on [employer]" doing her laundry just moments earlier.  Otherwise, she had no complaints, told me she was feeling much better than she had been.  Exam: Gen:  Patient eating, no acute distress, very thin. Heart:  RRR with faint systolic murmur noted Lungs:  Coarse crackles and bronchial breath sounds on Left, worse at base.  Faint crackles on right.  No increased inspiratory effort.   Abdomen:  Thin/soft/nontender Neuro:  Alert and oriented only to person. Could not tell me her age, what year it is, nor her birthdate.  Not oriented to place.  Coarse intention tremor present.  Strength 4/5 all extremities, sensation 5/5 all extremities.   Impression/Plan: 1.  PNA:  Improving.  Continue Augmentin per ID.  Will repeat CT scan to assess for closing of cavitation, though not necessarily needed inpatient as we will continue antibiotics for full 3 weeks of therapy.  Afebrile, white count continually decreasing.   2.  Altered mental status:  Patient has had somewhat strange affect throughout entire hospital course that I have seen her.  She does seem to have suffered an abrupt change today -- on one hand she is not mumbling and her conversation is more lucid, on the other she  seems less oriented than usual.  Favor acute delirium from EtOH withdrawal, continue CIWA. 3.  AKI:  Creatinine continues to rise, although at less rapid rate.  Holding nephrotoxics, strict I/O's, renal ultrasound.   4. Dispo:  Will continue to monitor as inpatient until AKI and delirium resolve.

## 2012-03-13 NOTE — Progress Notes (Signed)
Text page to MD received immediate call. Made aware of pt's condition of agitation increase , continually trying to get out of bed.  Staff taking turns sitting with pt now for over an hour. Pt has bed alarm on and in camera room. Orders received. Unable to reorient pt. She is having hallucinations of dfiferent people being in the room and constant talking about having to get up and clean the floor and get " out the door " T Artie Takayama RN

## 2012-03-13 NOTE — Progress Notes (Signed)
Please see my note for today for further details.

## 2012-03-13 NOTE — Progress Notes (Signed)
Text page MD notified of post void residual of 640 cc. Order received to place indwelling foley catheter. Marisa Cyphers RN

## 2012-03-13 NOTE — Progress Notes (Signed)
Pt has urgency tries to get up frequently to void than voids small amts of 50 cc or less. Bladder scan done x 3 - average of 3 is 820 cc. I and O cath done 1000cc returned of clear yellow urine. Text paged family medicine to make aware of 1000 cc urine and bladder scan results. Marisa Cyphers RN

## 2012-03-13 NOTE — Progress Notes (Signed)
Name: [name] MRN: [id number] DOB: [birth year]    LOS: 13  Referring MD:  [facility] FPTS Reason for Consult: Multi-focal PNA    [facility]   History of Present Illness:  51 y/o F with PMH of HTN, ETOH abuse, ETOH hepatitis, breast cancer s/p lumpectomy, chemo/XRT admitted on 7/18 with a one week hx of increasing SOB, productive cough and decreased PO intake with n/v.  Also notes diarrhea.  Work up demonstrated cxr with dense PNA in LUL. Further evaluated with CT chest on 7/23 with dense L PNA and cavitation.  7/24 PCCM consulted for multifocal PNA.   Cultures: Sputum>>>Pending  Antibiotics: 7/19 Azithro>>> 7/18 Zosyn>>> 7/21 Vacno>>>  Tests / Events: 7/23 CT Chest>>>Progressive multifocal pneumonia on the left with consolidation, cavitation and lobar expansion. These features are most suggestive of infection with a gram-negative organism (Klebsiella, E Coli or Pseudomonas).  Associated small right greater than left pleural effusions and probable reactive mediastinal lymphadenopathy.  Subjective: Denies acute complaints.  Indicates dry cough - no sputum production.   Vital Signs: Temp:  [97.7 F (36.5 C)-98.8 F (37.1 C)] 97.7 F (36.5 C) (07/31 0545) Pulse Rate:  [86-111] 86  (07/31 0545) Resp:  [20] 20  (07/31 0545) BP: (100-156)/(77-109) 156/109 mmHg (07/31 0545) SpO2:  [97 %-100 %] 100 % (07/31 0545) Weight:  [43 kg (94 lb 12.8 oz)] 43 kg (94 lb 12.8 oz) (07/31 0545) I/O last 3 completed shifts: In: 3006 [P.O.:1300; I.V.:1606; IV Piggyback:100] Out: 951 [Urine:950; Stool:1]  Physical Examination: General: thin adult female in NAD, temporal wasting noted Neuro: awakens, more alert / interactive CV: s1s2 rrr, no m/r/g PULM: resp's even/non-labored, left coarse rhonchi, no accessory muscle  Use  GI: flat, soft, bsx4 active Extremities: warm/dry, no edema  Labs    CBC  Lab 03/13/12 0500 03/12/12 0555 03/11/12 0630  HGB 8.6* 8.3* 8.6*  HCT 26.1* 24.6*  25.4*  WBC 10.6* 11.4* 14.3*  PLT 315 287 327   BMET  Lab 03/13/12 0500 03/12/12 0555 03/11/12 0630 03/10/12 0540 03/09/12 0305 03/07/12 1626  NA 138 141 143 143 141 --  K 5.5* 4.8 -- -- -- --  CL 105 105 104 106 107 --  CO2 21 24 27 26 26  --  GLUCOSE 88 78 87 82 116* --  BUN 9 8 5* 5* 4* --  CREATININE 1.51* 1.45* 1.31* 1.15* 0.93 --  CALCIUM 8.9 8.2* 8.3* 8.3* 8.0* --  MG 2.2 0.9* 1.0* -- -- 2.3  PHOS -- -- -- -- -- --     Lab 03/13/12 0500  INR 1.15    No results found for this basename: PHART:5,PCO2:5,PCO2ART:5,PO2:5,PO2ART:5,HCO3:5,TCO2:5,O2SAT:5 in the last 168 hours   Radiology: 7/21 CXR>>>Significantly worsening aeration throughout the left lung, compatible with progressive multilobar pneumonia. . New small bilateral pleural effusions.  Atherosclerosis  7/23 CT Chest>>>Progressive multifocal pneumonia on the left with consolidation, cavitation and lobar expansion. These features are most suggestive of infection with a gram-negative organism (Klebsiella, E Coli or Pseudomonas).  Associated small right greater than left pleural effusions and probable reactive mediastinal lymphadenopathy.  Assessment and Plan: Principal Problem:  *Necrotizing pneumonia Active Problems:  Cholelithiasis  Abnormal finding on GI tract imaging  Hypoxemia  Alcohol withdrawal  Altered mental status  Left cavitary PNA  Assessment: Extensive L PNA with cavitations.  DDx includes hx of ETOH abuse with aspiration, CAP, less likely TB.  HIV negative 7/18.  Denies IV drug use.  PPD negative.    Plan: -continue empiric  Augmentin as recommended by Infectious disease. Would  Not stop antibiotics until there is documentation of closing of the cavity.  Would certainly repeat the CT of the chest without contrast now to see if cavitations are improving.  Would treat with at least 3 wks given cavitation. Call us PRN for further assistance.    PCCM will sign off, please call back if needed.  Patient  seen and examined, agree with above note.  I dictated the care and orders written for this patient under my direction.  Koren Bound, M.D. [phone]

## 2012-03-13 NOTE — Progress Notes (Signed)
Staff present with pt at bedside for 2 hr. MD ordered sitter for safety. Sitter here now. Pt has not voided since 1130 10000  cc I & O cath. Will continue to monitor per protocol. When & if pt voids within specified time will monitor post void residual using bladder scan.

## 2012-03-13 NOTE — Discharge Summary (Signed)
Family Medicine [facility] Discharge Summary  Patient name: [name] Medical record number: [id number] Date of birth: 10/05/1960 Age: 51 y.o. Gender: female Date of Admission: 02/29/2012  Date of Discharge: 03/17/2012 Admitting Physician: Barbaraann Barthel, MD Discharging Physician: Tobey Grim, MD  Primary Care Provider: MATTHEWS,CODY, DO  Indication for Hospitalization: shortness of breath and abdominal pain Discharge Diagnoses:  Primary Diagnosis: 1. Aspiration pneumonia  Secondary diagnoses: 2. 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 Acute Kidney Injury 4. Urinary retention 5. HTN 6. Anemia 7. Tachycardia 8. Abdominal pain/anorexia 9. EtOH abuse 10. Depression 11. Hypokalemia  Consultations:  GI, Pulmonary, Neurology, Infectious Diseases, Renal, Urology  Significant Labs and Imaging:  7/18 2-view CXR: Dense pneumonia in the left lung, probably the left upper lobe.  7/18 CT abdomen/pelvis with contrast - IMPRESSION:  Dense left upper lobe and superior segment left lower lobe  consolidation, most compatible with pneumonia. Collapse with  proximal endobronchial lesion is felt less likely.  Gallstones with possible choledocholithiasis or volume averaging  from pancreatic calcification. Consider MRCP for further  evaluation.  Hepatic steatosis.  Trace upper abdominal ascites.  7/19 MRI abdomen with/without contrast including MRCP: IMPRESSION:  1. Moderate to severely degraded exam, secondary to motion and  inability to hold breath.  2. No biliary ductal dilatation. Distal common duct findings,  which when combined with CT findings, are most consistent with  small distal common duct stones. Concurrent cholelithiasis, as on  CT.  3. Findings of chronic pancreatitis, without definite acute  superimposed process.  4. Marked hepatic steatosis.  5. Left base air space disease and adjacent fluid, as before.  6. Renal and perirenal  edema with suggestion of decreased contrast  excretion on delayed images. Question acute or subacute renal  insufficiency.  7/21 CXR 2view: IMPRESSION:  1. Significantly worsening aeration throughout the left lung,  compatible with progressive multilobar pneumonia.  2. New small bilateral pleural effusions.  3. Atherosclerosis.]  7/23 CT chest w/contrast: IMPRESSION:  1. Progressive multifocal pneumonia on the left with  consolidation, cavitation and lobar expansion. These features are  most suggestive of infection with a gram-negative organism  (Klebsiella, E Coli or Pseudomonas).  2. Associated small right greater than left pleural effusions and  probable reactive mediastinal lymphadenopathy.  7/26 CT Head without contrast - IMPRESSION:  1. No acute intracranial abnormality. No acute traumatic injury  identified.  2. Age advanced global atrophy.  7/27 portable CXR: IMPRESSION:  Right arm PICC line with tip in right atrium. For a distal SVC  position PICC line should be retracted about 1 cm. Right lung is  clear. Again noted consolidation air bronchogram and mild  cavitation left lung consistent with multifocal pneumonia.  Probable small bilateral pleural effusion.  7/28 2view CXR: IMPRESSION:  Right lung is clear. Persistent consolidation with air bronchogram  and cavitation in the left upper lobe consistent with pneumonia.  Slight improvement in aeration left lower lobe.  7/31 Renal US: IMPRESSION:  Slight increased echotexture within the kidneys bilaterally. This  can be seen with chronic medical renal disease. No hydronephrosis.  CBC    Component Value Date/Time   WBC 7.8 03/17/2012 0500   WBC 5.5 11/23/2008 1555   RBC 2.20* 03/17/2012 0500   RBC 3.63* 11/23/2008 1555   HGB 7.4* 03/17/2012 0500   HGB 11.9 11/23/2008 1555   HCT 22.0* 03/17/2012 0500   HCT 35.4 11/23/2008 1555   PLT 286 03/17/2012 0500   PLT 172 11/23/2008 1555  MCV 100.0 03/17/2012 0500   MCV 97.5 11/23/2008  1555   MCH 33.6 03/17/2012 0500   MCH 32.9 11/23/2008 1555   MCHC 33.6 03/17/2012 0500   MCHC 33.8 11/23/2008 1555   RDW 15.5 03/17/2012 0500   RDW 22.2* 11/23/2008 1555   LYMPHSABS 1.2 03/11/2012 0630   LYMPHSABS 1.6 11/23/2008 1555   MONOABS 0.7 03/11/2012 0630   MONOABS 0.2 11/23/2008 1555   EOSABS 0.1 03/11/2012 0630   EOSABS 0.0 11/23/2008 1555   BASOSABS 0.1 03/11/2012 0630   BASOSABS 0.1 11/23/2008 1555    BMET    Component Value Date/Time   NA 140 03/17/2012 0500   K 4.4 03/17/2012 0500   CL 107 03/17/2012 0500   CO2 25 03/17/2012 0500   GLUCOSE 99 03/17/2012 0500   BUN 10 03/17/2012 0500   CREATININE 0.99 03/17/2012 0500   CREATININE 0.89 12/15/2011 1210   CALCIUM 9.3 03/17/2012 0500   GFRNONAA 66* 03/17/2012 0500   GFRAA 76* 03/17/2012 0500   BCx x 2 - no growth final Urine cx - no growth final Stool culture - abundant yeast, decreased normal flora Legionella - neg C. Diff stool - neg  Procedures: MRCP - see above  Hospital Course:  [name] is a 51yo female with h/o HTN, EtOH abuse/alcoholic hepatitis, and breast CA (2001, s/p lumpectomy, chemo, and radiation therapy) presenting with inability to tolerate PO with shortness of breath. Found on admission to have significant LUL pneumonia; also with multiple interrelated issues (HTN/tachycardia, electrolyte abnormalities, acute kidney injury, anemia). Admitted from clinic on 7/18.   1. Aspiration pneumonia: Pt with CXR/CT showing PNA of LUL with cavitation. DDx also included PE and TB, but less likely. Initially thought to be related to aspiration due to hx of alcoholic abuse, though etiology was uncertain. PPD negative 7/22. Pt initially treated with Zosyn and azithromycin, without improvement until addition of Vancomycin on 7/24. Pulmonology (Dr. Molli Knock) was consulted 7/25 for evidence of cavitary PNA with little clinical improvement. Respiratory culture showed only few yeast, compatible with Candida, believed to be contaminant. Vancomycin  discontinued 7/29 due to worsening kidney function, switched to Bactrim for MRSA coverage. Abx discontinued 7/30 and ID was consulted (after pt received 12 days of various abx mentioned above), recommending Augmentin 500-125 BID x 3 weeks total therapy (to end 8/8).  Pt continued on this for rest of hospital course with no issues and pulmonary exam improved daily, pt had no increased oxygen requirements, and pt remained afebrile with decreasing WBC count.  Pt discharged with prescription for Augmentin and will f/u with PCP at [facility] in 1-2 weeks, with recommendation to recheck CXR in 2-3 weeks for resolution of this cavitary pneumonia.  2. 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  CT 7/26 s/p fall showed global atrophy.  Given h/o heavy alcohol use and little apparent clinical improvement of pneumonia and aforementioned symptoms, neurology consulted 7/28 (Dr. Lu Duffel), who felt this was an age-advanced dementia secondary to heavy alcohol abuse.  Pt was maintained on CIWA protocol which was tapered and discontinued after an appropriate amount of time passed.  Infectious causes investigated but unremarkable labs (UA neg but pt on abx mult days, BCx with NGTD, legionella neg, lactic acid and ammonia wnl) and unchanged EKG, and patient's PNA was beginning to resolve, despite evidence of delirium 7/31.  Pt  received ativan PRN for this, but otherwise, avoided sedating meds and ativan only if pt very agitated (because haldol contraindicated with last QTc~460).  Kept sitter in room to avoid falls. Pt's mental status began clearing 8/1 and sitter was discontinued 8/3.  SNF placement recommended and organized for patient prior to discharge.  3. Acute Kidney Injury- Pt very thin with little muscle mass, with initial Cr of  1.13, trended down to 0.47 with IVF and improved hydration. Slow increase since that time, to 1.62 on 7/31.  Etiology unclear, probably multifactorial; pt has been on vancomycin (7/21-7/29) and Bactrim (7/29-7/30), lisinopril restarted 7/25 and dosage increased 7/28. Pt also complaining of intermittent diarrhea and on diuretic (HCTZ) with generally poor PO intake. Nephrotoxic drugs stopped on 7/30 and made sure to not re-add (vancomycin, hctz, lisinopril, bactrim, protonix).  Nephrology consulted and recommended random vanc level, renal US which showed mildly increased echogenicity with no evidence of hydronephrosis).  Ins and outs monitored and patient had no oliguria.  On 7/31 patient was having urinary retention of unclear etiology, and a foley catheter was placed until Cr improved.  Renal function panel monitored daily and Cr improved to 0.99.  Recommend checking Cr as outpatient at f/u 1 week after discharge.    4. Urinary retention - New on 7/31, pt with 1000 mL on in-and-out cath and 830 mL average post-void residual for the prior three voids. Uncertain etiology; pt does not appear to be on any anticholinergic medications. UA on 7/28 and on 7/31 unremarkable, pt has been on extensive abx course for PNA, so UTI seems less unlikely. No evidence of urinary obstruction on renal US. Foley catheter was inserted 7/31, voiding trial attempted and failed 8/1, foley re-placed. Pt was never oliguric. Removed 8/3 and re-attempted voiding trial. Due to elevated PVR's, urology was consulted on day of discharge, recommended double PVR scan. Pt found to be retaining urine (PVR=124) and foley was placed again. Pt is discharged with a foley and will need to f/u with urology as an outpatient in 2-3 weeks.  5. HTN - Slightly low BP on admission, anti-hypertensives were held, then intermittent elevated pressures, possibly due to effexor v. illness and pain. Home HCTZ restarted on 7/21, restarted lisinopril 7/25; lisinopril  increased to 40 mg daily on 7/27. Lisinopril stopped 7/29 due to elevated creatinine, switched to Norvasc 10. BP remained elevated but improved, and tachycardia was seen only intermittently.  Started metoprolol 25 mg BID on 7/30. Pt was discharged on metoprolol and norvasc and lisinopril-HCTZ discontinued.  6. Anemia - Most likely mixed vitamin/iron deficiencies secondary to EtOH abuse and general poor PO intake, repeated blood draws, acute illness, hemodilation; MCV near upper limit of normal with one measurement 101.6, and iron panel on 7/21 shows low iron and TIBC but elevation of ferritin; pt likely with iron metabolism derangement secondary to alcoholism. Hb 11.0 in April 2013, 8.5 on this admission. Trended down to 6.9 on 7/19. Oral iron started 7/21, but discontinued 7/23 as we did not want to iron overload her. Stool initially were hemoccult negative, no bloody emesis. Stool hemoccult positive on 7/23 with Hb 5.6, pt transfused 2 units on 7/24 with Hb rise to 9.5. Hb with slight slow drift down again to 7.  Re-consulted GI but no endoscopy done unless significant overt bleeding which patient did not have.  Hgb settled around 7.4.  Will likely need outpatient GI follow-up if patient continues reporting any melanotic stools.  Continued multivitamin, folate, thiamin.  7. Tachycardia -  Initially believed likely secondary to dehydration given hx of inability to tolerate PO, no evidence of PE since admission. Also considered possible EtOH withdrawal and/or component of anemia, as above. Given elevated BP, DDx could also include more exotic causes such as carcinoid, pheochromocytoma, etc. Pulse stabilized around 80-90s, particularly after adding metoprolol on 7/30.  Discharge pt home on metoprolol.   8. Abdominal pain/anorexia - Patient with history of EtOH abuse and EtOH induced hepatitis, initially presented with abdominal pain, icterus and inability to tolerate PO. Admitted to telemetry and for hydration.  CT shows LUL PNA, gallstones with possible choledocolithiasis and hepatic steatosis with trace upper abd ascites. MRCP on 7/19 is a generally poor study, shows current cholelithiasis and chronic pancreatitis and nonspecific distal common duct findings.  Abdominal pain resolved though pt had continued diarrhea.  CDiff was negative and stool cultures showed yeast, likely due to reduced normal flora.  PO yogurt and immodium prn ordered, and monitored clinically.  Will hopefully continue to improve as antibiotics come off.  Pt was on protonix daily but d/c'ed this given patient's AKI.  Consider restarting as outpatient.  Zofran prn nausea and GI recommended considering pepcid.  Stable at time of discharge.  9. EtOH abuse - Last drink reported >3 days prior to admission. CIWA protocol initiated due to clinical appearance, requiring intermittent dosing 7/19-7/27. Pt was started on scheduled Ativan on 7/22 and CIWA renewed 7/24 with pt receiving some ativan doses.  CIWA discontinued 7/27. Attempted to avoid sedating meds but prescribed Ativan for severe agitation.  Gave pt multivitamin, folic acid, and thiamine daily.  10. Depression - Pt has expressed passive SI in the past as well as in clinic on 7/18. No plan vocalized. Continued home SSRI and monitored closely.  Pt had no SI/HI during hospital course and denied SI/HI on day of discharge.  Effexor possibly contributed to high BPs early in hospital course - consider switching.  11. Hypokalemia - Patient came in on oral potassium 20 meq BID which was stopped when K became elevated but re-added prior to discharge.  Pt remained stable throughout and K 4.4 on 8/4.  Mg low and 5 days of repletion at 4g daily started 8/2.      Discharge Medications:  Medication List  As of 03/17/2012  4:23 PM   STOP taking these medications         lisinopril-hydrochlorothiazide 20-12.5 MG per tablet         TAKE these medications         amLODipine 10 MG tablet   Commonly  known as: NORVASC   Take 1 tablet (10 mg total) by mouth daily.      amoxicillin-clavulanate 500-125 MG per tablet   Commonly known as: AUGMENTIN   Take 1 tablet (500 mg total) by mouth 2 (two) times daily before a meal.      clotrimazole 1 % vaginal cream   Commonly known as: GYNE-LOTRIMIN   Place 1 Applicatorful vaginally 2 (two) times daily as needed (vaginal itching).      feeding supplement Liqd   Take 237 mLs by mouth 3 (three) times daily between meals.      feeding supplement Pudg   Take 1 Container by mouth daily after lunch.      folic acid 1 MG tablet   Commonly known as: FOLVITE   Take 1 tablet (1 mg total) by mouth daily.      magnesium 30 MG tablet   Take 1 tablet (30 mg  total) by mouth 2 (two) times daily.      metoprolol tartrate 25 MG tablet   Commonly known as: LOPRESSOR   Take 1 tablet (25 mg total) by mouth 2 (two) times daily.      multivitamin with minerals Tabs   Take 1 tablet by mouth daily.      potassium chloride SA 20 MEQ tablet   Commonly known as: K-DUR,KLOR-CON   Take 1 tablet (20 mEq total) by mouth 2 (two) times daily.      thiamine 100 MG tablet   Take 1 tablet (100 mg total) by mouth daily.      venlafaxine XR 150 MG 24 hr capsule   Commonly known as: EFFEXOR-XR   Take 1 capsule (150 mg total) by mouth daily.           Issues for Follow Up:  1. Pneumonia: Evaluate symptoms for resolution.  Continue Augmentin until 03/21/12  (last day).  Outpatient CXR 2 weeks after discharge to re-evaluate pneumonia (rec by pulmonology). 2. Hypokalemia - Recheck K, Mg (f/u AKI, hypokalemia, hypomagnesemia) 3. Acute Kidney injury and retention - Recheck creatinine.  Pt discharged with foley catheter in place due to urinary retention. Will need urology follow-up in 2-3 weeks. 4. Abdominal discomfort and Diarrhea - Will need outpatient GI followup if anemia and melena continues. 5. Chronic diseases: Long-term management of HTN, kidney function, anemia,  depression (consider different medication as effexor may be contributing to HTN).  Outstanding Results:  None  Discharge Instructions: Please refer to Patient Instructions section of EMR for full details.  Patient was counseled important signs and symptoms that should prompt return to medical care, changes in medications, dietary instructions, activity restrictions, and follow up appointments.  Follow-up Information    Follow up with [facility]. Schedule an appointment as soon as possible for a visit in 1 week. (Call to make an appointment in 1 week. You need to get a chest x ray when you follow up.)    Contact information:   [address] [place] [address] [phone]      Follow up with Anner Crete, MD. Schedule an appointment as soon as possible for a visit in 2 weeks. (To evaluate your urination and discuss removing the catheter.)    Contact information:   [address] [place] [address] [place] [address] [phone]          Discharge Condition: Stable, to SNF  Levert Feinstein, MD 03/17/2012, 4:23 PM

## 2012-03-14 LAB — CBC
HCT: 22.5 % — ABNORMAL LOW (ref 36.0–46.0)
MCH: 32.8 pg (ref 26.0–34.0)
MCHC: 32.9 g/dL (ref 30.0–36.0)
MCV: 100.4 fL — ABNORMAL HIGH (ref 78.0–100.0)
MCV: 98.7 fL (ref 78.0–100.0)
Platelets: 284 10*3/uL (ref 150–400)
Platelets: 275 10*3/uL (ref 150–400)
RBC: 2.35 MIL/uL — ABNORMAL LOW (ref 3.87–5.11)
RDW: 15.9 % — ABNORMAL HIGH (ref 11.5–15.5)
RDW: 15.7 % — ABNORMAL HIGH (ref 11.5–15.5)
WBC: 7.1 10*3/uL (ref 4.0–10.5)

## 2012-03-14 LAB — BASIC METABOLIC PANEL
BUN: 10 mg/dL (ref 6–23)
Calcium: 8.9 mg/dL (ref 8.4–10.5)
GFR calc Af Amer: 42 mL/min — ABNORMAL LOW (ref >90)
GFR calc non Af Amer: 36 mL/min — ABNORMAL LOW (ref >90)
Glucose, Bld: 77 mg/dL (ref 70–99)
Potassium: 4.4 mEq/L (ref 3.5–5.1)

## 2012-03-14 NOTE — Progress Notes (Signed)
FMTS Attending Daily Note:  Renold Don MD  [phone] pager  Family Practice pager:  [phone] I have seen and examined this patient and have reviewed their chart. I have discussed this patient with the resident. I agree with the resident's findings, assessment and care plan.  Issues: -PNA seems stable with decreasing white count and continued improving respiratory status.  No oxygen requirement. - AKI:  Creatinine continues to trend upwards.  Renal consulted today.  Continuing to hold nephrotoxics.  Doubt pre-renal as she has been receiving 100 cc fluid daily as well as good PO intake.  She has had Lisinopril, Vanc (with high trough) and dye load roughly around time creatinine started to rise.  No hydronephrosis by CT or renal ultrasound, so less likely post-renal.  Appreciate Nephrology help. - Urinary retention:  No anti-cholinergic meds.  Unclear etiology.  Negative U/A.  Trial of voiding today.  - Anemia:  Acute drop in Hgb.  May need to reconsult GI, she has not had workup in house.  Will follow CBC's.    Tobey Grim, MD

## 2012-03-14 NOTE — Progress Notes (Signed)
Family Medicine Teaching Service Daily Progress Note Intern Pager: [phone]  Patient name: [name] Medical record number: [id number] Date of birth: 12-19-1960 Age: 51 y.o. Gender: female  Primary Care Provider: MATTHEWS,CODY, DO  Subjective: Pt in bed today, asleep but wakes on questioning; mild chest pain, no increased WOB or SOB and no increased O2 requirement overnight.  Continued AH/VH but no SI or HI.  Urine retention yesterday so foley catheter placed last night.  1 BM yesterday loose.  Per nursing, somewhat more coherent this AM.  Objective: Temp:  [97.8 F (36.6 C)-99.5 F (37.5 C)] 99.5 F (37.5 C) (08/01 0543) Pulse Rate:  [81-92] 92  (08/01 0543) Resp:  [18-20] 18  (08/01 0543) BP: (134-152)/(85-106) 134/85 mmHg (08/01 0543) SpO2:  [97 %-100 %] 100 % (08/01 0543) Weight:  [95 lb 14.4 oz (43.5 kg)] 95 lb 14.4 oz (43.5 kg) (08/01 0543) Exam: General: Pt alert and oriented x 2 (to year but not month or day), speech understandable, answering questions appropriately, falls asleep during exam.  Cardiovascular: RRR, no murmur appreciated Respiratory: good inspiratory effort better than early in course; no increased work of breathing, right lower fields clear to auscultation, right upper lobe and left lung with scattered coarse crackles worse in upper fields, but overall improving Abdomen: soft, nontender, non-distended, hyperactive BS Extremities: previously, occasional coarse intention tremor, mostly in arms; not present at rest  Laboratory:  Lab 03/14/12 0500 03/13/12 0500 03/12/12 0555  WBC 7.1 10.6* 11.4*  HGB 7.4* 8.6* 8.3*  HCT 22.5* 26.1* 24.6*  PLT 284 315 287    Lab 03/14/12 0500 03/13/12 0500 03/12/12 0555 03/11/12 0630  NA 139 138 141 --  K 4.4 5.5* 4.8 --  CL 106 105 105 --  CO2 22 21 24  --  BUN 10 9 8  --  CREATININE 1.62* 1.51* 1.45* --  CALCIUM 8.9 8.9 8.2* --  PROT -- -- -- 7.2  BILITOT -- -- -- 0.9  ALKPHOS -- -- -- 79  ALT -- -- -- 8  AST --  -- -- 18  GLUCOSE 77 88 78 --    Lab 03/13/12 0500  INR 1.15   HIV: nonreactive, 7/18 Fecal occult blood:  Negative on 7/20  POSITIVE on 7/23  Imaging/Diagnostic Tests: CXR, 7/18 @1424  IMPRESSION:  Dense pneumonia in the left lung, probably the left upper lobe.  CT, 7/18 @2028  IMPRESSION:  Dense left upper lobe and superior segment left lower lobe  consolidation, most compatible with pneumonia. Collapse with  proximal endobronchial lesion is felt less likely.  Gallstones with possible choledocholithiasis or volume averaging  from pancreatic calcification. Consider MRCP for further  evaluation.  Hepatic steatosis.  Trace upper abdominal ascites.  MRCP, 7/19 @1243  IMPRESSION:  1. Moderate to severely degraded exam, secondary to motion and  inability to hold breath.  2. No biliary ductal dilatation. Distal common duct findings,  which when combined with CT findings, are most consistent with  small distal common duct stones. Concurrent cholelithiasis, as on CT.  3. Findings of chronic pancreatitis, without definite acute  superimposed process.  4. Marked hepatic steatosis.  5. Left base air space disease and adjacent fluid, as before.  6. Renal and perirenal edema with suggestion of decreased contrast  excretion on delayed images. Question acute or subacute renal  Insufficiency.  CXR, 7/21 IMPRESSION:  1. Significantly worsening aeration throughout the left lung,  compatible with progressive multilobar pneumonia.  2. New small bilateral pleural effusions.  3. Atherosclerosis.  CT Chest,  7/23 @1520  IMPRESSION:  1. Progressive multifocal pneumonia on the left with  consolidation, cavitation and lobar expansion. These features are  most suggestive of infection with a gram-negative organism  (Klebsiella, E Coli or Pseudomonas).  2. Associated small right greater than left pleural effusions and  probable reactive mediastinal lymphadenopathy.  CT Head, 7/26  @1331  IMPRESSION:  1. No acute intracranial abnormality. No acute traumatic injury  identified.  2. Age advanced global atrophy.  CXR, 7/28 @1402  IMPRESSION:  Right lung is clear. Persistent consolidation with air bronchogram  and cavitation in the left upper lobe consistent with pneumonia.  Slight improvement in aeration left lower lobe.  Renal US, 7/31 @1116  IMPRESSION:  Slight increased echotexture within the kidneys bilaterally. This  can be seen with chronic medical renal disease. No hydronephrosis.  Assessment/Plan: [name] is a 51yo female with PMH significant for HTN, EtOH abuse/alcoholic hepatitis, and breast CA (2001, s/p lumpectomy, chemo, and radiation therapy) presenting with inability to tolerate PO with shortness of breath. Found on admission to have significant LUL pneumonia; also with multiple interrelated issues (HTN/tachycardia, electrolyte abnormalities, acute kidney injury). Admitted from clinic on 7/18. Currently with urinary retention of unknown origin.  1. Shortness of breath/Cough: CXR/CT findings PNA of LUL with cavitation.  DDx includes aspiration pna with h/o alcohol abuse, TB.  Subjective fevers at home, afebrile since admission. PPD negative on 7/22. Vancomycin added 7/21 as pt was not improving on Zosyn + azithromycin. Pulmonology (Dr. Molli Knock) consulted 7/25 for evidence of cavitary PNA with little clinical improvement. Respiratory culture shows only few yeast, compatible with Candida, ?contaminant. Vancomycin discontinued 7/29 due to kidney function, switched to Bactrim for MRSA coverage. Abx discontinued 7/30 and ID consulted, recommending Augmentin 500-125 BID for three weeks of total therapy (8/8 last day). Lung exams continue to slowly improve. WBC continue to trend down, 7.1 today. - Continue Augmentin BID (last day of dosing 8/8) for 3 weeks of total therapy. (Pt received abx for 12 total days, MRSA coverage for 10, Zosyn for 12, Azithro for 3, Vanc for  9, Bactrim for 1). - Will consider repeat CT chest to evaluate for closing of cavitation - Will monitor closely for increased O2 need, fevers, etc. Supplemental O2 as needed; no increased O2 reqmt or fevers to date. - Incentive spirometry.  - Appreciate all consult recommendations/assistance.  2. Mental status/tremor - Waxing and waning confusion/reduced responsiveness, coarse tremor on exam especially with movement, and global atrophy on CT 7/26 s/p fall. Also with new complaint of diarrhea, per nursing. Given history of heavy alcohol use and little apparent clinical improvement of pt's pneumonia in addition to the above symptoms, neurology was consulted, 7/28 (Dr. Lu Duffel). CXR improved on 7/28 slightly from previous days. EKG without changed, lactic acid slightly increased, ammonia normal. UA negative but pt has been on abx for multiple days.  Blood cultures show no growth to date and Legionella negative. 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 Ativan scheduled has been discontinued as of 7/30. Mental status still with some slight waxing and waning but overall much improved. Remains 7/31 waxing/waning, with some agitation, trying to get out of bed, and reported VH per nursing. - Avoid Haldol (last QTc ~460), but may order Ativan 0.5 mg PRN q8 for agitation per neuro recs; would avoid meds completely if possible. Sitter in room, as pt is a fall risk, as well.  - Neuro recommendations appreciated.   3. Acute kidney injury - Pt very thin with  little muscle mass, with initial Cr of 1.13, trended down to 0.47 with IVF and improved hydration. Slow increase since that time, to 1.45 on 7/30. Etiology unclear, probably multifactorial; pt has been on vancomycin (7/21-7/29) and Bactrim (7/29-7/30), lisinopril restarted 7/25 and dosage increased 7/28; last IV contrast admin 7/23. Pt also complaining of intermittent diarrhea and on diuretic (HCTZ) with generally poor PO intake.  Nephrotoxic drugs d/c'd and held on 7/30. Nephrology contacted 7/31 (Dr. Eliott Nine), recommended random vanc level to see if med is still present.  Renal US shows mild increased echogenicity; no apparent evidence of glomerulonephropathy. Cr mildly elevated 7/30 --> 7/31, now 1.62; rate of increase appears to be slowing. - Continue to avoid nephrotoxins and monitor Cr and strict I:O for oliguria.  - Consulted nephrology [facility]) and appreciate recs.  - Hold oral K yesterday and today.  - Renal panel in AM  - Will likely recommend checking Cr 1 week after discharge.  4. Urinary retention - New on 7/31, pt with 1000 mL on in-and-out cath and 830 mL average post-void residual for the prior three voids. Uncertain etiology; pt does not appear to be on any anticholinergic medications. UA on 7/28 and on 7/31 unremarkable, pt has been on extensive abx course for PNA, so UTI seems less unlikely. No evidence of urinary obstruction on renal US.  Post-void residual with 640cc yesterday, inserted foley catheter last night. - D/c foley today and voiding trial. - Appreciate renal c/s recommendations for etiology and management. - Strict I&O  5. HTN: Slightly low BP on admission, anti-hypertensives were held, then intermittent elevated pressures. Home HCTZ restarted on 7/21, restarted lisinopril 7/25; lisinopril increased to 40 mg daily on 7/27. Lisinopril stopped 7/29 due to elevated creatinine, switched to Norvasc 10. BP remains elevated but improved, 134/85 now, with tachycardia now only intermittently. - Continue Norvasc 10 BID, started 7/29. - Started metoprolol 25 mg BID on 7/30.  - Hydralazine PRN for BP >180/100.  6. Anemia - Most likely mixed vitamin/iron deficiencies secondary to EtOH abuse and general poor PO intake; MCV near upper limit of normal with one measurement 101.6, and iron panel on 7/21 shows low iron and TIBC but elevation of ferritin; pt likely with iron metabolism  derangement secondary to alcoholism. Hb 11.0 in April 2013, 8.5 on admission this time. Trended down to 6.9 on 7/19. Now 7.6 on 7/20. Oral iron started 7/21, but discontinued 7/23; we do not want to iron overload her.  Stool initially were hemoccult negative, no bloody emesis. Stool hemoccult positive on 7/23. Hb 5.6, pt transfused 2 units on 7/24 with Hb rise to 9.5. Hb with slight slow drift down again, 7/26 9.0 --> 7/27 8.7 --> 7/28 8.5. Stable 7/29 at 8.6, 7/30 at 8.3, 7/31 at 8.6. - Will continue to monitor CBC's - Drift may be at least partially due to extensive lab draws.  - Continue multivitamin, folate, thiamine.  - Consider transfusion if Hgb below 7 - May refer pt for outpt GI follow-up vs re-consult during this admission, per Dr. Marzetta Board note on 7/24.  7. Tachycardia: Initially believed likely secondary to dehydration given hx of inability to tolerate PO, no evidence of PE since admission. Also considered possible EtOH withdrawal and/or component of anemia, as above. Given elevated BP, DDx could also include more exotic causes such as carcinoid, pheochromocytoma, etc. Intermittent pulse rate in normal range around 80-90's, now more steady in normal range after adding metoprolol 7/30. Monitor with other signs/symptoms.  92  today  8. Abdominal pain/anorexia: Patient with history of EtOH abuse and EtOH induced hepatitis, initially presented with abdominal pain, icterus and inability to tolerate PO. Broad differential including pancreatitis, hepatitis, malignancy, gallstones, gastritis, antibiotic side effect. Admitted to telemetry and for hydration. CT shows LUL PNA, gallstones with possible choledocolithiasis and hepatic steatosis with trace upper abd ascites. MRCP on 7/19 is a generally poor study, shows current cholelithiasis and chronic pancreatitis and nonspecific distal common duct findings. Abdominal pain appears resolved; some complaints diarrhea intermittently.  C.diff negative 7/28.  Stool cultures show yeast likely due to reduced normal flora.  - PO yogurt to improve GI flora - Immodium PRN, one dose yesterday afternoon.  - Will monitor clinically; hopefully will improve as abx come off.  - Protonix daily.  - Cont. Zofran PRN for nausea.  - GI outpt follow-up vs re-consult, as above.  9. EtOH abuse: Last drink reported >3 days prior to admission, deemed unlikely to withdraw if she has truly not had anything to drink more recently. CIWA protocol initiated due to clinical appearance, requiring intermittent dosing 7/19-7/27. Pt was started on scheduled Ativan on 7/22. CIWA last renewed 7/24; pt received 4 doses on 7/26-7/27 overnight, probably inappropriately as pt is well outside the window for EtOH withdrawal and has had tremor, tachycardia, and elevated BP since admission; CIWA discontinued 7/27. - Continue to avoid sedating meds, but will address agitation as appropriate with PRNs.  - Multivitamin, folic acid, thiamine.  10. Depression: Has expressed passive SI in the past as well as in clinic on 7/18. No plan vocalized. Will continue home SSRI and monitor closely. Possible contribution to elevated BP by Effexor; may consider changing.  No SI/ HI today.  11. PT/OT - Updated notes reflect recommendation for SNF placement.  CSW actively involved and pt agreeable. - Assistance and recommendations from all sides is appreciated.  FEN/GI: Dys 3 diet, add yogurt today; NS @ 75 mL/hr. MVI with thiamine and folic acid; Multiple K and Mg replenishments throughout stay; will continue as appropriate.  - K: 4.4; holding oral replacement today and yest, reconsider tomorrow; Mg: 2.2  PPX: Lovenox d/ced 7/23 as FOBT is positive, SCD's only for now; Protonix.  Dispo: Pending clinical improvementely to SNF; Consider outpatient GI to evaluate if GI bleeding source of chronic anemia  Leona Singleton, MD PGY-1, [facility] Family Medicine FPTS Intern pager: [phone]

## 2012-03-14 NOTE — Progress Notes (Signed)
03/14/12 1026 Tera Mater, RN, BSN NCM [phone] Pt. became very confused yesterday while her sister was visiting.  Now requires a sitter at bedside.  Presently, pt. has Letter of [place] for SNF placement at [facility] when medically stable to discharge.  Will continue to monitor for discharge needs.

## 2012-03-14 NOTE — Consult Note (Signed)
Nephrology Service Initial Consult Note  Reason for Consult: AKI, urinary retention Referring Physician: Simone Curia  HPI: [name] is an 51 y.o. female, history of alcoholism, HTN, breast cancer s/p resection/chemo/rad, admitted 7/18 with cough, found to have cavitary pneumonia.  During the patient's hospital course, her creatinine has slowly increased, perhaps starting as early as 7/25, but most notably increasing 7/28-8/1, from a baseline of around 0.7, to a peak today of 1.62.  Also, the patient was noted to have urinary retention on 7/31, with 1000 cc obtained via in-and-out catheter on 7/31, and 830 mL on PVR during 3 prior voids.  A foley catheter was placed 7/31 (18:30), then discontinued 8/1 for a voiding trial.  Renal US 7/31 (prior to foley placement) showed no hydronephrosis or obvious obstruction.  Recent medications to note include Bactrim 7/29-7/30, Lisinopril 7/25-7/29, Vancomycin 7/22-7/29 (supratherapeutic trough of 31 on 7/27, random level of 14 on 7/31).  The patient notes no prior history of urinary retention or prior kidney injury.  Presently she describes her symptoms as feeling that she needs to urinate, but difficulty initiating or maintaining a stream, only able to urinate "a few drops" at a time.  She currently notes no suprapubic tenderness, but noted some yesterday with retention of 1000 mL of urine.   She notes no dysuria, LE edema, SOB, dyspnea, cp, fever, rash, constipation, nausea, vomiting.  Her last bowel movement was today, and described as "soft".   Urine output: - concern that some values may not have been strictly measured 8/1: 400 mL (as of 3:00 pm) 7/31: 2730 mL 7/30: 700 mL 7/29: 1250 mL 7/28: 250 mL 7/27: 500 mL 7/26: 475 mL 7/25: 225 mL 7/24: 1650 mL  IVF: Normal Saline 50 cc/hr 7/23-7/24 100 cc/hr 7/24-7/26 75 cc/hr 7/26-7/28 100 cc/hr 7/28-8/1   PMH:   Past Medical History  Diagnosis Date  . Hypertension   . Alcoholic  hepatitis   . Alcoholism   . Breast cancer 2001    Rt side. Had lumpectomy, chemo and XRT by [facility]  . Shortness of breath     PSH:   Past Surgical History  Procedure Date  . Rt lumpectomy 2001    Allergies: No Known Allergies  Medications:   Prior to Admission medications   Medication Sig Start Date End Date Taking? Authorizing Provider  lisinopril-hydrochlorothiazide (ZESTORETIC) 20-12.5 MG per tablet Take 1 tablet by mouth daily. 12/29/11 12/28/12 Yes Rodolph Bong, MD  magnesium 30 MG tablet Take 1 tablet (30 mg total) by mouth 2 (two) times daily. 12/18/11 12/17/12  Rodolph Bong, MD  potassium chloride SA (K-DUR,KLOR-CON) 20 MEQ tablet Take 1 tablet (20 mEq total) by mouth 2 (two) times daily. 11/25/11 11/24/12  Flint Melter, MD  venlafaxine XR (EFFEXOR-XR) 150 MG 24 hr capsule Take 1 capsule (150 mg total) by mouth daily. 02/02/12 02/01/13  Rodolph Bong, MD    Discontinued Meds:   Scheduled Meds:   . amLODipine  10 mg Oral Daily  . amoxicillin-clavulanate  1 tablet Oral BID AC  . feeding supplement  237 mL Oral TID BM  . folic acid  1 mg Oral Daily  . metoprolol tartrate  25 mg Oral BID  . multivitamin with minerals  1 tablet Oral Daily  . pantoprazole  40 mg Oral Q1200  . sodium chloride  3 mL Intravenous Q12H  . thiamine  100 mg Oral Daily   Or  . thiamine  100 mg Intravenous Daily  .  tuberculin  5 Units Intradermal Once  . venlafaxine XR  150 mg Oral Daily   Continuous Infusions:   . sodium chloride 100 mL/hr at 03/14/12 0444   PRN Meds:.acetaminophen, clotrimazole, hydrALAZINE, loperamide, LORazepam, LORazepam, ondansetron (ZOFRAN) IV, ondansetron, sodium chloride  Family History:  History reviewed. No pertinent family history.  Social History:  reports that she quit smoking about 11 years ago. Her smoking use included Cigarettes. She has never used smokeless tobacco. She reports that she drinks about 25.2 ounces of alcohol per week. She reports that she does not use  illicit drugs.  ROS: General: no fevers, chills, changes in weight, changes in appetite Skin: no rash HEENT: no blurry vision, hearing changes, sore throat Pulm: current pneumonia CV: no chest pain, palpitations Abd: no nausea/vomiting, diarrhea/constipation GU: no dysuria, hematuria, polyuria Ext: no arthralgias, myalgias Neuro: no weakness, numbness, or tingling  Blood pressure 135/90, pulse 96, temperature 98.7 F (37.1 C), temperature source Oral, resp. rate 19, height 5\' 7"  (1.702 m), weight 95 lb 14.4 oz (43.5 kg), SpO2 98.00%.  PEX General: alert, cooperative, and in no apparent distress, though with cognitive/memory deficits noted HEENT: pupils equal round and reactive to light, vision grossly intact, oropharynx clear and non-erythematous  Neck: supple, no lymphadenopathy, no JVD Lungs: clear to ascultation bilaterally, normal work of respiration, no wheezes, rales, ronchi Heart: regular rate and rhythm, no murmurs, gallops, or rubs Abdomen: soft, non-tender, non-distended, no suprapubic tenderness, normal bowel sounds Extremities: no cyanosis, clubbing, or edema Neurologic: Cranial nerves II-XII intact, strength grossly intact, sensation intact to light touch   Creat  Date/Time Value Range Status  12/15/2011 12:10 PM 0.89  0.50 - 1.10 mg/dL Final  16/05/9603 54:09 AM 0.65  0.50 - 1.10 mg/dL Final  03/24/9146 82:95 PM 0.74  0.40 - 1.20 mg/dL Final     Creatinine, Ser  Date/Time Value Range Status  03/14/2012  5:00 AM 1.62* 0.50 - 1.10 mg/dL Final  02/01/3085  5:78 AM 1.51* 0.50 - 1.10 mg/dL Final  4/69/6295  2:84 AM 1.45* 0.50 - 1.10 mg/dL Final  1/32/4401  0:27 AM 1.31* 0.50 - 1.10 mg/dL Final  2/53/6644  0:34 AM 1.15* 0.50 - 1.10 mg/dL Final  7/42/5956  3:87 AM 0.93  0.50 - 1.10 mg/dL Final  5/64/3329  5:18 AM 0.86  0.50 - 1.10 mg/dL Final  8/41/6606  3:01 PM 0.75  0.50 - 1.10 mg/dL Final  01/13/931  3:55 AM 0.59  0.50 - 1.10 mg/dL Final  7/32/2025  4:27 AM 0.51  0.50  - 1.10 mg/dL Final  0/62/3762  8:31 AM 0.47* 0.50 - 1.10 mg/dL Final  12/29/6158  7:37 AM 0.50  0.50 - 1.10 mg/dL Final  08/19/2692 85:46 AM 0.56  0.50 - 1.10 mg/dL Final  2/70/3500  9:38 AM 0.66  0.50 - 1.10 mg/dL Final  1/82/9937  1:69 PM 0.72  0.50 - 1.10 mg/dL Final  6/78/9381  0:17 AM 0.79  0.50 - 1.10 mg/dL Final  12/22/2583  2:77 PM 0.90  0.50 - 1.10 mg/dL Final  04/06/2352  6:14 PM 1.13* 0.50 - 1.10 mg/dL Final  4/31/5400  8:67 PM 0.96  0.50 - 1.10 mg/dL Final  61/04/5092 26:71 AM 0.82  0.50 - 1.10 mg/dL Final  09/18/5807  9:83 AM 0.61  0.4 - 1.2 mg/dL Final  10/20/2503  3:97 AM 0.58  0.4 - 1.2 mg/dL Final  01/18/3418 37:90 AM 0.58  0.4 - 1.2 mg/dL Final  09/18/971 53:29 AM 0.59  0.4 - 1.2 mg/dL Final  10/16/2010  4:22 AM 0.71  0.4 - 1.2 mg/dL Final  11/18/8293  6:21 AM 0.81  0.4 - 1.2 mg/dL Final  3/0/8657  8:46 PM 1.18  0.4 - 1.2 mg/dL Final  04/20/2951  8:41 PM 1.3 QA FLAGS AND/OR RANGES MODIFIED BY DEMOGRAPHIC UPDATE ON 03/02 AT 1427* 0.4 - 1.2 mg/dL Final  11/04/4008  2:72 PM 0.66  0.40-1.20 mg/dL Final  5/36/6440  3:47 PM 0.79  0.40 - 1.20 mg/dL Final  12/06/9561  8:75 PM 0.60  0.40-1.20 mg/dL Final  6/43/3295  1:88 PM 0.69  0.40 - 1.20 mg/dL Final  11/27/6061  0:16 AM 0.75   Final  02/18/2007  3:09 PM 0.66  0.40 - 1.20 mg/dL Final  0/05/9322  5:57 PM 0.58  0.40 - 1.20 mg/dL Final  11/02/252  2:70 AM 0.57  0.40 - 1.20 mg/dL Final  02/04/7627  3:15 PM 0.53  0.40 - 1.20 mg/dL Final  08/20/6158  7:37 PM 0.60  0.40 - 1.20 mg/dL Final  08/19/2692  8:54 AM 0.51  0.40 - 1.20 mg/dL Final  01/13/7034  0:09 PM 0.61  0.40 - 1.20 mg/dL Final  38/18/2993 7.16   Final  12/27/2005  2:01 PM 0.8  0.4 - 1.2 mg/dL Final  9/67/8938  1:01 PM 0.6  0.4 - 1.2 mg/dL Final    Results for orders placed during the hospital encounter of 02/29/12 (from the past 48 hour(s))  BASIC METABOLIC PANEL     Status: Abnormal   Collection Time   03/13/12  5:00 AM      Component Value Range Comment   Sodium 138  135 - 145 mEq/L     Potassium 5.5 (*) 3.5 - 5.1 mEq/L    Chloride 105  96 - 112 mEq/L    CO2 21  19 - 32 mEq/L    Glucose, Bld 88  70 - 99 mg/dL    BUN 9  6 - 23 mg/dL    Creatinine, Ser 7.51 (*) 0.50 - 1.10 mg/dL    Calcium 8.9  8.4 - 02.5 mg/dL    GFR calc non Af Amer 40 (*) >90 mL/min    GFR calc Af Amer 46 (*) >90 mL/min   MAGNESIUM     Status: Normal   Collection Time   03/13/12  5:00 AM      Component Value Range Comment   Magnesium 2.2  1.5 - 2.5 mg/dL   CBC     Status: Abnormal   Collection Time   03/13/12  5:00 AM      Component Value Range Comment   WBC 10.6 (*) 4.0 - 10.5 K/uL    RBC 2.66 (*) 3.87 - 5.11 MIL/uL    Hemoglobin 8.6 (*) 12.0 - 15.0 g/dL    HCT 85.2 (*) 77.8 - 46.0 %    MCV 98.1  78.0 - 100.0 fL    MCH 32.3  26.0 - 34.0 pg    MCHC 33.0  30.0 - 36.0 g/dL    RDW 24.2 (*) 35.3 - 15.5 %    Platelets 315  150 - 400 K/uL   PROTIME-INR     Status: Normal   Collection Time   03/13/12  5:00 AM      Component Value Range Comment   Prothrombin Time 14.9  11.6 - 15.2 seconds    INR 1.15  0.00 - 1.49   URINALYSIS, ROUTINE W REFLEX MICROSCOPIC     Status: Normal   Collection Time   03/13/12 11:04 AM  Component Value Range Comment   Color, Urine YELLOW  YELLOW    APPearance CLEAR  CLEAR    Specific Gravity, Urine 1.010  1.005 - 1.030    pH 8.0  5.0 - 8.0    Glucose, UA NEGATIVE  NEGATIVE mg/dL    Hgb urine dipstick NEGATIVE  NEGATIVE    Bilirubin Urine NEGATIVE  NEGATIVE    Ketones, ur NEGATIVE  NEGATIVE mg/dL    Protein, ur NEGATIVE  NEGATIVE mg/dL    Urobilinogen, UA 0.2  0.0 - 1.0 mg/dL    Nitrite NEGATIVE  NEGATIVE    Leukocytes, UA NEGATIVE  NEGATIVE MICROSCOPIC NOT DONE ON URINES WITH NEGATIVE PROTEIN, BLOOD, LEUKOCYTES, NITRITE, OR GLUCOSE <1000 mg/dL.  VANCOMYCIN, RANDOM     Status: Normal   Collection Time   03/13/12  4:05 PM      Component Value Range Comment   Vancomycin Rm 14.0     CBC     Status: Abnormal   Collection Time   03/14/12  5:00 AM      Component  Value Range Comment   WBC 7.1  4.0 - 10.5 K/uL    RBC 2.24 (*) 3.87 - 5.11 MIL/uL    Hemoglobin 7.4 (*) 12.0 - 15.0 g/dL    HCT 09.8 (*) 11.9 - 46.0 %    MCV 100.4 (*) 78.0 - 100.0 fL    MCH 33.0  26.0 - 34.0 pg    MCHC 32.9  30.0 - 36.0 g/dL    RDW 14.7 (*) 82.9 - 15.5 %    Platelets 284  150 - 400 K/uL   MAGNESIUM     Status: Normal   Collection Time   03/14/12  5:00 AM      Component Value Range Comment   Magnesium 1.5  1.5 - 2.5 mg/dL   BASIC METABOLIC PANEL     Status: Abnormal   Collection Time   03/14/12  5:00 AM      Component Value Range Comment   Sodium 139  135 - 145 mEq/L    Potassium 4.4  3.5 - 5.1 mEq/L DELTA CHECK NOTED   Chloride 106  96 - 112 mEq/L    CO2 22  19 - 32 mEq/L    Glucose, Bld 77  70 - 99 mg/dL    BUN 10  6 - 23 mg/dL    Creatinine, Ser 5.62 (*) 0.50 - 1.10 mg/dL    Calcium 8.9  8.4 - 13.0 mg/dL    GFR calc non Af Amer 36 (*) >90 mL/min    GFR calc Af Amer 42 (*) >90 mL/min     US Renal  03/13/2012  *RADIOLOGY REPORT*  Clinical Data: Elevated creatinine.  RENAL/URINARY TRACT ULTRASOUND COMPLETE  Comparison:  MRI 03/01/2012  Findings:  Right Kidney:  12.3 cm.  Slightly increased echotexture.  No focal abnormality or hydronephrosis.  Left Kidney:  12.7 cm.  Slightly increased echotexture.  No focal abnormality or hydronephrosis.  Bladder:  Normal appearance.  IMPRESSION: Slight increased echotexture within the kidneys bilaterally.  This can be seen with chronic medical renal disease.  No hydronephrosis.  Original Report Authenticated By: Cyndie Chime, M.D.     Assessment/Plan:  The patient is a 51 yo woman, history of HTN, alcoholism, presenting 7/18 with cavitary pneumonia, consulted for urinary retention and AKI.  # AKI - the patient's creatinine has increased from her baseline of 0.7 to a maximum of 1.62.  The etiology of this rise is unclear,  and the differential is wide.  I believe the most likely diagnosis is a bactrim-induced rise in creatinine  (via TMP binding to proximal tubule transporter, Bactrim 7/29-7/30), which has likely not resolved due to subsequent development of urinary retention (see discussion below).  Differential also includes components of ACE-I use (lisinopril 7/25-7/29) vs AIN (Zosyn 7/18-7/31, HCTZ 7/21-7/30, less commonly protonix 7/18-8/1. However, no esosinophilia on CBC 7/29, no rash) vs diuretic use (HCTZ 7/21-7/30, unlikely given urinary retention).  Unlikely contrast-induced ATN (03/05/12), as timing does not correlate and symptoms should have improved by now with IVF.  BP acutely dropped from 145/89 to 100/77 on 7/30, but this would likely not be low enough to cause ATN.  Given that she has 2 recent UA's with no proteinuria or hematuria, glomerulonephritis is extremely unlikely. -continue IVF -we recommend re-placing foley catheter.  If AKI was indeed initially triggered by bactrim, this will allow creatinine to improve by removing any post-renal component of AKI. -consider discontinuing protonix, though as discussed this is unlikely the culprit  # Urinary Retention - The patient has a 1-day history of urinary retention.  Differential includes medication-induced (significant ativan usage, though held 7/28-7/30. No anticholinergic usage) vs urethral diverticula vs neurogenic etiology (less likely).  Unlikely UTI (negative UA) vs urethral stricture (acute onset). -re-place foley catheter.  Plan to remove once creatinine improves, perhaps in as soon as 1-2 days. -if urinary retention persists at that time, consider urology consult for further evaluation (ie cystoscopy)  # LUL Pneumonia - responding to antibiotics -management per primary team  # Mental Status - waxing and waning confusion  # HTN - well-controlled  # Anemia - Stable, Ferritin 2724, iron 10, TIBC: 78, Saturation Ratio: 13.  # Alcohol Abuse - MVI, folate, thiamine  Signed, Sneijder Bernards 03/14/2012, 3:25 PM

## 2012-03-14 NOTE — Consult Note (Signed)
I have personally seen and examined this patient and agree with the assessment/plan as outlined above by Janalyn Harder M.D. (PGY2). 51 year old woman with a prolonged hospital stay for necrotizing pneumonia. Recently convoluted history involving Bactrim exposure, HCTZ/ACE inhibitor use and urinary retention. Suspect that her elevated creatinine may have been initiated by ongoing Bactrim therapy and its effects on creatinine secretion-no significant change is noted between yesterday and today which points to some effects of her urinary retention on her renal function. We'll see what her urine function does overnight with her Foley catheter in situ. Urinalysis and urinary sediment was evaluated and does not appear to be consistent with a glomerulonephritis-screening for which will not be undertaken at this time. Renal ultrasound results reviewed and found not to have obvious obstruction/lesions.  Vancomycin levels were elevated 5 days ago however not felt to be directly responsible for drug-induced ATN. Contrast exposure appears to have been remote for the most recent creatinine rise. We'll continue to follow Ms. Wonders for her non-oliguric acute renal failure.  Onita Pfluger K.,MD 03/14/2012 5:40 PM

## 2012-03-14 NOTE — Progress Notes (Signed)
Physical Therapy Treatment Patient Details Name: [name] MRN: [id number] DOB: Dec 16, 1960 Today's Date: 03/14/2012 Time: 0946-1000 PT Time Calculation (min): 14 min  PT Assessment / Plan / Recommendation Comments on Treatment Session  Pt with increased steadiness during ambulation today but still fairly unsafe.  Performed various gait/balance challenges during ambulation.      Follow Up Recommendations  Skilled nursing facility;Supervision/Assistance - 24 hour    Barriers to Discharge        Equipment Recommendations  Defer to next venue    Recommendations for Other Services    Frequency Min 3X/week   Plan Discharge plan remains appropriate;Frequency remains appropriate    Precautions / Restrictions Precautions Precautions: Fall Restrictions Weight Bearing Restrictions: No       Mobility  Bed Mobility Bed Mobility: Supine to Sit;Sitting - Scoot to Edge of Bed Supine to Sit: 6: Modified independent (Device/Increase time) Sitting - Scoot to Edge of Bed: 6: Modified independent (Device/Increase time) Transfers Transfers: Sit to Stand;Stand to Sit Sit to Stand: 4: Min guard;With upper extremity assist;From bed Stand to Sit: 5: Supervision;With upper extremity assist;To bed Details for Transfer Assistance: Pt cont's to be slightly unsteady with initial standing (anterior<>posterior sway) but no physical assistance needed.  Cues to ensure balance before initiating ambulation.   Ambulation/Gait Ambulation/Gait Assistance: 4: Min assist Ambulation Distance (Feet): 300 Feet Assistive device: None Ambulation/Gait Assistance Details: Assist for balance, however pt was more steady today compared to yesterday.  Performed various challenges such as gait speed changes, directional changes (increased unsteadiness noted), head turns in all directions, stop/go commands.  Pt with narrow BOS, decreased step/stride length, decreased gait speed unless cued to go faster, abducted gait Gait  Pattern: Step-through pattern;Decreased stride length;Narrow base of support (abducted gait) Stairs: No Wheelchair Mobility Wheelchair Mobility: No      PT Goals Acute Rehab PT Goals Time For Goal Achievement: 03/15/12 Potential to Achieve Goals: Good PT Goal: Sit to Supine/Side - Progress: Met PT Goal: Sit to Stand - Progress: Progressing toward goal PT Goal: Stand to Sit - Progress: Progressing toward goal PT Goal: Ambulate - Progress: Progressing toward goal  Visit Information  Last PT Received On: 03/14/12 Assistance Needed: +1    Subjective Data      Cognition  Overall Cognitive Status: Impaired Area of Impairment: Safety/judgement;Awareness of deficits Arousal/Alertness: Awake/alert Orientation Level: Disoriented to;Place;Time;Situation Behavior During Session: Flat affect Safety/Judgement: Decreased awareness of safety precautions;Decreased awareness of need for assistance Awareness of Deficits: Pt unaware of balance deficits and fall risk.    Balance     End of Session PT - End of Session Equipment Utilized During Treatment: Gait belt Activity Tolerance: Patient tolerated treatment well Patient left: Other (comment) (sitting EOB with sitter present) Nurse Communication: Mobility status     Verdell Face, [place] [phone] 03/14/2012

## 2012-03-15 DIAGNOSIS — D649 Anemia, unspecified: Secondary | ICD-10-CM

## 2012-03-15 DIAGNOSIS — R195 Other fecal abnormalities: Secondary | ICD-10-CM

## 2012-03-15 LAB — CBC
MCH: 31.8 pg (ref 26.0–34.0)
MCHC: 32.4 g/dL (ref 30.0–36.0)
MCHC: 32.5 g/dL (ref 30.0–36.0)
MCV: 98 fL (ref 78.0–100.0)
Platelets: 264 10*3/uL (ref 150–400)
Platelets: 273 10*3/uL (ref 150–400)
RDW: 15.6 % — ABNORMAL HIGH (ref 11.5–15.5)
RDW: 15.3 % (ref 11.5–15.5)
WBC: 7.9 10*3/uL (ref 4.0–10.5)

## 2012-03-15 LAB — ALDOSTERONE + RENIN ACTIVITY W/ RATIO
Aldosterone: <1 ng/dL
PRA LC/MS/MS: 0.14 ng/mL/h — ABNORMAL LOW (ref 0.25–5.82)

## 2012-03-15 LAB — TYPE AND SCREEN
ABO/RH(D): O POS
Antibody Screen: NEGATIVE

## 2012-03-15 LAB — RENAL FUNCTION PANEL
Albumin: 1.4 g/dL — ABNORMAL LOW (ref 3.5–5.2)
BUN: 8 mg/dL (ref 6–23)
Calcium: 8.3 mg/dL — ABNORMAL LOW (ref 8.4–10.5)
Creatinine, Ser: 1.38 mg/dL — ABNORMAL HIGH (ref 0.50–1.10)
GFR calc non Af Amer: 44 mL/min — ABNORMAL LOW (ref >90)
Phosphorus: 5.2 mg/dL — ABNORMAL HIGH (ref 2.3–4.6)

## 2012-03-15 LAB — MAGNESIUM: Magnesium: 1.1 mg/dL — ABNORMAL LOW (ref 1.5–2.5)

## 2012-03-15 MED ORDER — MAGNESIUM SULFATE 40 MG/ML IJ SOLN
4.0000 g | Freq: Every day | INTRAMUSCULAR | Status: DC
  Administered 2012-03-15 – 2012-03-17 (×3): 4 g via INTRAVENOUS
  Filled 2012-03-15 (×3): qty 100

## 2012-03-15 MED ORDER — POTASSIUM CHLORIDE CRYS ER 20 MEQ PO TBCR
40.0000 meq | EXTENDED_RELEASE_TABLET | Freq: Two times a day (BID) | ORAL | Status: DC
  Administered 2012-03-15 – 2012-03-17 (×5): 40 meq via ORAL
  Filled 2012-03-15 (×6): qty 2

## 2012-03-15 NOTE — Progress Notes (Signed)
Clinical Child psychotherapist (CSW) continues to follow and communicate with both pt sister and facility regarding pt dc status. CSW will facilitate with dc when pt stabilizes. Plan remains for pt to dc to [facility] when stable.  Theresia Bough, MSW, Theresia Majors [phone]

## 2012-03-15 NOTE — Progress Notes (Signed)
[facility] Daily Rounding Note 03/15/2012, 12:50 PM  SUBJECTIVE:       Teaching Service requests GI to return to evaluate dropping H & H.  Stool has tested FOB + once on 7/23 and is brown and loose. Pt's mental status improving, reported to be A & O x 3 today. She is day 15 of admission. Initially evaluated by [place] for question of choledocholithiasis but felt high risk for sedation for ERCP or EGD  Hgb 7.4 on admission 7/18, nadir of 6.7 on 7/22.  Max level of 9.6 on 7/25.  Recent 3 day trend to today's 7.0 posted below. No thrombocytopenia or coagulopathy.   Got 2 units PRBCs on 7/23. She is eating well:  70 - 80% on last 3 meals.  No nausea or belly pain. Not dizzy but still weak   OBJECTIVE:         Vital signs in last 24 hours:    Temp:  [98.7 F (37.1 C)-99.2 F (37.3 C)] 99.2 F (37.3 C) (08/02 0621) Pulse Rate:  [88-99] 99  (08/02 0854) Resp:  [16-19] 16  (08/02 0854) BP: (135-161)/(83-95) 136/88 mmHg (08/02 0854) SpO2:  [97 %-100 %] 99 % (08/02 0854) Weight:  [95 lb 4.8 oz (43.228 kg)] 95 lb 4.8 oz (43.228 kg) (08/02 0621) Last BM Date: 03/12/12 General: thin, cachectic AAF   Heart: RRR.  No MRG Chest: dry crackles on left.  No cough or dyspnea Abdomen: soft, thin, no mass, no organomegaly.  BS active Rectal:  Pudding consistency stool incontinence.  Medium brown, FOB negative.   Extremities: no pedal edema Neuro/Psych:  Pleasant, cooperative, no overtly confused. Follows directions and appropriate.   Intake/Output from previous day: 08/01 0701 - 08/02 0700 In: 3320 [P.O.:820; I.V.:2500] Out: 2525 [Urine:2525]  Intake/Output this shift: Total I/O In: 320 [P.O.:320] Out: 725 [Urine:725]  Lab Results:  Basename 03/15/12 0530 03/14/12 2053 03/14/12 0500  WBC 7.9 8.0 7.1  HGB 7.0* 7.7* 7.4*  HCT 21.6* 23.2* 22.5*  PLT 264 275 284  MCV           97.2  BMET  Basename 03/15/12 0530 03/14/12 0500 03/13/12 0500  NA 139 139 138  K 3.3* 4.4 5.5*  CL 106 106 105    CO2 23 22 21   GLUCOSE 108* 77 88  BUN 8 10 9   CREATININE 1.38* 1.62* 1.51*  CALCIUM 8.3* 8.9 8.9   LFT  Basename 03/15/12 0530  PROT --  ALBUMIN 1.4*  AST --  ALT --  ALKPHOS --  BILITOT --  BILIDIR --  IBILI --   PT/INR  Basename 03/13/12 0500  LABPROT 14.9  INR 1.15   CT abdomen   02/29/2012 IMPRESSION:  Dense left upper lobe and superior segment left lower lobe consolidation, most  compatible with pneumonia. Collapse with proximal endobronchial lesion is felt  less likely. Gallstones with possible choledocholithiasis or volume averaging  from pancreatic calcification. Consider MRCP for further evaluation.  Hepatic steatosis.  Trace upper abdominal ascites.  Mr Roe Coombs W/wo Cm/mrcp  03/01/12 Exam is moderate to severely degraded by motion artifact and patient's inability to hold breath IMPRESSION: 1. Moderate to severely degraded exam, secondary to motion and inability to hold breath. 2. No biliary ductal dilatation. Distal common duct findings, which when combined with CT findings, are most consistent with small distal common duct stones. Concurrent cholelithiasis, as on CT. 3. Findings of chronic pancreatitis, without definite acute superimposed process. 4. Marked hepatic steatosis. 5. Left  base air space disease and adjacent fluid, as before. 6. Renal and perirenal edema with suggestion of decreased contrast excretion on delayed images. Question acute or subacute renal insufficiency. Original Report Authenticated By: Consuello Bossier, M.D   ASSESMENT: *  Chronic anemia.  EGD deferred due to sedation risk with her severe PNA *  Single FOB + stool. *  Diarrhea.  This could be s/e of the 80 meq of po potassium, the Augmentin or tid feeding supplements she takes daily. *  Hepatic steatosis *  Gallstones, possible distal CBD stones. LFTs minimaly elevated on admission (AST 53 - 60, T Bili 1.4 - 1.6, otherwise normal) but quickly normalized.   *  alcoholism *  Chronic calcific  pancreatitis.  Currently denies abdominal pain. *  Cavitary pneumonia.  On augmentin *  Malnutrition, improving.  *  Remote breast cancer    PLAN: *  Per dr Christella Hartigan *  Pt eating, drinking well, fluids still at 100/hour which will cause hemodilution.  I decreased these to 20 cc/hour.  If renal function worsens, can increase fluids.  *  Add Protonix vs Pepcid empirically?     LOS: 15 days   [name]  03/15/2012, 12:50 PM Pager: [phone]   ________________________________________________________________________  [facility] MD note:  I personally examined the patient, reviewed the data and agree with the assessment and plan described above.  No plans for endoscopic testing for Heme + stool.  This is most likely from slow dilution, blood draws, acute illness.  I think she should be transfused as needed.   Would reconsider endoscopic tests if she has signficant, overt bleeding of course.   Rob Bunting, MD [facility] Pager [phone]

## 2012-03-15 NOTE — Progress Notes (Signed)
Physical Therapy Treatment Patient Details Name: [name] MRN: [id number] DOB: 11/29/1960 Today's Date: 03/15/2012 Time: 6213-0865 PT Time Calculation (min): 15 min  PT Assessment / Plan / Recommendation Comments on Treatment Session  Pt cont's to make progress with steadiness & balance.      Follow Up Recommendations  Skilled nursing facility;Supervision/Assistance - 24 hour    Barriers to Discharge        Equipment Recommendations  Defer to next venue    Recommendations for Other Services    Frequency Min 3X/week   Plan Discharge plan remains appropriate;Frequency remains appropriate    Precautions / Restrictions Precautions Precautions: Fall Restrictions Weight Bearing Restrictions: No       Mobility  Bed Mobility Bed Mobility: Supine to Sit;Sitting - Scoot to Edge of Bed Supine to Sit: 6: Modified independent (Device/Increase time) Sitting - Scoot to Edge of Bed: 6: Modified independent (Device/Increase time) Transfers Transfers: Sit to Stand;Stand to Sit Sit to Stand: 5: Supervision;With upper extremity assist;From bed Stand to Sit: 5: Supervision;With upper extremity assist;To bed Ambulation/Gait Ambulation/Gait Assistance: 4: Min guard;4: Min assist Ambulation Distance (Feet): 300 Feet Assistive device: None Ambulation/Gait Assistance Details: Min assist for balance with gait challenges such as gait speed changes, directional changes, retropulsion.  Pt with improved stride & steadiness with straight aways.   Gait Pattern: Step-through pattern Stairs: No Wheelchair Mobility Wheelchair Mobility: No    Exercises     PT Diagnosis:    PT Problem List:   PT Treatment Interventions:     PT Goals Acute Rehab PT Goals Time For Goal Achievement: 03/15/12 Potential to Achieve Goals: Good PT Goal: Sit to Supine/Side - Progress: Met PT Goal: Sit to Stand - Progress: Progressing toward goal PT Goal: Stand to Sit - Progress: Progressing toward goal PT Goal:  Ambulate - Progress: Progressing toward goal  Visit Information  Last PT Received On: 03/15/12 Assistance Needed: +1    Subjective Data      Cognition  Overall Cognitive Status: Impaired Area of Impairment: Safety/judgement;Awareness of errors;Awareness of deficits Arousal/Alertness: Awake/alert Behavior During Session: Flat affect Safety/Judgement: Decreased awareness of safety precautions;Decreased awareness of need for assistance Awareness of Deficits: Pt unaware of balance deficits and fall risk.    Balance  Static Standing Balance Static Standing - Balance Support: No upper extremity supported Static Standing - Level of Assistance: 5: Stand by assistance Dynamic Standing Balance Dynamic Standing - Balance Support: No upper extremity supported Dynamic Standing - Level of Assistance: 5: Stand by assistance Dynamic Standing - Comments: 360 degree turns with min assist, picking object up off floor, forward weight shifting, lateral weight shifting.    End of Session PT - End of Session Equipment Utilized During Treatment: Gait belt Activity Tolerance: Patient tolerated treatment well Patient left:  (sitting EOB with sitter present) Nurse Communication: Mobility status     Verdell Face, [place] [phone] 03/15/2012

## 2012-03-15 NOTE — Progress Notes (Signed)
Subjective: The patient continued to have urinary retention yesterday, with a pvr of about 600 after voiding 100 cc.  A foley was placed yesterday evening, which has been draining freely.  The patient's creatinine has downtrended somewhat.  She notes no abd pain, nausea, vomiting.  Objective Vital signs in last 24 hours: Filed Vitals:   03/14/12 1451 03/14/12 2028 03/15/12 0621 03/15/12 0854  BP: 135/90 161/95 147/83 136/88  Pulse: 96 88 94 99  Temp: 98.7 F (37.1 C) 98.7 F (37.1 C) 99.2 F (37.3 C)   TempSrc: Oral Oral Oral   Resp: 19 18 18 16   Height:      Weight:   95 lb 4.8 oz (43.228 kg)   SpO2: 98% 100% 97% 99%   Weight change: -9.6 oz (-0.272 kg)  Intake/Output Summary (Last 24 hours) at 03/15/12 1007 Last data filed at 03/15/12 0804  Gross per 24 hour  Intake   3440 ml  Output   2525 ml  Net    915 ml   Labs: Basic Metabolic Panel:  Lab 03/15/12 1610 03/14/12 0500 03/13/12 0500 03/12/12 0555 03/11/12 0630 03/10/12 0540 03/09/12 0305  NA 139 139 138 141 143 143 141  K 3.3* 4.4 5.5* 4.8 4.3 4.0 3.7  CL 106 106 105 105 104 106 107  CO2 23 22 21 24 27 26 26   GLUCOSE 108* 77 88 78 87 82 116*  BUN 8 10 9 8  5* 5* 4*  CREATININE 1.38* 1.62* 1.51* 1.45* 1.31* 1.15* 0.93  ALB -- -- -- -- -- -- --  CALCIUM 8.3* 8.9 8.9 8.2* 8.3* 8.3* 8.0*  PHOS 5.2* -- -- -- -- -- --   Liver Function Tests:  Lab 03/15/12 0530 03/11/12 0630  AST -- 18  ALT -- 8  ALKPHOS -- 79  BILITOT -- 0.9  PROT -- 7.2  ALBUMIN 1.4* 1.5*    Lab 03/10/12 1313  AMMONIA 35   CBC:  Lab 03/15/12 0530 03/14/12 2053 03/14/12 0500 03/13/12 0500 03/11/12 0630  WBC 7.9 8.0 7.1 10.6* --  NEUTROABS -- -- -- -- 12.4*  HGB 7.0* 7.7* 7.4* 8.6* --  HCT 21.6* 23.2* 22.5* 26.1* --  MCV 98.6 98.7 100.4* 98.1 --  PLT 264 275 284 315 --    Studies/Results: US Renal  03/13/2012  *RADIOLOGY REPORT*  Clinical Data: Elevated creatinine.  RENAL/URINARY TRACT ULTRASOUND COMPLETE  Comparison:  MRI 03/01/2012   Findings:  Right Kidney:  12.3 cm.  Slightly increased echotexture.  No focal abnormality or hydronephrosis.  Left Kidney:  12.7 cm.  Slightly increased echotexture.  No focal abnormality or hydronephrosis.  Bladder:  Normal appearance.  IMPRESSION: Slight increased echotexture within the kidneys bilaterally.  This can be seen with chronic medical renal disease.  No hydronephrosis.  Original Report Authenticated By: Cyndie Chime, M.D.   Medications:    . sodium chloride 100 mL/hr at 03/15/12 0332      . amLODipine  10 mg Oral Daily  . amoxicillin-clavulanate  1 tablet Oral BID AC  . feeding supplement  237 mL Oral TID BM  . folic acid  1 mg Oral Daily  . metoprolol tartrate  25 mg Oral BID  . multivitamin with minerals  1 tablet Oral Daily  . pantoprazole  40 mg Oral Q1200  . potassium chloride  40 mEq Oral BID  . sodium chloride  3 mL Intravenous Q12H  . thiamine  100 mg Oral Daily   Or  . thiamine  100  mg Intravenous Daily  . tuberculin  5 Units Intradermal Once  . venlafaxine XR  150 mg Oral Daily    I  have reviewed scheduled and prn medications.  Physical Exam:  Blood pressure 136/88, pulse 99, temperature 99.2 F (37.3 C), temperature source Oral, resp. rate 16, height 5\' 7"  (1.702 m), weight 95 lb 4.8 oz (43.228 kg), SpO2 99.00%. General: alert, cooperative, and in no apparent distress, though with cognitive/memory deficits noted HEENT: pupils equal round and reactive to light, vision grossly intact, oropharynx clear and non-erythematous  Neck: supple, no lymphadenopathy, no JVD Lungs: clear to ascultation bilaterally, normal work of respiration, no wheezes, rales, ronchi Heart: regular rate and rhythm, no murmurs, gallops, or rubs Abdomen: soft, non-tender, non-distended, no suprapubic tenderness, normal bowel sounds  Extremities: no cyanosis, clubbing, or edema Neurologic: Cranial nerves II-XII intact, strength grossly intact, sensation intact to light  touch  Assessment/Plan  The patient is a 51 yo woman, history of HTN, alcoholism, presenting 7/18 with cavitary pneumonia, consulted for urinary retention and AKI.   # AKI - the patient's creatinine increased from her baseline of 0.7 to a maximum of 1.62, and has started to downtrend.  The etiology is unclear, but this most likely represents a TMP-induced rise in creatinine (Bactrim 7/29-7/30), which has likely not resolved due to subsequent development of urinary retention (see discussion below), and is now beginning to improve with discontinuation of bactrim and placement of foley. Differential also includes components of ACE-I use (lisinopril 7/25-7/29) vs AIN (Zosyn 7/18-7/31, HCTZ 7/21-7/30, less commonly protonix 7/18-8/1. However, no esosinophilia on CBC 7/29, no rash) vs diuretic use (HCTZ 7/21-7/30, unlikely given urinary retention). Unlikely contrast-induced ATN (03/05/12), as timing does not correlate. BP acutely dropped from 145/89 to 100/77 on 7/30, but this would likely not be low enough to cause ATN. Given that she has 2 recent UA's with no proteinuria or hematuria, glomerulonephritis is extremely unlikely.  -continue IVF  -continue foley catheter until Creatinine is closer to baseline   # Urinary Retention - The patient has a 1-day "documented" history of urinary retention.  May have been going on for several days as pt with symptoms of difficulty starting stream and mild sp tenderness only when bladder volume was as high as 1000 ml - so really don't know how long has been an issue) Differential includes medication-induced (significant ativan usage, though held 7/28-7/30. No anticholinergic usage) vs urethral diverticula vs neurogenic etiology (less likely). Unlikely UTI (negative UA) vs urethral stricture (acute onset).  -continue foley catheter for now -May try a de-cath trial after Creatinine closer to baseline -if urinary retention persists at that time, consider urology consult for  further evaluation (ie cystoscopy)   # LUL Pneumonia - responding to Augmentin  -management per primary team   # Mental Status - waxing and waning confusion, unclear etiology  # HTN - well-controlled   # Anemia - Stable, Ferritin 2724, iron 10, TIBC: 78, Saturation Ratio: 13.   # Alcohol Abuse - MVI, folate, thiamine   Signed, Janalyn Harder, PGY2 Pgr. [phone] 03/15/2012, 10:07 AM  I have seen and examined this patient and agree with plan as outlined above.  All nephrotoxins are out of the picture, and BP is stable.  If renal function continues to improve with foley drainage would definitely get urology involved as we cannot come up with satisfactory explanation for this degree of urinary retention. Kaden Daughdrill B,MD 03/15/2012 1:17 PM

## 2012-03-15 NOTE — Progress Notes (Signed)
Utilization Review Completed.  

## 2012-03-15 NOTE — Progress Notes (Signed)
OT Cancellation Note  Treatment cancelled today due to first attempt (9:47) pt had just finished with PT and wanted to wait until later, then on second attempt (14:50) pt with family that had just arrived. Will re-attempt on 03/18/2012).  Evette Georges [phone] 03/15/2012, 2:51 PM

## 2012-03-15 NOTE — Plan of Care (Signed)
Problem: Phase III Progression Outcomes Goal: Foley discontinued Outcome: Progressing Reinserted Mar 14, 2012 7am -7pm shift

## 2012-03-15 NOTE — Progress Notes (Signed)
Family Medicine Teaching Service Daily Progress Note Intern Pager: [phone]  Patient name: [name] Medical record number: [id number] Date of birth: 1961/01/11 Age: 51 y.o. Gender: female  Primary Care Provider: MATTHEWS,CODY, DO  Subjective: Pt feels well today and has been up walking with assistance and tolerating PO; per nurse, mental status is clearing daily.  Denies CP, SOB, abdominal pain, LOC. Does report some lightheadedness x a few days and diarrhea 5x yesterday that is black/tarry, last one at 9:30 pm.  Objective: Temp:  [98.7 F (37.1 C)-99.2 F (37.3 C)] 99.2 F (37.3 C) (08/02 0621) Pulse Rate:  [88-99] 99  (08/02 0854) Resp:  [16-19] 16  (08/02 0854) BP: (135-161)/(83-95) 136/88 mmHg (08/02 0854) SpO2:  [97 %-100 %] 99 % (08/02 0854) Weight:  [95 lb 4.8 oz (43.228 kg)] 95 lb 4.8 oz (43.228 kg) (08/02 1478) Exam: General: NAD; sitting at bedside; alert and oriented x 3 (but referred to calendar on wall), speech clear, answering questions appropriately Cardiovascular: RRR, no murmur appreciated; 2+ DP and radial pulses bilaterally Respiratory: good inspiratory effort, no increased work of breathing, right lower fields clear to auscultation, right upper lobe and left lung with scattered coarse crackles worse in upper fields, but overall continues improving. Abdomen: soft, nontender, non-distended, NABS Extremities: previously, occasional coarse intention tremor, mostly in arms; not present at this time  Laboratory:   Lab 03/15/12 0530 03/14/12 2053 03/14/12 0500  WBC 7.9 8.0 7.1  HGB 7.0* 7.7* 7.4*  HCT 21.6* 23.2* 22.5*  PLT 264 275 284    Lab 03/15/12 0530 03/14/12 0500 03/13/12 0500 03/11/12 0630  NA 139 139 138 --  K 3.3* 4.4 5.5* --  CL 106 106 105 --  CO2 23 22 21  --  BUN 8 10 9  --  CREATININE 1.38* 1.62* 1.51* --  CALCIUM 8.3* 8.9 8.9 --  PROT -- -- -- 7.2  BILITOT -- -- -- 0.9  ALKPHOS -- -- -- 79  ALT -- -- -- 8  AST -- -- -- 18  GLUCOSE  108* 77 88 --    Lab 03/13/12 0500  INR 1.15   HIV: nonreactive, 7/18 Fecal occult blood:  Negative on 7/20  POSITIVE on 7/23  I&O - 2956:2130 past 24 hours  Imaging/Diagnostic Tests - None new today  CXR, 7/18 @1424  IMPRESSION:  Dense pneumonia in the left lung, probably the left upper lobe.  CT, 7/18 @2028  IMPRESSION:  Dense left upper lobe and superior segment left lower lobe  consolidation, most compatible with pneumonia. Collapse with  proximal endobronchial lesion is felt less likely.  Gallstones with possible choledocholithiasis or volume averaging  from pancreatic calcification. Consider MRCP for further  evaluation.  Hepatic steatosis.  Trace upper abdominal ascites.  MRCP, 7/19 @1243  IMPRESSION:  1. Moderate to severely degraded exam, secondary to motion and  inability to hold breath.  2. No biliary ductal dilatation. Distal common duct findings,  which when combined with CT findings, are most consistent with  small distal common duct stones. Concurrent cholelithiasis, as on CT.  3. Findings of chronic pancreatitis, without definite acute  superimposed process.  4. Marked hepatic steatosis.  5. Left base air space disease and adjacent fluid, as before.  6. Renal and perirenal edema with suggestion of decreased contrast  excretion on delayed images. Question acute or subacute renal  Insufficiency.  CXR, 7/21 IMPRESSION:  1. Significantly worsening aeration throughout the left lung,  compatible with progressive multilobar pneumonia.  2. New small bilateral pleural effusions.  3. Atherosclerosis.  CT Chest, 7/23 @1520  IMPRESSION:  1. Progressive multifocal pneumonia on the left with  consolidation, cavitation and lobar expansion. These features are  most suggestive of infection with a gram-negative organism  (Klebsiella, E Coli or Pseudomonas).  2. Associated small right greater than left pleural effusions and  probable reactive mediastinal  lymphadenopathy.  CT Head, 7/26 @1331  IMPRESSION:  1. No acute intracranial abnormality. No acute traumatic injury  identified.  2. Age advanced global atrophy.  CXR, 7/28 @1402  IMPRESSION:  Right lung is clear. Persistent consolidation with air bronchogram  and cavitation in the left upper lobe consistent with pneumonia.  Slight improvement in aeration left lower lobe.  Renal US, 7/31 @1116  IMPRESSION:  Slight increased echotexture within the kidneys bilaterally. This  can be seen with chronic medical renal disease. No hydronephrosis.  Assessment/Plan: [name] is a 51yo female with PMH significant for HTN, EtOH abuse/alcoholic hepatitis, and breast CA (2001, s/p tx) presenting with inability to tolerate PO with shortness of breath. Found on admission to have significant LUL pneumonia; also with multiple interrelated issues (HTN/tachycardia, electrolyte abnormalities, acute kidney injury). Admitted from clinic on 7/18. Currently with urinary retention of unknown origin.  1. Shortness of breath/Cough: CXR/CT findings PNA of LUL with cavitation.  DDx includes aspiration pna with h/o alcohol abuse, TB.  Subjective fevers at home, afebrile since admission. PPD negative on 7/22. Vancomycin added 7/21 as pt was not improving on Zosyn + azithromycin. Pulmonology (Dr. Molli Knock) consulted 7/25 for evidence of cavitary PNA with little clinical improvement. Respiratory culture shows only few yeast, compatible with Candida, ?contaminant. Vancomycin discontinued 7/29 due to kidney function, switched to Bactrim for MRSA coverage. Abx discontinued 7/30 and ID consulted, recommending Augmentin 500-125 BID for three weeks of total therapy (8/8 last day). Lung exams continue to slowly improve. WBC stable, 7.9 today. - Continue Augmentin BID (last day of dosing 8/8) for 3 weeks of total therapy. (Pt received abx for 12 total days, MRSA coverage for 10, Zosyn for 12, Azithro for 3, Vanc for 9, Bactrim for  1). - Repeat CXR 2-3 weeks as outpatient - No increased O2 reqmt or fevers to date - continue to monitor - Incentive spirometry.   2. Mental status/tremor - Waxing and waning confusion/reduced responsiveness, coarse tremor on exam especially with movement, and global atrophy on CT 7/26 s/p fall. Also with new complaint of diarrhea, per nursing. Given history of heavy alcohol use and little apparent clinical improvement of pt's pneumonia in addition to the above symptoms, neurology was consulted, 7/28 (Dr. Lu Duffel). CXR improved on 7/28 slightly from previous days. EKG without changed, lactic acid slightly increased, ammonia normal. UA negative but pt has been on abx for multiple days.  Blood cultures show no growth to date and Legionella negative. 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 Ativan scheduled has been discontinued as of 7/30. Mental status still with some slight waxing and waning but overall much improved past 2 days. - Avoid Haldol (last QTc ~460), but may order Ativan 0.5 mg PRN q8 for agitation per neuro recs; would avoid meds completely if possible. Sitter in room, as pt is a fall risk, as well.   3. Acute kidney injury - Pt very thin with little muscle mass, with initial Cr of 1.13, trended down to 0.47 with IVF and improved hydration. Slow increase since that time, to 1.45 on 7/30. Etiology unclear, probably multifactorial; pt has been on vancomycin (7/21-7/29) and Bactrim (7/29-7/30), lisinopril  restarted 7/25 and dosage increased 7/28; last IV contrast admin 7/23. Pt also complaining of intermittent diarrhea and on diuretic (HCTZ) with generally poor PO intake. Nephrotoxic drugs d/c'd and held on 7/30. Nephrology 7/31 (Dr. Eliott Nine), recommended random vanc level to see if med is still present.  Renal US shows mild increased echogenicity; no apparent evidence of glomerulonephropathy. Cr mildly elevated 7/30 --> 8.1 to 1.62, now 1.38 - Continue to avoid  nephrotoxins and monitor Cr and strict I:O for oliguria.  - Nephrology c/s [facility]) recs - continue foley catheter until creatinine improves - likely keep in overnight with voiding trial tomorrow  - Restarted oral K 40 meq BID today - Renal panel in AM  - stopped protonix per renal recs that it could improve Cr - Will likely recommend checking Cr 1 week after discharge.  4. Urinary retention - New on 7/31, pt with 1000 mL on in-and-out cath and 830 mL average post-void residual for the prior three voids. Uncertain etiology; pt does not appear to be on any anticholinergic medications. UA on 7/28 and on 7/31 unremarkable, pt has been on extensive abx course for PNA, so UTI seems less unlikely. No evidence of urinary obstruction on renal US.  Post-void residual with 640cc 7/31, inserted foley, then failed voiding trial and reinserted foley. - Appreciate further renal c/s recommendations for etiology and management. - For now, keep foley in and ?attempt voiding trial tomorrow - Strict I&O - Consider urology c/s prior to discharge, possible discharge with foley  5. HTN: Slightly low BP on admission, anti-hypertensives were held, then intermittent elevated pressures. Home HCTZ restarted on 7/21, restarted lisinopril 7/25; lisinopril increased to 40 mg daily on 7/27. Lisinopril stopped 7/29 due to elevated creatinine, switched to Norvasc 10. BP remains elevated but improved, 147/83 now, with tachycardia now only intermittently. - Continue Norvasc 10 BID, started 7/29. - Started metoprolol 25 mg BID on 7/30.  - Hydralazine PRN for BP >180/100.  6. Anemia - Most likely mixed vitamin/iron deficiencies secondary to EtOH abuse and general poor PO intake; MCV near upper limit of normal with one measurement 101.6, and iron panel on 7/21 shows low iron and TIBC but elevation of ferritin; pt likely with iron metabolism derangement secondary to alcoholism. Hb 11.0 in April 2013, 8.5 on admission  this time. Trended down to 6.9 on 7/19. Now 7.6 on 7/20. Oral iron started 7/21, but discontinued 7/23; we do not want to iron overload her.  Stool initially were hemoccult negative, no bloody emesis. Stool hemoccult positive on 7/23. Hb 5.6, pt transfused 2 units on 7/24 with Hb rise to 9.5. Hb with slight slow drift down again, 7/26 9.0 --> 7/27 8.7 --> 7/28 8.5. Stable 7/29 at 8.6, 7/30 at 8.3, 7/31 at 8.6, 7.7 on 8/1, dropped to 7 on 8/2; Drift may be at least partially due to extensive lab draws though continuing to drop and patient reporting melanotic stool.   - Typed and crossed today in case needs transfusion - f/u 2 pm CBC, transfuse if Hgb continuing to drop - f/u FOBT - [facility] re-consulted - appreciate further recommendations - f/u AM CBC - Continue multivitamin, folate, thiamine.   7. Tachycardia: Initially believed likely secondary to dehydration given hx of inability to tolerate PO, no evidence of PE since admission. Also considered possible EtOH withdrawal and/or component of anemia, as above. Given elevated BP, DDx could also include more exotic causes such as carcinoid, pheochromocytoma, etc. Intermittent pulse rate in normal  range around 80-90's, now more steady in normal range after adding metoprolol 7/30. Monitor with other signs/symptoms.  94 today  8. Abdominal pain/anorexia: Patient with history of EtOH abuse and EtOH induced hepatitis, initially presented with abdominal pain, icterus and inability to tolerate PO. Broad differential including pancreatitis, hepatitis, malignancy, gallstones, gastritis, antibiotic side effect. Admitted to telemetry and for hydration. CT shows LUL PNA, gallstones with possible choledocolithiasis and hepatic steatosis with trace upper abd ascites. MRCP on 7/19 is a generally poor study, shows current cholelithiasis and chronic pancreatitis and nonspecific distal common duct findings. Abdominal pain appears resolved; some complaints diarrhea  intermittently.  C.diff negative 7/28. Stool cultures show yeast likely due to reduced normal flora.  - PO yogurt to improve GI flora - Immodium PRN, one dose yesterday afternoon.  - Will monitor clinically; hopefully will improve as abx come off.  - Protonix daily d/c'ed today per renal recommendations  - Cont. Zofran PRN for nausea.  - GI consulted-  Appreciate recs  9. EtOH abuse: Last drink reported >3 days prior to admission, deemed unlikely to withdraw if she has truly not had anything to drink more recently. CIWA protocol initiated due to clinical appearance, requiring intermittent dosing 7/19-7/27. Pt was started on scheduled Ativan on 7/22. CIWA last renewed 7/24; pt received 4 doses on 7/26-7/27 overnight, probably inappropriately as pt is well outside the window for EtOH withdrawal and has had tremor, tachycardia, and elevated BP since admission; CIWA discontinued 7/27. - Continue to avoid sedating meds, but will address agitation as appropriate with PRNs.  - Multivitamin, folic acid, thiamine.  10. Depression: Has expressed passive SI in the past as well as in clinic on 7/18. No plan vocalized. Will continue home SSRI and monitor closely. Possible contribution to elevated BP by Effexor; may consider changing.  No SI/ HI today.  11. PT/OT - Updated notes reflect recommendation for SNF placement.  CSW actively involved and pt agreeable. - Assistance and recommendations from all sides is appreciated.  FEN/GI: Dys 3 diet, add yogurt; NS @ 75 mL/hr. MVI with thiamine and folic acid; Multiple K and Mg replenishments throughout stay; will continue as appropriate.  - K: 3.3 today-  Re-added KCl PO 40 Meq BID today;  PPX: Lovenox d/ced 7/23 as FOBT is positive, SCD's only for now. Protonix d/c'ed Dispo: Pending clinical improvementely to SNF; Outpatient CXR to re-evaluate pneumonia; Consider outpatient GI; outpatient urology f/u to evaluate retention  Leona Singleton, MD PGY-1, [facility] Family Medicine FPTS Intern pager: [phone]

## 2012-03-15 NOTE — Progress Notes (Signed)
FMTS Attending Daily Note:  Renold Don MD  [phone] pager  Family Practice pager:  [phone] I have seen and examined this patient and have reviewed their chart. I have discussed this patient with the resident. I agree with the resident's findings, assessment and care plan.  Ms Bobst continues to improve on all fronts (PNA, mental status, creatinine) except for dropping Hgb.  GI has been consulted today, appreciate input.  FOB pending.  Concern is of course for microscopic GI bleed.  She does report history of melena, but memory obviously poor.   - Appreciate Renal input.  Likely not trimethroprim induced AKI as creatinine doubled (she has little muscle mass and likely has very low baseline creatinine) from 7/25 to 7/27.  Recent dye load in presence of increased dose of ACE inhibitor just days before.  Hopefully this is all academic as creatinine continues to drop with Foley placement.  As mentioned, will need urology consult if fails trial of voiding once Foley removed.

## 2012-03-16 LAB — CULTURE, BLOOD (ROUTINE X 2): Culture: NO GROWTH

## 2012-03-16 LAB — CBC
HCT: 22.4 % — ABNORMAL LOW (ref 36.0–46.0)
MCHC: 33.9 g/dL (ref 30.0–36.0)
Platelets: 268 10*3/uL (ref 150–400)
RDW: 15.4 % (ref 11.5–15.5)
WBC: 7.8 10*3/uL (ref 4.0–10.5)

## 2012-03-16 LAB — RENAL FUNCTION PANEL
Albumin: 1.7 g/dL — ABNORMAL LOW (ref 3.5–5.2)
BUN: 10 mg/dL (ref 6–23)
GFR calc non Af Amer: 58 mL/min — ABNORMAL LOW (ref >90)
Phosphorus: 4.2 mg/dL (ref 2.3–4.6)
Potassium: 4.5 mEq/L (ref 3.5–5.1)
Sodium: 139 mEq/L (ref 135–145)

## 2012-03-16 MED ORDER — AMLODIPINE BESYLATE 10 MG PO TABS: 10.0000 mg | ORAL_TABLET | Freq: Every day | ORAL | Status: DC

## 2012-03-16 MED ORDER — CLOTRIMAZOLE 1 % VA CREA: 1.0000 | TOPICAL_CREAM | Freq: Two times a day (BID) | VAGINAL | Status: DC | PRN

## 2012-03-16 MED ORDER — ADULT MULTIVITAMIN W/MINERALS CH: 1.0000 | ORAL_TABLET | Freq: Every day | ORAL | Status: DC

## 2012-03-16 MED ORDER — ENSURE PUDDING PO PUDG: 1.0000 | Freq: Every day | ORAL | Status: DC

## 2012-03-16 MED ORDER — METOPROLOL TARTRATE 25 MG PO TABS: 25.0000 mg | ORAL_TABLET | Freq: Two times a day (BID) | ORAL | Status: DC

## 2012-03-16 MED ORDER — ENSURE COMPLETE PO LIQD: 237.0000 mL | Freq: Three times a day (TID) | ORAL | Status: DC

## 2012-03-16 MED ORDER — CLOTRIMAZOLE 1 % VA CREA: 1.0000 | TOPICAL_CREAM | Freq: Two times a day (BID) | VAGINAL | Status: AC | PRN

## 2012-03-16 MED ORDER — ONDANSETRON HCL 4 MG PO TABS: 4.0000 mg | ORAL_TABLET | Freq: Three times a day (TID) | ORAL | Status: DC | PRN

## 2012-03-16 MED ORDER — THIAMINE HCL 100 MG PO TABS: 100.0000 mg | ORAL_TABLET | Freq: Every day | ORAL | Status: DC

## 2012-03-16 MED ORDER — FOLIC ACID 1 MG PO TABS: 1.0000 mg | ORAL_TABLET | Freq: Every day | ORAL | Status: DC

## 2012-03-16 MED ORDER — AMOXICILLIN-POT CLAVULANATE 500-125 MG PO TABS: 1.0000 | ORAL_TABLET | Freq: Two times a day (BID) | ORAL | Status: DC

## 2012-03-16 MED ORDER — LORAZEPAM 0.5 MG PO TABS: 0.5000 mg | ORAL_TABLET | Freq: Three times a day (TID) | ORAL | Status: DC | PRN

## 2012-03-16 MED ORDER — ENSURE PUDDING PO PUDG
1.0000 | Freq: Every day | ORAL | Status: DC
  Administered 2012-03-16 – 2012-03-17 (×2): 1 via ORAL

## 2012-03-16 NOTE — Progress Notes (Signed)
Pt had sitter for safety . Sitter now stopped. Pt much more alert and cooperative today. Marisa Cyphers RN

## 2012-03-16 NOTE — Progress Notes (Signed)
FMTS Attending Daily Note:  Renold Don MD  [phone] pager  Family Practice pager:  [phone] I have seen and examined this patient and have reviewed their chart. I have discussed this patient with the resident. I agree with the resident's findings, assessment and care plan.  Creatinine continues to trend down well.  Hgb stabilized.  Trial of voiding today.  If able to void and no signs of urinary retention, DC back to assisted living today.  However may require Urology consult if unable to void.

## 2012-03-16 NOTE — Progress Notes (Signed)
Pt up to BR voided 50 cc PVR bladder scan average of 3 is 190 ml. Marisa Cyphers RN

## 2012-03-16 NOTE — Progress Notes (Signed)
1810 Pt voided 150 ml - PVR 230 At 1835 pt voided additional 150 ml. Marisa Cyphers RN

## 2012-03-16 NOTE — Progress Notes (Signed)
Family Medicine Teaching Service Daily Progress Note Intern Pager: [phone]  Patient name: [name] Medical record number: [id number] Date of birth: 30-Sep-1960 Age: 51 y.o. Gender: female  Primary Care Provider: MATTHEWS,CODY, DO  Subjective: Pt feels well this morning, denies nausea, vomiting, AH, VH, CP/SOB but mild cough; taking good PO per pt, takes entire day to drink 1 ensure per RN.   Objective: Temp:  [97.8 F (36.6 C)-98.4 F (36.9 C)] 98.4 F (36.9 C) (08/03 0500) Pulse Rate:  [81-92] 92  (08/03 0500) Resp:  [18] 18  (08/03 0500) BP: (138-155)/(87-88) 142/88 mmHg (08/03 0500) SpO2:  [98 %-100 %] 98 % (08/03 0500) on RA I:O = [id number] Exam: General: NAD, smiling, interactive; lying in bed; alert and oriented x 3, speech clear, answering questions appropriately Cardiovascular: RRR, no murmur appreciated; 2+ radial pulses bilaterally Respiratory: good inspiratory effort, no increased work of breathing, right lower fields clear to auscultation, right upper lobe and left lung with scattered coarse crackles worse in upper fields, left worse than right; but overall continues improving. Abdomen: soft, nontender, non-distended, NABS Extremities: previously, occasional coarse intention tremor, mostly in arms; not present at this time; PICC line in place Foley catheter in place and draining  Laboratory:   Lab 03/16/12 0505 03/15/12 1307 03/15/12 0530  WBC 7.8 8.0 7.9  HGB 7.6* 7.8* 7.0*  HCT 22.4* 24.0* 21.6*  PLT 268 273 264    Lab 03/15/12 0530 03/14/12 0500 03/13/12 0500 03/11/12 0630  NA 139 139 138 --  K 3.3* 4.4 5.5* --  CL 106 106 105 --  CO2 23 22 21  --  BUN 8 10 9  --  CREATININE 1.38* 1.62* 1.51* --  CALCIUM 8.3* 8.9 8.9 --  PROT -- -- -- 7.2  BILITOT -- -- -- 0.9  ALKPHOS -- -- -- 79  ALT -- -- -- 8  AST -- -- -- 18  GLUCOSE 108* 77 88 --    Lab 03/13/12 0500  INR 1.15   HIV: nonreactive, 7/18 Fecal occult blood:  Negative on 7/20  POSITIVE  on 7/23  Imaging/Diagnostic Tests - None new today  CXR, 7/18 @1424  IMPRESSION:  Dense pneumonia in the left lung, probably the left upper lobe.  CT, 7/18 @2028  IMPRESSION:  Dense left upper lobe and superior segment left lower lobe  consolidation, most compatible with pneumonia. Collapse with  proximal endobronchial lesion is felt less likely.  Gallstones with possible choledocholithiasis or volume averaging  from pancreatic calcification. Consider MRCP for further  evaluation.  Hepatic steatosis.  Trace upper abdominal ascites.  MRCP, 7/19 @1243  IMPRESSION:  1. Moderate to severely degraded exam, secondary to motion and  inability to hold breath.  2. No biliary ductal dilatation. Distal common duct findings,  which when combined with CT findings, are most consistent with  small distal common duct stones. Concurrent cholelithiasis, as on CT.  3. Findings of chronic pancreatitis, without definite acute  superimposed process.  4. Marked hepatic steatosis.  5. Left base air space disease and adjacent fluid, as before.  6. Renal and perirenal edema with suggestion of decreased contrast  excretion on delayed images. Question acute or subacute renal  Insufficiency.  CXR, 7/21 IMPRESSION:  1. Significantly worsening aeration throughout the left lung,  compatible with progressive multilobar pneumonia.  2. New small bilateral pleural effusions.  3. Atherosclerosis.  CT Chest, 7/23 @1520  IMPRESSION:  1. Progressive multifocal pneumonia on the left with  consolidation, cavitation and lobar expansion. These features are  most  suggestive of infection with a gram-negative organism  (Klebsiella, E Coli or Pseudomonas).  2. Associated small right greater than left pleural effusions and  probable reactive mediastinal lymphadenopathy.  CT Head, 7/26 @1331  IMPRESSION:  1. No acute intracranial abnormality. No acute traumatic injury  identified.  2. Age advanced global  atrophy.  CXR, 7/28 @1402  IMPRESSION:  Right lung is clear. Persistent consolidation with air bronchogram  and cavitation in the left upper lobe consistent with pneumonia.  Slight improvement in aeration left lower lobe.  Renal US, 7/31 @1116  IMPRESSION:  Slight increased echotexture within the kidneys bilaterally. This  can be seen with chronic medical renal disease. No hydronephrosis.  Assessment/Plan: [name] is a 51yo female with HTN, EtOH abuse/alcoholic hepatitis, and breast CA (2001, s/p tx) presenting 7/18 from clinic with inability to tolerate PO with shortness of breath. Found to have significant LUL cavitary pneumonia; also with multiple interrelated issues (HTN/tachycardia, electrolyte abnormalities, AKI, anemia). Currently with urinary retention of unknown origin.  1. Shortness of breath/Cough: CXR/CT findings PNA of LUL with cavitation.  DDx aspiration pna with h/o alcohol abuse, TB.  Subjective fevers at home, afebrile since admission. PPD neg 7/22. Vanc added 7/21 as pt not improving on Zosyn + azithromycin. Pulm (Dr. Molli Knock) consulted 7/25 for evidence of cavitary PNA with little clinical improvement. Respiratory culture shows only few yeast, compatible with Candida, ?contaminant. Vancdiscontinued 7/29 due to kidney function, switched to Bactrim for MRSA coverage. Abx discontinued 7/30 and ID consulted, recs Augmentin 500-125 BID for 3 weeks of total tx (8/8 last day). Lung exams continue improving. WBC stable, 7.8 today. - Continue Augmentin BID (last day of dosing 8/8) for 3 weeks of total therapy. (Pt received abx for 12 total days, MRSA coverage for 10, Zosyn for 12, Azithro for 3, Vanc for 9, Bactrim for 1). - Repeat CXR 2-3 weeks as outpatient - No increased O2 reqmt or fevers to date - continue to monitor - Incentive spirometry.   2. Mental status/tremor - Waxing and waning confusion/reduced responsiveness, coarse tremor on exam especially with movement, and global  atrophy on CT 7/26 s/p fall. Also with new complaint of diarrhea, per nursing. Given history of heavy alcohol use and little apparent clinical improvement of pt's pneumonia in addition to the above symptoms, neurology was consulted, 7/28 (Dr. Lu Duffel). CXR improved on 7/28 slightly from previous days. EKG without changed, lactic acid slightly increased, ammonia normal. UA negative but pt has been on abx for multiple days.  Blood cultures show no growth to date and Legionella negative. 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 Ativan scheduled has been discontinued as of 7/30. Mental status still with some slight waxing and waning but overall much improved past 2 days. - Avoid Haldol (last QTc ~460), but may order Ativan 0.5 mg PRN q8 for agitation per neuro recs; would avoid meds completely if possible. Sitter in room, as pt is a fall risk, as well.  - Improving mental status - d/c sitter today  3. Acute kidney injury - Pt very thin with little muscle mass, with initial Cr of 1.13, trended down to 0.47 with IVF and improved hydration. Slow increase since that time, to 1.45 on 7/30. Etiology unclear, probably multifactorial; pt has been on vancomycin (7/21-7/29) and Bactrim (7/29-7/30), lisinopril restarted 7/25 and dosage increased 7/28; last IV contrast admin 7/23. Pt also complaining of intermittent diarrhea and on diuretic (HCTZ) with generally poor PO intake. Nephrotoxic drugs d/c'd and held on 7/30.  Nephrology 7/31 (Dr. Eliott Nine), recommended random vanc level to see if med is still present.  Renal US shows mild increased echogenicity; no apparent evidence of glomerulonephropathy. Cr mildly elevated 7/30 --> 8.2 to 1.38, pending today. - Continue to avoid nephrotoxins and monitor Cr and strict I:O for oliguria (none presently) - Nephrology c/s ([facility]) recs - continue foley catheter until creatinine improves - likely voiding trial today if Cr improved  (pending) - Restarted oral K 40 meq BID 8/2 - Renal panel pending (f/u Cr, K, Mg) - stopped protonix per renal recs that it could improve Cr - Will likely recommend checking Cr 1 week after discharge. - If cr worsens, increase IVF  4. Urinary retention - New on 7/31, pt with 1000 mL on in-and-out cath and 830 mL average post-void residual for the prior three voids. Uncertain etiology; pt does not appear to be on any anticholinergic medications. UA on 7/28 and on 7/31 unremarkable, pt has been on extensive abx course for PNA, so UTI seems less unlikely. No evidence of urinary obstruction on renal US.  Post-void residual with 640cc 7/31, inserted foley, then failed voiding trial and reinserted foley. - Appreciate further renal c/s recommendations for etiology and management. - For now, keep foley in and ?attempt voiding trial this afternoon - Strict I&O - Consider urology c/s if fail voiding trial today, possible discharge with foley  5. HTN: Slightly low BP on admission, anti-hypertensives were held, then intermittent elevated pressures. Home HCTZ restarted on 7/21, restarted lisinopril 7/25; lisinopril increased to 40 mg daily on 7/27. Lisinopril stopped 7/29 due to elevated creatinine, switched to Norvasc 10. BP remains elevated but improved, 147/83 now, with tachycardia now only intermittently. - Continue Norvasc 10 BID, started 7/29. - Started metoprolol 25 mg BID on 7/30.  - Hydralazine PRN for BP >180/100.  6. Anemia - Most likely mixed vitamin/iron deficiencies secondary to EtOH abuse and general poor PO intake, repeated blood draws, acute illness, hemodilation; MCV near upper limit of normal with one measurement 101.6, and iron panel on 7/21 shows low iron and TIBC but elevation of ferritin; pt likely with iron metabolism derangement secondary to alcoholism. Hb 11.0 in April 2013, 8.5 on admission this time. Trended down to 6.9 on 7/19. Now 7.6 on 7/20. Oral iron started 7/21, but  discontinued 7/23; we do not want to iron overload her.  Stool initially were hemoccult negative, no bloody emesis. Stool hemoccult positive on 7/23. Hb 5.6, pt transfused 2 units on 7/24 with Hb rise to 9.5. Hb with slight slow drift down again, 7/26 9.0 --> 7/27 8.7 --> 7/28 8.5. Stable 7/29 at 8.6, 7/30 at 8.3, 7/31 at 8.6, 7.7 on 8/1, dropped to 7 on 8/2; 7.6 on 8/3 Drift may be at least partially due to extensive lab draws though continuing to drop and patient reporting melanotic stool.   - Typed and crossed yesterday but transfusion was not needed - f/u FOBT - [place] re-consulted -rec no endoscopy unless significant overt bleeding; appreciate further recommendations - f/u AM CBC or sooner if becomes symptomatic - Continue multivitamin, folate, thiamine.   7. Tachycardia: Initially believed likely secondary to dehydration given hx of inability to tolerate PO, no evidence of PE since admission. Also considered possible EtOH withdrawal and/or component of anemia, as above. Given elevated BP, DDx could also include more exotic causes such as carcinoid, pheochromocytoma, etc. Intermittent pulse rate in normal range around 80-90's, now more steady in normal range after adding metoprolol  7/30. Monitor with other signs/symptoms.  92 today  8. Abdominal pain/anorexia: Patient with history of EtOH abuse and EtOH induced hepatitis, initially presented with abdominal pain, icterus and inability to tolerate PO. Broad differential including pancreatitis, hepatitis, malignancy, gallstones, gastritis, antibiotic side effect. Admitted to telemetry and for hydration. CT shows LUL PNA, gallstones with possible choledocolithiasis and hepatic steatosis with trace upper abd ascites. MRCP on 7/19 is a generally poor study, shows current cholelithiasis and chronic pancreatitis and nonspecific distal common duct findings. Abdominal pain appears resolved; some complaints diarrhea intermittently.  C.diff negative 7/28.  Stool cultures show yeast likely due to reduced normal flora.  - PO yogurt to improve GI flora - Immodium PRN, one dose yesterday afternoon.  - Will monitor clinically; hopefully will improve as abx come off.  - Protonix daily d/c'ed today per renal recommendations  - Cont. Zofran PRN for nausea.  - GI consulted-  Consider adding pepcid or readding protonix empirically; Appreciate further recs  9. EtOH abuse: Last drink reported >3 days prior to admission, deemed unlikely to withdraw if she has truly not had anything to drink more recently. CIWA protocol initiated due to clinical appearance, requiring intermittent dosing 7/19-7/27. Pt was started on scheduled Ativan on 7/22. CIWA last renewed 7/24; pt received 4 doses on 7/26-7/27 overnight, probably inappropriately as pt is well outside the window for EtOH withdrawal and has had tremor, tachycardia, and elevated BP since admission; CIWA discontinued 7/27. - Continue to avoid sedating meds, but will address agitation as appropriate with PRNs.  - Multivitamin, folic acid, thiamine.  10. Depression: Has expressed passive SI in the past as well as in clinic on 7/18. No plan vocalized. Will continue home SSRI and monitor closely. Possible contribution to elevated BP by Effexor; may consider changing.  No SI/ HI during hosp course.  11. PT/OT - Updated notes reflect recommendation for SNF placement.  CSW actively involved and pt agreeable. - Assistance and recommendations from all sides is appreciated.  FEN/GI: Dys 3 diet, add yogurt; NS @ 75 mL/hr. MVI with thiamine and folic acid; Multiple K and Mg replenishments throughout stay; will continue as appropriate.  - K: 3.3 8/2-  Re-added KCl PO 40 Meq BID;  PPX: Lovenox d/ced 7/23 as FOBT is positive, SCD's only for now. Protonix d/c'ed given possible effect on Cr Dispo: Pending clinical improvementely to SNF; Outpatient CXR to re-evaluate pneumonia; Consider outpatient GI; outpatient urology f/u to  evaluate retention  Leona Singleton, MD PGY-1, [facility] Family Medicine FPTS Intern pager: [phone]

## 2012-03-16 NOTE — Progress Notes (Addendum)
Subjective:  Patient has mild cough, No chest pain and SOB. She dose not have abd pain, nausea or vomiting. Afebrile.  Foley discontinued in AM (8/3), did not urinate yet. Objective Vital signs in last 24 hours: Filed Vitals:   03/15/12 2101 03/16/12 0500 03/16/12 1032 03/16/12 1033  BP: 138/87 142/88 148/90   Pulse: 90 92  94  Temp: 98.2 F (36.8 C) 98.4 F (36.9 C)    TempSrc: Oral Oral    Resp: 18 18    Height:      Weight:      SpO2: 100% 98%     Weight change:   Intake/Output Summary (Last 24 hours) at 03/16/12 1158 Last data filed at 03/16/12 1109  Gross per 24 hour  Intake 1718.33 ml  Output   2777 ml  Net -1058.67 ml   Urine: 3275 ml (8/3); 2000 ml (8/2)  BW: No weight today 03/15/12 0621 95 lb 4.8 oz (43.228 kg) 03/14/12 0543 95 lb 14.4 oz (43.5 kg) 03/13/12 0545 94 lb 12.8 oz (43 kg) 03/12/12 0319 95 lb 10.9 oz (43.4  03/11/12 0446 97 lb 10.6 oz (44.3 kg) 03/10/12 0416 106 lb 4.2 oz (48.2 kg) 03/09/12 0645 110 lb 7. 03/08/12 0445 110 lb 10.7 oz (50.2 kg) 03/07/12 0439 109 lb 8 oz (49.669 kg) 03/06/12 0530 115 lb 1.3 oz (52.2 kg) 03/05/12 0509 115 lb 8.3 oz (52.4 kg) 03/04/12 0500 109 lb 12.6 oz (49.8 kg) 03/03/12 0603 109 lb 2 oz (49.5 kg) 03/02/12 2353 108 lb 7.5 oz (49.2 kg) 03/02/12 0500 111 lb 12.8 oz (50.712 kg) 03/01/12 0557 108 lb 4.8 oz (49.125 kg)  Labs: Basic Metabolic Panel: no renal panel ordered for today - ordered now  Lab 03/15/12 0530 03/14/12 0500 03/13/12 0500 03/12/12 0555 03/11/12 0630 03/10/12 0540  NA 139 139 138 141 143 143  K 3.3* 4.4 5.5* 4.8 4.3 4.0  CL 106 106 105 105 104 106  CO2 23 22 21 24 27 26   GLUCOSE 108* 77 88 78 87 82  BUN 8 10 9 8  5* 5*  CREATININE 1.38* 1.62* 1.51* 1.45* 1.31* 1.15*  ALB -- -- -- -- -- --  CALCIUM 8.3* 8.9 8.9 8.2* 8.3* 8.3*  PHOS 5.2* -- -- -- -- --   Liver Function Tests:  Lab 03/15/12 0530 03/11/12 0630  AST -- 18  ALT -- 8  ALKPHOS -- 79  BILITOT -- 0.9  PROT -- 7.2  ALBUMIN  1.4* 1.5*    Lab 03/10/12 1313  AMMONIA 35   CBC:  Lab 03/16/12 0505 03/15/12 1307 03/15/12 0530 03/14/12 2053 03/11/12 0630  WBC 7.8 8.0 7.9 8.0 --  NEUTROABS -- -- -- -- 12.4*  HGB 7.6* 7.8* 7.0* 7.7* --  HCT 22.4* 24.0* 21.6* 23.2* --  MCV 97.8 98.0 98.6 98.7 --  PLT 268 273 264 275 --    Studies/Results: No results found. Medications:    . sodium chloride 20 mL/hr (03/15/12 1331)      . amLODipine  10 mg Oral Daily  . amoxicillin-clavulanate  1 tablet Oral BID AC  . feeding supplement  237 mL Oral TID BM  . feeding supplement  1 Container Oral QPC lunch  . folic acid  1 mg Oral Daily  . magnesium sulfate 1 - 4 g bolus IVPB  4 g Intravenous Daily  . metoprolol tartrate  25 mg Oral BID  . multivitamin with minerals  1 tablet Oral Daily  . potassium chloride  40  mEq Oral BID  . sodium chloride  3 mL Intravenous Q12H  . thiamine  100 mg Oral Daily  . tuberculin  5 Units Intradermal Once  . venlafaxine XR  150 mg Oral Daily  . DISCONTD: thiamine  100 mg Intravenous Daily    I  have reviewed scheduled and prn medications.  Physical Exam:  Blood pressure 148/90, pulse 94, temperature 98.4 F (36.9 C), temperature source Oral, resp. rate 18, height 5\' 7"  (1.702 m), weight 95 lb 4.8 oz (43.228 kg), SpO2 98.00%.   Vitals: T: 98.4     HR: 94       BP: 148/90      RR:  18      O2 saturation:  98%  General: resting in bed, not in acute distress HEENT: PERRL, EOMI, no scleral icterus Cardiac: S1/S2, RRR, No murmurs, gallops or rubs Pulm: Good air movement bilaterally, scattered crackles on the left side.  Abd: Soft,  nondistended, nontender, no rebound pain, no organomegaly, BS present Ext: No rashes or edema, 2+DP/PT pulse bilaterally Musculoskeletal: No joint deformities, erythema, or stiffness, ROM full and no nontender Skin: no rashes. No skin bruise. Neuro: alert and oriented X3, cranial nerves II-XII grossly intact, muscle strength 5/5 in all extremeties,   sensation to light touch intact.  Psych.: patient is not psychotic, no suicidal or hemocidal ideation.   Assessment/Plan  The patient is a 51 yo woman, history of HTN, alcoholism, presenting 7/18 with cavitary pneumonia, consulted for urinary retention and AKI.   # AKI - the patient's creatinine increased from her baseline of 0.7 to a maximum of 1.62, and has started to downtrend.  The etiology is unclear, but this most likely represents a TMP-induced rise in creatinine (Bactrim 7/29-7/30), which has likely not resolved due to subsequent development of urinary retention (see discussion below), and is now beginning to improve with discontinuation of bactrim and placement of foley. Differential also includes components of ACE-I use (lisinopril 7/25-7/29) vs AIN (Zosyn 7/18-7/31, HCTZ 7/21-7/30, less commonly protonix 7/18-8/1. However, no esosinophilia on CBC 7/29, no rash) vs diuretic use (HCTZ 7/21-7/30, unlikely given urinary retention). Unlikely contrast-induced ATN (03/05/12), as timing does not correlate. BP acutely dropped from 145/89 to 100/77 on 7/30, but this would likely not be low enough to cause ATN. Given that she has 2 recent UA's with no proteinuria or hematuria, glomerulonephritis is extremely unlikely.   Patient had good urine output yesterday (3275 ml).  Cre trended down from 1.62 (8/1) to 1.38 (8/2). No lab back yet today   -Pending renal function panel -primary service has removed foley  # Urinary Retention - The patient has a 1-day "documented" history of urinary retention.  May have been going on for several days as pt with symptoms of difficulty starting stream and mild sp tenderness only when bladder volume was as high as 1000 ml - so really don't know how long has been an issue) Differential includes medication-induced (significant ativan usage, though held 7/28-7/30. No anticholinergic usage) vs urethral diverticula vs neurogenic etiology (less likely). Unlikely UTI  (negative UA) vs urethral stricture (acute onset).   -Foley is out in AM for doing a trial of voiding, so far no urination yet. -if urinary retention happen again, consider urology consult for further evaluation (ie cystoscopy) -renal panel ordered - if creatinine is down from yesterday, we will sign off   # LUL Pneumonia - responding to Augmentin  -management per primary team   # Mental Status - waxing  and waning confusion, unclear etiology Per primary wervice  # HTN - well-controlled   # Anemia - Stable, Ferritin 2724, iron 10, TIBC: 78, Saturation Ratio: 13.   # Alcohol Abuse - MVI, folate, thiamine  Lorretta Harp, MD PGY2, Internal Medicine Teaching Service Pager: [phone]  No labs done today but foley was removed.  Pt has not yet voided (after making over 3 liters of urine yesterday) Have ordered renal panel and if there was further improvement in renal function will sign off. If patient unable to void or with significant post void residual with voiding trial urologic consultation would be appropriate. Mailey Landstrom B Addendum: Creatinine down to 1.1 with foley drainage. Recommendations per above note. Will sign off. Crystian Frith B

## 2012-03-16 NOTE — Progress Notes (Signed)
Pt will need to be sitter-free for 24 hours prior to SNF admission. MD and RN aware.  Anticipate d/c Sunday 8/4, pending void trials.  CSW will continue to follow.  Dellie Burns, MSW, [place] [phone] (weekend)

## 2012-03-17 LAB — RENAL FUNCTION PANEL
CO2: 25 mEq/L (ref 19–32)
Chloride: 107 mEq/L (ref 96–112)
Creatinine, Ser: 0.99 mg/dL (ref 0.50–1.10)
GFR calc Af Amer: 76 mL/min — ABNORMAL LOW (ref >90)
GFR calc non Af Amer: 66 mL/min — ABNORMAL LOW (ref >90)
Potassium: 4.4 mEq/L (ref 3.5–5.1)
Sodium: 140 mEq/L (ref 135–145)

## 2012-03-17 LAB — CBC
MCV: 100 fL (ref 78.0–100.0)
Platelets: 286 10*3/uL (ref 150–400)
RDW: 15.5 % (ref 11.5–15.5)
WBC: 7.8 10*3/uL (ref 4.0–10.5)

## 2012-03-17 LAB — MAGNESIUM: Magnesium: 2.1 mg/dL (ref 1.5–2.5)

## 2012-03-17 MED ORDER — METOPROLOL TARTRATE 25 MG PO TABS: 25.0000 mg | ORAL_TABLET | Freq: Two times a day (BID) | ORAL | Status: DC

## 2012-03-17 MED ORDER — AMOXICILLIN-POT CLAVULANATE 500-125 MG PO TABS: 1.0000 | ORAL_TABLET | Freq: Two times a day (BID) | ORAL | Status: AC

## 2012-03-17 MED ORDER — AMLODIPINE BESYLATE 10 MG PO TABS: 10.0000 mg | ORAL_TABLET | Freq: Every day | ORAL | Status: DC

## 2012-03-17 NOTE — Plan of Care (Signed)
Problem: Phase III Progression Outcomes Goal: Foley discontinued Outcome: Progressing Spoken with MD  RE;  residual  > 124 M s/p void  For foley cath reinsertion

## 2012-03-17 NOTE — Progress Notes (Signed)
Pt to transfer to [place] today via [facility].  CSW notified pt's sister, [name] [phone], of d/c.  D/C packet complete with chart copy and signed FL2. CSW signing off as no other CSW needs identified at this time.  Dellie Burns, MSW, LCSWA [phone] (Weekends 8:00am-4:30pm)

## 2012-03-17 NOTE — Progress Notes (Signed)
FMTS Attending Daily Note:  Renold Don MD  [phone] pager  Family Practice pager:  [phone] I have seen and examined this patient and have reviewed their chart. I have discussed this patient with the resident. I agree with the resident's findings, assessment and care plan.  Patient much improved.  Some residual noted PVR.  Plan for double PVR today and DC home with Foley if > 100 cc and urology follow-up outpatient.  If urine output < 100 cc, can dc today without Foley.  Creatinine and Hgb both stabilized.

## 2012-03-17 NOTE — Progress Notes (Signed)
1630 foley cath inserted aseptically F14 tolerated well . With good outflow . legstrap applied.  Dressing to r upper forearm dry and intact

## 2012-03-17 NOTE — Progress Notes (Signed)
Patient ID: GWENDOLYNN MERKEY, female   DOB: [birth year], 51 y.o.   MRN: [id number]  Family Medicine Teaching Service Daily Progress Note Intern Pager: [phone]  Patient name: MACAIAH MANGAL Medical record number: [id number] Date of birth: [birth year] Age: 51 y.o. Gender: female  Primary Care Provider: MATTHEWS,CODY, DO  Subjective: Pt feels well this morning, denies nausea, vomiting, AH, VH, SOB but does have mild cough. Endorses some mild heaviness in her chest on the left when she lays down but she does not appear concerned about this. Reports having diarrhea. Is taking good PO per pt. No longer has foley. Pt thinks urination is going well and that she can empty her bladder well without the catheter.  Objective: Temp:  [98.2 F (36.8 C)-98.7 F (37.1 C)] 98.2 F (36.8 C) (08/04 0530) Pulse Rate:  [88-94] 88  (08/04 0530) Resp:  [18] 18  (08/04 0530) BP: (139-148)/(76-90) 140/76 mmHg (08/04 0530) SpO2:  [98 %-100 %] 98 % (08/04 0530) Weight:  [95 lb 14.4 oz (43.5 kg)] 95 lb 14.4 oz (43.5 kg) (08/04 0530) on RA Has put out 1L of urine since foley d/c'd yesterday, ~53cc/hr  Exam: General: NAD, interactive; lying in bed; alert and oriented x 3, speech clear Cardiovascular: RRR, no murmurs, rubs, or gallops Respiratory: good inspiratory effort, no increased work of breathing, no crackles or ronchi auscultated. Abdomen: soft, nontender, non-distended, +BS Extremities: no tremor present, lower extremities show no edema, erythema, or tenderness to palpation. SCD's not in place. Neuro: answering questions appropriately, A&Ox3, but does have some decreased cognition (ie. unable to correctly say what day foley was taken out, did not know she was going to a SNF) PICC line in place  Laboratory:   Lab 03/17/12 0500 03/16/12 0505 03/15/12 1307  WBC 7.8 7.8 8.0  HGB 7.4* 7.6* 7.8*  HCT 22.0* 22.4* 24.0*  PLT 286 268 273    Lab 03/17/12 0500 03/16/12 1200 03/15/12 0530 03/11/12 0630  NA 140  139 139 --  K 4.4 4.5 3.3* --  CL 107 106 106 --  CO2 25 24 23  --  BUN 10 10 8  --  CREATININE 0.99 1.10 1.38* --  CALCIUM 9.3 9.0 8.3* --  PROT -- -- -- 7.2  BILITOT -- -- -- 0.9  ALKPHOS -- -- -- 79  ALT -- -- -- 8  AST -- -- -- 18  GLUCOSE 99 96 108* --    Lab 03/13/12 0500  INR 1.15   HIV: nonreactive, 7/18 Fecal occult blood:  Negative on 7/20  POSITIVE on 7/23 Renal panel: Mg: 2.1, Phos 4.1 Alb 1.7 (stable from yesterday)  Imaging/Diagnostic Tests - None new today  CXR, 7/18 @1424  IMPRESSION:  Dense pneumonia in the left lung, probably the left upper lobe.  CT, 7/18 @2028  IMPRESSION:  Dense left upper lobe and superior segment left lower lobe  consolidation, most compatible with pneumonia. Collapse with  proximal endobronchial lesion is felt less likely.  Gallstones with possible choledocholithiasis or volume averaging  from pancreatic calcification. Consider MRCP for further  evaluation.  Hepatic steatosis.  Trace upper abdominal ascites.  MRCP, 7/19 @1243  IMPRESSION:  1. Moderate to severely degraded exam, secondary to motion and  inability to hold breath.  2. No biliary ductal dilatation. Distal common duct findings,  which when combined with CT findings, are most consistent with  small distal common duct stones. Concurrent cholelithiasis, as on CT.  3. Findings of chronic pancreatitis, without definite acute  superimposed process.  4. Marked  hepatic steatosis.  5. Left base air space disease and adjacent fluid, as before.  6. Renal and perirenal edema with suggestion of decreased contrast  excretion on delayed images. Question acute or subacute renal  Insufficiency.  CXR, 7/21 IMPRESSION:  1. Significantly worsening aeration throughout the left lung,  compatible with progressive multilobar pneumonia.  2. New small bilateral pleural effusions.  3. Atherosclerosis.  CT Chest, 7/23 @1520  IMPRESSION:  1. Progressive multifocal pneumonia on the  left with  consolidation, cavitation and lobar expansion. These features are  most suggestive of infection with a gram-negative organism  (Klebsiella, E Coli or Pseudomonas).  2. Associated small right greater than left pleural effusions and  probable reactive mediastinal lymphadenopathy.  CT Head, 7/26 @1331  IMPRESSION:  1. No acute intracranial abnormality. No acute traumatic injury  identified.  2. Age advanced global atrophy.  CXR, 7/28 @1402  IMPRESSION:  Right lung is clear. Persistent consolidation with air bronchogram  and cavitation in the left upper lobe consistent with pneumonia.  Slight improvement in aeration left lower lobe.  Renal US, 7/31 @1116  IMPRESSION:  Slight increased echotexture within the kidneys bilaterally. This  can be seen with chronic medical renal disease. No hydronephrosis.  Assessment/Plan: Magaby Rumberger is a 51yo female with HTN, EtOH abuse/alcoholic hepatitis, and breast CA (2001, s/p tx) presenting 7/18 from clinic with inability to tolerate PO with shortness of breath. Found to have significant LUL cavitary pneumonia; also with multiple interrelated issues (HTN/tachycardia, electrolyte abnormalities, AKI, anemia). Currently with urinary retention of unknown origin.  1. Shortness of breath/Cough: CXR/CT findings PNA of LUL with cavitation.  DDx aspiration pna with h/o alcohol abuse, TB.  Subjective fevers at home, afebrile since admission. PPD neg 7/22. Vanc added 7/21 as pt not improving on Zosyn + azithromycin. Pulm (Dr. Molli Knock) consulted 7/25 for evidence of cavitary PNA with little clinical improvement. Respiratory culture shows only few yeast, compatible with Candida, ?contaminant. Vancdiscontinued 7/29 due to kidney function, switched to Bactrim for MRSA coverage. Abx discontinued 7/30 and ID consulted, recs Augmentin 500-125 BID for 3 weeks of total tx (8/8 last day). Lung exams continue improving. WBC stable at 7.8. - Continue Augmentin BID (last  day of dosing 8/8) for 3 weeks of total therapy. (Pt received abx for 12 total days, MRSA coverage for 10, Zosyn for 12, Azithro for 3, Vanc for 9, Bactrim for 1). - Repeat CXR 2-3 weeks as outpatient - Mild heaviness of chest with lying down reported during morning review of systems was discussed and examined by upper level resident Dr. Aviva Signs; deemed not concerning for cardiac pain. - No increased O2 reqmt or fevers to date - continue to monitor - Incentive spirometry.   2. Mental status/tremor - Waxing and waning confusion/reduced responsiveness, coarse tremor on exam especially with movement, and global atrophy on CT 7/26 s/p fall. Also with new complaint of diarrhea, per nursing. Given history of heavy alcohol use and little apparent clinical improvement of pt's pneumonia in addition to the above symptoms, neurology was consulted, 7/28 (Dr. Lu Duffel). CXR improved on 7/28 slightly from previous days. EKG without changed, lactic acid slightly increased, ammonia normal. UA negative but pt has been on abx for multiple days.  Blood cultures show no growth to date and Legionella negative. 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 Ativan scheduled has been discontinued as of 7/30. Mental status recently still with some slight waxing and waning but overall much improved past 3 days. - Avoid Haldol (last QTc ~  460), but may order Ativan 0.5 mg PRN q8 for agitation per neuro recs; would avoid meds completely if possible. Pt previously had sitter in room, but sitter was dc'd yesterday.  3. Acute kidney injury - Pt very thin with little muscle mass, with initial Cr of 1.13, trended down to 0.47 with IVF and improved hydration. Slow increase since that time, to 1.45 on 7/30. Etiology unclear, probably multifactorial; pt has been on vancomycin (7/21-7/29) and Bactrim (7/29-7/30), lisinopril restarted 7/25 and dosage increased 7/28; last IV contrast admin 7/23. Pt also complaining  of intermittent diarrhea and on diuretic (HCTZ) with generally poor PO intake. Nephrotoxic drugs d/c'd and held on 7/30. Nephrology 7/31 (Dr. Eliott Nine), recommended random vanc level to see if med is still present.  Renal US shows mild increased echogenicity; no apparent evidence of glomerulonephropathy. Cr mildly elevated 7/30 --> has trended down and is currently 0.99. - Continue to avoid nephrotoxins and monitor Cr and strict I:O for oliguria (none presently) - Nephrology c/s ([facility]) recs - patient had foley catheter, was dc'd yesterday with good urine output since then. - Restarted oral K 40 meq BID on 8/2 -> K today is 4.4 - Renal panel today shows Mag 2.1, phos 4.1, Ca 9.3, Cr 0.99 - stopped protonix per renal recs that it could improve Cr - Will likely recommend checking Cr 1 week after discharge. - If cr worsens, increase IVF, currently NS @20cc /hr  4. Urinary retention - New on 7/31, pt with 1000 mL on in-and-out cath and 830 mL average post-void residual for the prior three voids. Uncertain etiology; pt does not appear to be on any anticholinergic medications. UA on 7/28 and on 7/31 unremarkable, pt has been on extensive abx course for PNA, so UTI seems less unlikely. No evidence of urinary obstruction on renal US.  Post-void residual with 640cc 7/31, inserted foley, then failed voiding trial and reinserted foley. - Appreciate further renal c/s recommendations for etiology and management. - Strict I&O - Yesterday foley was removed for voiding trial, since then UOP 53 cc/hr (1L total).  @1500 : voided 50, PVR 190 @1810 : voided 150, PVR 230, 25 min later void 150 - Consulted urology today. Recommend double voiding residual (patient voids, wait 10 minutes, voids again, then check residual). If retaining, recommend placing foley and having pt f/u as outpatient in 2-3 weeks. Will do double voiding residual today prior to d/c to determine need for foley.  5. HTN: Slightly low  BP on admission, anti-hypertensives were held, then intermittent elevated pressures. Home HCTZ restarted on 7/21, restarted lisinopril 7/25; lisinopril increased to 40 mg daily on 7/27. Lisinopril stopped 7/29 due to elevated creatinine, switched to Norvasc 10. BP remains elevated but improved, highest in last 24 hr 148/90 now, with tachycardia now only intermittently (highest pulse in last 24 hr 94) - Continue Norvasc 10 BID (started 7/29). - Continue metoprolol 25 mg BID (started 7/30).  - Hydralazine PRN for BP >180/100.  6. Anemia - Most likely mixed vitamin/iron deficiencies secondary to EtOH abuse and general poor PO intake, repeated blood draws, acute illness, hemodilation; MCV near upper limit of normal with one measurement 101.6, and iron panel on 7/21 shows low iron and TIBC but elevation of ferritin; pt likely with iron metabolism derangement secondary to alcoholism. Hb 11.0 in April 2013, 8.5 on admission this time. Trended down to 6.9 on 7/19.  7.6 on 7/20. Oral iron started 7/21, but discontinued 7/23; we do not want to iron overload her.  Stool initially were hemoccult negative, no bloody emesis. Stool hemoccult positive on 7/23. Hb 5.6, pt transfused 2 units on 7/24 with Hb rise to 9.5. Hb with slight slow drift down again, 7/26 9.0 --> 7/27 8.7 --> 7/28 8.5. Stable 7/29 at 8.6, 7/30 at 8.3, 7/31 at 8.6, 7.7 on 8/1, dropped to 7 on 8/2; 7.6 on 8/3; 7.4 on 8/4.  - Typed and crossed on 8/2 but transfusion was not needed - Drift may be at least partially due to extensive lab draws though continuing to drop and patient reporting melanotic stool.   - [facility] re-consulted -rec no endoscopy unless significant overt bleeding; appreciate further recommendations - Continue multivitamin, folate, thiamine.   7. Tachycardia: Initially believed likely secondary to dehydration given hx of inability to tolerate PO, no evidence of PE since admission. Also considered possible EtOH withdrawal and/or  component of anemia, as above. Given elevated BP, DDx could also include more exotic causes such as carcinoid, pheochromocytoma, etc. Intermittent pulse rate in normal range around 80-90's, now more steady in normal range after adding metoprolol 7/30. Monitor with other signs/symptoms.  88 today.  8. Abdominal pain/anorexia: Patient with history of EtOH abuse and EtOH induced hepatitis, initially presented with abdominal pain, icterus and inability to tolerate PO. Broad differential including pancreatitis, hepatitis, malignancy, gallstones, gastritis, antibiotic side effect. Admitted to telemetry and for hydration. CT shows LUL PNA, gallstones with possible choledocolithiasis and hepatic steatosis with trace upper abd ascites. MRCP on 7/19 is a generally poor study, shows current cholelithiasis and chronic pancreatitis and nonspecific distal common duct findings. Abdominal pain appears resolved; some complaints diarrhea intermittently.  C.diff negative 7/28. Stool cultures show yeast likely due to reduced normal flora.  - PO yogurt to improve GI flora - Immodium d/c'd 8/2  - Will monitor clinically; hopefully will improve as abx come off.  - Protonix daily d/c'd 8/2 per renal recommendations  - Cont. Zofran PRN for nausea.  - GI consulted-  Consider adding pepcid or readding protonix empirically; Appreciate further recs  9. EtOH abuse: Last drink reported >3 days prior to admission, deemed unlikely to withdraw if she has truly not had anything to drink more recently. CIWA protocol initiated due to clinical appearance, requiring intermittent dosing 7/19-7/27. Pt was started on scheduled Ativan on 7/22. CIWA last renewed 7/24; pt received 4 doses on 7/26-7/27 overnight, probably inappropriately as pt is well outside the window for EtOH withdrawal and has had tremor, tachycardia, and elevated BP since admission; CIWA discontinued 7/27. - Continue to avoid sedating meds, but will address agitation as  appropriate with PRNs.  - Multivitamin, folic acid, thiamine.  10. Depression: Has expressed passive SI in the past as well as in clinic on 7/18. No plan vocalized. Will continue home SSRI and monitor closely. Possible contribution to elevated BP by Effexor; may consider changing.  No SI/ HI during hosp course.  11. PT/OT - Updated notes reflect recommendation for SNF placement.  CSW actively involved and pt agreeable. - Assistance and recommendations from all sides is appreciated.  FEN/GI: Dys 3 diet, add yogurt; NS @ 20 mL/hr. MVI with thiamine and folic acid; Multiple K and Mg replenishments throughout stay; will continue as appropriate.  - K: 3.3 8/2-  Re-added KCl PO 40 Meq BID; K is 4.4 today PPX: Lovenox d/ced 7/23 as FOBT is positive, SCD's only for now, but not in place this AM. Protonix d/c'ed given possible effect on Cr. Dispo: D/C today to SNF; Will need outpatient  CXR in 2-3 weeks to re-evaluate pneumonia; will need outpatient GI f/u for anemia/melena; outpatient urology f/u to evaluate retention  Latrelle Dodrill, MD PGY-1,  [facility] FPTS Intern pager: [phone]

## 2012-03-17 NOTE — Progress Notes (Signed)
Transfer paperworks and prescriptions competed

## 2012-03-17 NOTE — Progress Notes (Signed)
1500 placed a call  To MD re: s/p residual .>124 with foley cath order and d/c with foley .

## 2012-03-18 NOTE — Discharge Summary (Signed)
Family Medicine Teaching Service  Discharge Note : Attending Jeff Emeli Goguen MD Pager [phone] Inpatient Team Pager:  [phone]  I have seen and examined this patient, reviewed their chart and discussed discharge planning with the resident at the time of discharge. I agree with the discharge plan as above.  

## 2012-04-24 ENCOUNTER — Telehealth: Payer: Self-pay | Admitting: Family Medicine

## 2012-04-24 NOTE — Telephone Encounter (Signed)
Patient is calling because the last time she was seen she was admitted to the hospital, but she was told she would need to get an Xray of her chest.  She said she would like to have that scheduled within the next 2 weeks.

## 2012-04-24 NOTE — Telephone Encounter (Signed)
Called patient and she is requesting a chest x-ray that she claims she is supposed to have done from a hospital visit she had back in July. Patient had an x-ray and a CT of the chest in July done. I informed patient of this and reminded her of her upcoming appointment with Dr Ashley Royalty and to keep that appointment to discuss results and the need for any further testing.Youssef Footman, Rodena Medin

## 2012-04-30 ENCOUNTER — Encounter: Payer: Self-pay | Admitting: Family Medicine

## 2012-04-30 ENCOUNTER — Ambulatory Visit (INDEPENDENT_AMBULATORY_CARE_PROVIDER_SITE_OTHER): Payer: Self-pay | Admitting: Family Medicine

## 2012-04-30 VITALS — BP 162/100 | HR 76 | Temp 98.4°F | Ht 67.0 in | Wt 113.0 lb

## 2012-04-30 DIAGNOSIS — E876 Hypokalemia: Secondary | ICD-10-CM

## 2012-04-30 DIAGNOSIS — J852 Abscess of lung without pneumonia: Secondary | ICD-10-CM

## 2012-04-30 DIAGNOSIS — J850 Gangrene and necrosis of lung: Secondary | ICD-10-CM

## 2012-04-30 DIAGNOSIS — I10 Essential (primary) hypertension: Secondary | ICD-10-CM

## 2012-04-30 DIAGNOSIS — D649 Anemia, unspecified: Secondary | ICD-10-CM

## 2012-04-30 LAB — CBC
Hemoglobin: 10.9 g/dL — ABNORMAL LOW (ref 12.0–15.0)
RBC: 3.41 MIL/uL — ABNORMAL LOW (ref 3.87–5.11)
WBC: 6.1 10*3/uL (ref 4.0–10.5)

## 2012-04-30 LAB — BASIC METABOLIC PANEL
CO2: 30 mEq/L (ref 19–32)
Chloride: 100 mEq/L (ref 96–112)
Glucose, Bld: 81 mg/dL (ref 70–99)
Potassium: 3.6 mEq/L (ref 3.5–5.3)
Sodium: 138 mEq/L (ref 135–145)

## 2012-04-30 NOTE — Patient Instructions (Addendum)
Thank you for coming in today, I am glad to see you are doing much better I would like for you to get a repeat chest x ray, you can have this done at the hospital I am checking lab work today as well

## 2012-05-05 NOTE — Assessment & Plan Note (Addendum)
ACE-I/HCTZ held in hospital due to ARF.  Currently on metoprolol and amlodipine but has not taken today.  Will recheck BMET today and if renal function improved will start back on low dose ace/thiazide combo as well.

## 2012-05-05 NOTE — Assessment & Plan Note (Signed)
Improved, asymptomatic at this point.  She is abstaining from EtOH at this point.  She has completed abx course.  Will send her for CXR to be sure PNA is resolved.

## 2012-05-05 NOTE — Assessment & Plan Note (Signed)
Denies further blood in stool, recheck CBC.

## 2012-05-05 NOTE — Progress Notes (Signed)
  Subjective:    Patient ID: [name], female    DOB: [birth year], 51 y.o.   MRN: [id number]  HPI 1. HFU:  Here for HFU after hospitalization for necrotizing pneumonia.  Has completed course of antibioitics since d/c from hospital.  Feels much better.  Her appetite has returned to normal.  She still has mild cough.  Denies sputum production.   2. HTN:  BP quite elvated today.  She has not taken bp medication today.  Currently on amlodipine and metoprolol.  She is tolerating this medication well when she takes it.      Review of Systems  She has had not fever, chills, anorexia, shortness of breath, chest pain, palpitations, headache.      Objective:   Physical Exam  Constitutional: She appears well-nourished. No distress.  HENT:  Head: Normocephalic and atraumatic.  Eyes: Conjunctivae normal are normal. Pupils are equal, round, and reactive to light.  Neck: Neck supple.  Cardiovascular: Normal rate and regular rhythm.   Pulmonary/Chest: Effort normal and breath sounds normal. No respiratory distress. She has no wheezes.  Musculoskeletal: She exhibits no edema.          Assessment & Plan:

## 2012-05-15 ENCOUNTER — Telehealth: Payer: Self-pay | Admitting: Family Medicine

## 2012-05-15 NOTE — Telephone Encounter (Signed)
Fwd. To Dr.Matthews. .Aidon Klemens  

## 2012-05-15 NOTE — Telephone Encounter (Signed)
Pt is asking for results of her labs and also when she is supposed to come back for next appt.

## 2012-05-15 NOTE — Telephone Encounter (Signed)
Lab results normal except for mild anemia.  I think this will continue to improve.  She still needs to get chest x ray i ordered for her.  She can follow up with me in one month.

## 2012-05-15 NOTE — Telephone Encounter (Signed)
Called pt. LMVM. See Dr.Matthew's message. Lorenda Hatchet, Renato Battles

## 2012-05-20 NOTE — Telephone Encounter (Signed)
Left message again for patient to return call.Busick, Robert Lee  

## 2012-05-20 NOTE — Telephone Encounter (Signed)
Patient returned call and was given message.Busick, Robert Lee  

## 2012-05-31 ENCOUNTER — Ambulatory Visit (HOSPITAL_COMMUNITY)
Admission: RE | Admit: 2012-05-31 | Discharge: 2012-05-31 | Disposition: A | Discharge status: Home / Self Care | Payer: Self-pay | Source: Ambulatory Visit | Attending: Family Medicine | Admitting: Family Medicine

## 2012-05-31 DIAGNOSIS — J850 Gangrene and necrosis of lung: Secondary | ICD-10-CM

## 2012-05-31 DIAGNOSIS — R05 Cough: Secondary | ICD-10-CM | POA: Insufficient documentation

## 2012-05-31 DIAGNOSIS — R059 Cough, unspecified: Secondary | ICD-10-CM | POA: Insufficient documentation

## 2012-06-10 ENCOUNTER — Encounter (HOSPITAL_COMMUNITY): Payer: Self-pay | Admitting: Emergency Medicine

## 2012-06-10 ENCOUNTER — Emergency Department (HOSPITAL_COMMUNITY)
Admission: EM | Admit: 2012-06-10 | Discharge: 2012-06-10 | Disposition: A | Discharge status: Home / Self Care | Payer: MEDICAID | Attending: Emergency Medicine | Admitting: Emergency Medicine

## 2012-06-10 DIAGNOSIS — Z853 Personal history of malignant neoplasm of breast: Secondary | ICD-10-CM | POA: Insufficient documentation

## 2012-06-10 DIAGNOSIS — I10 Essential (primary) hypertension: Secondary | ICD-10-CM | POA: Insufficient documentation

## 2012-06-10 DIAGNOSIS — H109 Unspecified conjunctivitis: Secondary | ICD-10-CM

## 2012-06-10 DIAGNOSIS — Z87891 Personal history of nicotine dependence: Secondary | ICD-10-CM | POA: Insufficient documentation

## 2012-06-10 DIAGNOSIS — K7010 Alcoholic hepatitis without ascites: Secondary | ICD-10-CM | POA: Insufficient documentation

## 2012-06-10 MED ORDER — LIDOCAINE HCL (PF) 1 % IJ SOLN
INTRAMUSCULAR | Status: AC
  Administered 2012-06-10: 2.1 mL
  Filled 2012-06-10: qty 5

## 2012-06-10 MED ORDER — CEFTRIAXONE SODIUM 1 G IJ SOLR
1.0000 g | Freq: Once | INTRAMUSCULAR | Status: AC
  Administered 2012-06-10: 1 g via INTRAMUSCULAR
  Filled 2012-06-10: qty 10

## 2012-06-10 MED ORDER — SULFACETAMIDE SODIUM 10 % OP SOLN
2.0000 [drp] | Freq: Once | OPHTHALMIC | Status: AC
  Administered 2012-06-10: 2 [drp] via OPHTHALMIC
  Filled 2012-06-10: qty 15

## 2012-06-10 NOTE — ED Notes (Signed)
Pt stated that she woke up this morning and her eyes swollen, red, and had some drainage. Pt c/o burning and itching. Stated that she was discharged from the hospital in August after being admitted with PNA.

## 2012-06-10 NOTE — ED Provider Notes (Signed)
History     CSN: [id number]  Arrival date & time 06/10/12  4098   First MD Initiated Contact with Patient 06/10/12 1005      Chief Complaint  Patient presents with  . Eye Drainage  . Conjunctivitis     HPI Pt stated that she woke up this morning and her eyes swollen, red, and had some drainage. Pt c/o burning and itching. Stated that she was discharged from the hospital in August after being admitted with PNA.   Past Medical History  Diagnosis Date  . Hypertension   . Alcoholic hepatitis   . Alcoholism   . Breast cancer 2001    Rt side. Had lumpectomy, chemo and XRT by [facility]  . Shortness of breath     Past Surgical History  Procedure Date  . Rt lumpectomy 2001    No family history on file.  History  Substance Use Topics  . Smoking status: Former Smoker    Types: Cigarettes    Quit date: 11/02/2000  . Smokeless tobacco: Never Used  . Alcohol Use: 25.2 oz/week    42 Shots of liquor per week    OB History    Grav Para Term Preterm Abortions TAB SAB Ect Mult Living                  Review of Systems  All other systems reviewed and are negative.    Allergies  Review of patient's allergies indicates no known allergies.  Home Medications   Current Outpatient Rx  Name Route Sig Dispense Refill  . FOLIC ACID 1 MG PO TABS Oral Take 1 tablet (1 mg total) by mouth daily.    Marland Kitchen METOPROLOL TARTRATE 25 MG PO TABS Oral Take 1 tablet (25 mg total) by mouth 2 (two) times daily. 60 tablet 0  . THIAMINE HCL 100 MG PO TABS Oral Take 1 tablet (100 mg total) by mouth daily.    . VENLAFAXINE HCL ER 150 MG PO CP24 Oral Take 150 mg by mouth daily.      BP 187/112  Pulse 90  Temp 99.3 F (37.4 C) (Oral)  Resp 20  SpO2 100%  Physical Exam  Nursing note and vitals reviewed. Constitutional: She is oriented to person, place, and time. She appears well-developed and well-nourished. No distress.  HENT:  Head: Normocephalic and atraumatic.  Eyes: EOM are normal. Pupils  are equal, round, and reactive to light. Right eye exhibits discharge (Significant). Left eye exhibits discharge (Significant). Right conjunctiva is injected. Left conjunctiva is injected.  Neck: Normal range of motion.  Cardiovascular: Normal rate and intact distal pulses.   Pulmonary/Chest: No respiratory distress.  Abdominal: Normal appearance. She exhibits no distension.  Musculoskeletal: Normal range of motion.  Neurological: She is alert and oriented to person, place, and time. No cranial nerve deficit.  Skin: Skin is warm and dry. No rash noted.  Psychiatric: She has a normal mood and affect. Her behavior is normal.    ED Course  Procedures (including critical care time)  Medications  venlafaxine XR (EFFEXOR-XR) 150 MG 24 hr capsule (not administered)  sulfacetamide (BLEPH-10) 10 % ophthalmic solution 2 drop (not administered)  cefTRIAXone (ROCEPHIN) injection 1 g (1 g Intramuscular Given 06/10/12 1032)  lidocaine (XYLOCAINE) 1 % injection (2.1 mL  Given 06/10/12 1032)     Labs Reviewed  EYE CULTURE   No results found.   1. Bacterial conjunctivitis       MDM  We discharged with sulfacetamide eyedrops.  Will return tomorrow for recheck.       Nelia Shi, MD 06/10/12 [phone]

## 2012-06-10 NOTE — ED Notes (Signed)
Pt left before signing discharge  

## 2012-06-11 ENCOUNTER — Encounter (HOSPITAL_COMMUNITY): Payer: Self-pay

## 2012-06-11 ENCOUNTER — Emergency Department (HOSPITAL_COMMUNITY)
Admission: EM | Admit: 2012-06-11 | Discharge: 2012-06-11 | Disposition: A | Discharge status: Home / Self Care | Payer: Self-pay | Attending: Emergency Medicine | Admitting: Emergency Medicine

## 2012-06-11 DIAGNOSIS — Z853 Personal history of malignant neoplasm of breast: Secondary | ICD-10-CM | POA: Insufficient documentation

## 2012-06-11 DIAGNOSIS — Z87891 Personal history of nicotine dependence: Secondary | ICD-10-CM | POA: Insufficient documentation

## 2012-06-11 DIAGNOSIS — F1021 Alcohol dependence, in remission: Secondary | ICD-10-CM | POA: Insufficient documentation

## 2012-06-11 DIAGNOSIS — Z79899 Other long term (current) drug therapy: Secondary | ICD-10-CM | POA: Insufficient documentation

## 2012-06-11 DIAGNOSIS — Z862 Personal history of diseases of the blood and blood-forming organs and certain disorders involving the immune mechanism: Secondary | ICD-10-CM | POA: Insufficient documentation

## 2012-06-11 DIAGNOSIS — H119 Unspecified disorder of conjunctiva: Secondary | ICD-10-CM | POA: Insufficient documentation

## 2012-06-11 DIAGNOSIS — I10 Essential (primary) hypertension: Secondary | ICD-10-CM | POA: Insufficient documentation

## 2012-06-11 DIAGNOSIS — Z8639 Personal history of other endocrine, nutritional and metabolic disease: Secondary | ICD-10-CM | POA: Insufficient documentation

## 2012-06-11 NOTE — ED Notes (Signed)
Pt to ED for recheck after pink eye dx yesterday

## 2012-06-11 NOTE — ED Provider Notes (Signed)
History     CSN: [id number]  Arrival date & time 06/11/12  4098   First MD Initiated Contact with Patient 06/11/12 [phone]      Chief Complaint  Patient presents with  . Eye Pain    (Consider location/radiation/quality/duration/timing/severity/associated sxs/prior treatment) HPI The patient presents one day after being initially evaluated for concerns of bilateral eye discharge.  She was diagnosed with conjunctivitis, treated with topical medication.  She notes that in the interval she has no new concerning changes, and she states that her discharge is diminished.  She denies any acuity changes, headache, fevers, chills. Past Medical History  Diagnosis Date  . Hypertension   . Alcoholic hepatitis   . Alcoholism   . Breast cancer 2001    Rt side. Had lumpectomy, chemo and XRT by [facility]  . Shortness of breath     Past Surgical History  Procedure Date  . Rt lumpectomy 2001    History reviewed. No pertinent family history.  History  Substance Use Topics  . Smoking status: Former Smoker    Types: Cigarettes    Quit date: 11/02/2000  . Smokeless tobacco: Never Used  . Alcohol Use: 25.2 oz/week    42 Shots of liquor per week    OB History    Grav Para Term Preterm Abortions TAB SAB Ect Mult Living                  Review of Systems  All other systems reviewed and are negative.    Allergies  Review of patient's allergies indicates no known allergies.  Home Medications   Current Outpatient Rx  Name Route Sig Dispense Refill  . FOLIC ACID 1 MG PO TABS Oral Take 1 tablet (1 mg total) by mouth daily.    Marland Kitchen METOPROLOL TARTRATE 25 MG PO TABS Oral Take 1 tablet (25 mg total) by mouth 2 (two) times daily. 60 tablet 0  . THIAMINE HCL 100 MG PO TABS Oral Take 1 tablet (100 mg total) by mouth daily.    . VENLAFAXINE HCL ER 150 MG PO CP24 Oral Take 150 mg by mouth daily.      BP 178/125  Pulse 96  Temp 99.3 F (37.4 C) (Oral)  Resp 20  SpO2 100%  Physical Exam    Nursing note and vitals reviewed. Constitutional: She is oriented to person, place, and time. She appears well-developed and well-nourished. No distress.  HENT:  Head: Normocephalic and atraumatic.  Eyes: EOM are normal. Pupils are equal, round, and reactive to light. Right eye exhibits discharge. Left eye exhibits discharge.  Neck: No tracheal deviation present.  Pulmonary/Chest: No stridor. No respiratory distress.  Neurological: She is alert and oriented to person, place, and time. No cranial nerve deficit. She exhibits normal muscle tone. Coordination normal.  Skin: Skin is warm and dry. She is not diaphoretic.  Psychiatric: She has a normal mood and affect.    ED Course  Procedures (including critical care time)  Labs Reviewed - No data to display No results found.   1. Conjunctival disease       MDM  The patient presents one day after an initial diagnosis of conjunctivae, now for repeat evaluation.  On exam she is in no distress.  Her denial of any concerning changes, and her endorsement of improvement of his reassuring.  She was discharged following a discussion on return precautions, and instructed to follow with both ophthalmology and her primary care physician.    Gerhard Munch, MD  06/11/12 0914 

## 2012-06-12 LAB — EYE CULTURE: Special Requests: NORMAL

## 2012-06-13 ENCOUNTER — Encounter: Payer: Self-pay | Admitting: Family Medicine

## 2012-06-13 ENCOUNTER — Ambulatory Visit (INDEPENDENT_AMBULATORY_CARE_PROVIDER_SITE_OTHER): Payer: Self-pay | Admitting: Family Medicine

## 2012-06-13 VITALS — BP 183/109 | HR 85 | Ht 67.0 in | Wt 111.5 lb

## 2012-06-13 DIAGNOSIS — I10 Essential (primary) hypertension: Secondary | ICD-10-CM

## 2012-06-13 DIAGNOSIS — J852 Abscess of lung without pneumonia: Secondary | ICD-10-CM

## 2012-06-13 DIAGNOSIS — R0602 Shortness of breath: Secondary | ICD-10-CM

## 2012-06-13 DIAGNOSIS — R109 Unspecified abdominal pain: Secondary | ICD-10-CM

## 2012-06-13 DIAGNOSIS — J850 Gangrene and necrosis of lung: Secondary | ICD-10-CM

## 2012-06-13 MED ORDER — THIAMINE HCL 100 MG PO TABS: 100.0000 mg | ORAL_TABLET | Freq: Every day | ORAL | Status: DC

## 2012-06-13 MED ORDER — METOPROLOL TARTRATE 25 MG PO TABS: 25.0000 mg | ORAL_TABLET | Freq: Two times a day (BID) | ORAL | Status: DC

## 2012-06-13 MED ORDER — VENLAFAXINE HCL ER 150 MG PO CP24: 150.0000 mg | ORAL_CAPSULE | Freq: Every day | ORAL | Status: DC

## 2012-06-13 MED ORDER — FOLIC ACID 1 MG PO TABS: 1.0000 mg | ORAL_TABLET | Freq: Every day | ORAL | Status: DC

## 2012-06-13 MED ORDER — LISINOPRIL 20 MG PO TABS: 20.0000 mg | ORAL_TABLET | Freq: Every day | ORAL | Status: DC

## 2012-06-13 NOTE — Patient Instructions (Addendum)
Thank you for coming in today, it was good to see you Your last chest x ray shows that your pneumonia is resolving Start the new blood pressure medication called lisinopril You [facility] be [facility] Return in two weeks to recheck your blood pressure.

## 2012-06-13 NOTE — ED Notes (Signed)
+  wound Chart sent to EDP office for review. 

## 2012-06-14 NOTE — ED Notes (Signed)
Chart returned from EDP office .Patient improved on recheck of 06/11/2012-ensure follow up with opthmalogy  or PCP if symptons persist Per Felicie Morn.

## 2012-06-15 ENCOUNTER — Telehealth (HOSPITAL_COMMUNITY): Payer: Self-pay | Admitting: Emergency Medicine

## 2012-06-16 NOTE — Progress Notes (Signed)
  Subjective:    Patient ID: [name], female    DOB: 1961/08/07, 51 y.o.   MRN: [id number]  HPI  1. HTN:  Has been out of BP medications for a few days because she states that she is unable to afford.  She states she does have the orange card but was unaware that she could get rx filled at [facility] pharmacy.  Only medication she was on was metoprolol.  Lisinopril stopped in hospital due to ARF.  She denies headache, vision changes, palpitations, chest pain.   2. Pneumonia:  Doing well after hospitalization for PNA a couple of months ago.  Repeat CXR recently showed continued significant resolution of previous infiltrate.  She denies fever, chills, weight loss, change in appetite, shortness of breath, nausea or vomiting.   Review of Systems Per HPI    Objective:   Physical Exam  Constitutional: She appears well-nourished. No distress.  HENT:  Head: Normocephalic and atraumatic.  Neck: Neck supple.  Cardiovascular: Normal rate, regular rhythm and normal heart sounds.   Pulmonary/Chest: Effort normal and breath sounds normal.  Abdominal: Soft.  Musculoskeletal: She exhibits no edema.          Assessment & Plan:

## 2012-06-16 NOTE — Assessment & Plan Note (Signed)
BP not well controlled currently.  Last Cr <1.0, will restart lisinopril.  Instructed to continue metoprolol.  Discussed with her that she should be able to obtain these at [facility] pharmacy as long as she had the orange card.

## 2012-06-16 NOTE — Assessment & Plan Note (Signed)
Recent cxr with "Significant improvement in the left upper lobe consolidation."  I do not think she needs continued antibiotics.  She has abstained from drinking since discharge from the hospital.

## 2012-06-27 ENCOUNTER — Encounter: Payer: Self-pay | Admitting: Family Medicine

## 2012-06-27 ENCOUNTER — Ambulatory Visit (INDEPENDENT_AMBULATORY_CARE_PROVIDER_SITE_OTHER): Payer: Self-pay | Admitting: Family Medicine

## 2012-06-27 VITALS — BP 160/98 | HR 65 | Temp 98.0°F | Ht 67.0 in | Wt 112.0 lb

## 2012-06-27 DIAGNOSIS — R05 Cough: Secondary | ICD-10-CM

## 2012-06-27 DIAGNOSIS — I10 Essential (primary) hypertension: Secondary | ICD-10-CM

## 2012-06-27 DIAGNOSIS — F322 Major depressive disorder, single episode, severe without psychotic features: Secondary | ICD-10-CM

## 2012-06-27 MED ORDER — FLUOXETINE HCL 20 MG PO TABS: 20.0000 mg | ORAL_TABLET | Freq: Every day | ORAL | Status: DC

## 2012-06-27 MED ORDER — LISINOPRIL 20 MG PO TABS: 20.0000 mg | ORAL_TABLET | Freq: Every day | ORAL | Status: DC

## 2012-06-27 MED ORDER — LISINOPRIL 40 MG PO TABS: 40.0000 mg | ORAL_TABLET | Freq: Every day | ORAL | Status: DC

## 2012-06-27 NOTE — Patient Instructions (Addendum)
Thank you for coming in today, it was good to see you Increase your lisinopril to 40mg  Start taking fluoxetine instead of effexor.  Return in 2 weeks for follow up

## 2012-06-30 DIAGNOSIS — R05 Cough: Secondary | ICD-10-CM | POA: Insufficient documentation

## 2012-06-30 NOTE — Assessment & Plan Note (Signed)
Effexor too expensive for her and unavailble at [facility] pharmacy.  Will change over to prozac, follow up in two weeks.

## 2012-06-30 NOTE — Assessment & Plan Note (Signed)
BP remains elevated.  WIll increase lisinopril to 40mg  .  F/u 2 weeks, check labwork.

## 2012-06-30 NOTE — Assessment & Plan Note (Signed)
Likely due to URI.  No signs of recurrent PNA.  Advised rest and push fluids.  Return if not improving

## 2012-06-30 NOTE — Progress Notes (Signed)
  Subjective:    Patient ID: [name], female    DOB: [birth year], 51 y.o.   MRN: [id number]  HPI  1. HTN:   Compliant with current BP medications.  BP still elevated.  She does not follow BP at home.  She denies chest pain, palpitations, headache, vision changes.   2. Cough:  Cough x 4 days.  Mildly productive.  Associated with nasal congestions.  She denies shortness of breath, fever, chills, change in appetites.   3. Depression:  Effexor unavailable at [facility] pharmacy.  Patient feels like medication was benefiting her but would be willing to try a different medication available at pharmacy.   Review of Systems Per HPI    Objective:   Physical Exam  Constitutional:       Thin female, nad   HENT:  Head: Normocephalic and atraumatic.  Mouth/Throat: Oropharynx is clear and moist.       Nose congested   Neck: Neck supple.  Cardiovascular: Normal rate, regular rhythm and normal heart sounds.   Pulmonary/Chest: Effort normal and breath sounds normal. No respiratory distress. She has no wheezes.  Musculoskeletal: She exhibits no edema.  Psychiatric: She has a normal mood and affect. Her behavior is normal.          Assessment & Plan:

## 2012-07-10 ENCOUNTER — Encounter: Payer: Self-pay | Admitting: Family Medicine

## 2012-07-10 ENCOUNTER — Ambulatory Visit (INDEPENDENT_AMBULATORY_CARE_PROVIDER_SITE_OTHER): Payer: Self-pay | Admitting: Family Medicine

## 2012-07-10 VITALS — BP 178/100 | HR 86 | Temp 98.6°F | Ht 67.0 in | Wt 113.0 lb

## 2012-07-10 DIAGNOSIS — I10 Essential (primary) hypertension: Secondary | ICD-10-CM

## 2012-07-10 NOTE — Patient Instructions (Addendum)
Thank you for coming in today, it was good to see you Take lisinopril 40mg  daily Take metoprolol 25 mg two times daily Follow up with me in 2-3 weeks or sooner as needed.

## 2012-07-14 NOTE — Assessment & Plan Note (Addendum)
BP elevated today, however she has not increased lisinopril.  Given written and verbal instructions and had her repeat back to me what she was supposed to do with lisinopril as well as starting metoprolol.  She will follow up again with me in 2-3 weeks.

## 2012-07-14 NOTE — Progress Notes (Signed)
  Subjective:    Patient ID: [name], female    DOB: 01/17/1961, 51 y.o.   MRN: [id number]  HPI  1. HTN:  Here for f/u on HTN.  Since last appt.has not increase lisinopril as she was unsure which she should be taking, the 20mg  or 40mg  tablets.  She also is not taking metoprolol.  Does not monitor BP at home. She denies any symptoms currently including chest pain, headache, palpitations, vision changes or weakness.   Review of Systems Per HPI    Objective:   Physical Exam  Constitutional:       Thin female, nad   Cardiovascular: Normal rate, regular rhythm and normal heart sounds.   Pulmonary/Chest: Effort normal and breath sounds normal.  Musculoskeletal: She exhibits no edema.  Neurological: She is alert.          Assessment & Plan:

## 2012-08-01 ENCOUNTER — Ambulatory Visit: Payer: Self-pay | Admitting: Family Medicine

## 2012-08-16 ENCOUNTER — Ambulatory Visit: Payer: Self-pay | Admitting: Family Medicine

## 2012-08-22 ENCOUNTER — Ambulatory Visit: Payer: Self-pay | Admitting: Family Medicine

## 2012-09-09 ENCOUNTER — Ambulatory Visit: Payer: Self-pay | Admitting: Family Medicine

## 2012-09-10 ENCOUNTER — Encounter: Payer: Self-pay | Admitting: Family Medicine

## 2012-09-10 ENCOUNTER — Ambulatory Visit (INDEPENDENT_AMBULATORY_CARE_PROVIDER_SITE_OTHER): Payer: No Typology Code available for payment source | Admitting: Family Medicine

## 2012-09-10 VITALS — BP 160/96 | HR 73 | Ht 67.0 in | Wt 115.0 lb

## 2012-09-10 DIAGNOSIS — F322 Major depressive disorder, single episode, severe without psychotic features: Secondary | ICD-10-CM

## 2012-09-10 DIAGNOSIS — I10 Essential (primary) hypertension: Secondary | ICD-10-CM

## 2012-09-10 DIAGNOSIS — F309 Manic episode, unspecified: Secondary | ICD-10-CM

## 2012-09-10 LAB — CBC
MCHC: 36.1 g/dL — ABNORMAL HIGH (ref 30.0–36.0)
MCV: 93.8 fL (ref 78.0–100.0)
Platelets: 207 10*3/uL (ref 150–400)
RDW: 16.4 % — ABNORMAL HIGH (ref 11.5–15.5)
WBC: 5 10*3/uL (ref 4.0–10.5)

## 2012-09-10 LAB — COMPREHENSIVE METABOLIC PANEL
ALT: 17 U/L (ref 0–35)
AST: 27 U/L (ref 0–37)
CO2: 27 mEq/L (ref 19–32)
Calcium: 10 mg/dL (ref 8.4–10.5)
Chloride: 102 mEq/L (ref 96–112)
Sodium: 142 mEq/L (ref 135–145)
Total Protein: 7.9 g/dL (ref 6.0–8.3)

## 2012-09-10 MED ORDER — CARBAMAZEPINE 200 MG PO TABS: 200.0000 mg | ORAL_TABLET | Freq: Two times a day (BID) | ORAL | Status: DC

## 2012-09-10 MED ORDER — METOPROLOL TARTRATE 50 MG PO TABS: 50.0000 mg | ORAL_TABLET | Freq: Two times a day (BID) | ORAL | Status: DC

## 2012-09-10 NOTE — Patient Instructions (Addendum)
It was a pleasure to meet you today.  I am concerned that your depression symptoms coupled with your lack of sleep and racing thoughts may indicate a different diagnosis than plain depression.  For this reason, I want you to STOP TAKING FLUOXETINE.  START taking CARBAMAZEPINE 200mg  tablets, one table TWICE DAILY.  Follow up with Dr Ashley Royalty in the coming 2 weeks.   Call to make an appointment with the [facility]; I recommend that you meet with our psychologist, Dr. Spero Geralds.  You can schedule an appointment with her by calling her directly at [phone].  For your BLOOD PRESSURE, I recommend you increase your METOPROLOL to 50mg  TWICE DAILY.  We will recheck your blood pressure at your next visit.

## 2012-09-11 ENCOUNTER — Ambulatory Visit: Payer: Self-pay | Admitting: Family Medicine

## 2012-09-11 NOTE — Assessment & Plan Note (Signed)
Hypertension poorly controlled on current regimen.  Plan to increase dose of her lopressor, recheck BP at next office visit in 2 weeks.

## 2012-09-11 NOTE — Assessment & Plan Note (Signed)
By initial history (patient and sister), patient appears to have been activated by the SSRI (fluoxetine 20mg ) that was started in November 2013.  She has had limited sleep, has restarted smoking, had racing thoughts.  Denies SI/HI at this time, but with passive death wish.  Will stop SSRI at this time due to concern for bipolar d/o as the underlying cause of her depression. In discussing recommendations for treatment/mood stabilization, I explained my recommendation to start patient on quetiapine at bedtime; least expensive monthly cost of around $40 at [facility].  Plans to give prescription for patient to take to MAP program at [facility] for future supply.  Patient and sister state this is untenable for them financially; the patient then modifies her story, saying that in fact she did feel better on fluoxetine and would prefer to stay on this med, as it is free [facility] the [facility].  After researching availability and cost of other mood stabilizing drugs, we are settling on carbamazepine twice-daily, Rx sent to [facility] on [address] for $4.  Patient and sister ask for a med that is free at the [employer]; they are provided with a paper Rx to take to the health dept to see if it is available.  Patient is also given instructions to call Dr Spero Geralds to schedule an appointment with [facility] Disorders [facility] for more definitive diagnosis.  She is asked to schedule a follow up appointment with her primary physician, Dr Ashley Royalty, in 2 weeks.

## 2012-09-11 NOTE — Progress Notes (Signed)
  Subjective:    Patient ID: [name], female    DOB: 12/27/1960, 52 y.o.   MRN: [id number]  HPI Patient here for hypertension follow-up, last seen in end-November by her Primary Physician, [name].  She is accompanied today by her sister, who serves as a historian as well.  Patient reports she has been taking her metoprolol as well as lisinopril that was prescribed at last visit 2 months ago, has tolerated well.  No chest pain or shortness of breath.  Reports that she has been taking fluoxetine 20mg  daily that was prescribed at last visit.  Since then, patient and her sister report that the patient has had very little sleep, and has had racing thoughts often.  [name] reports initially that she thinks she 'needs more of the medicine' because her depressive symptoms are not well-controlled.  She admits to some passive death-wish, but denies active thoughts of self-harm or plan to harm herself or others.  She admits to taking up smoking again after years of abstinence.  Family history of depression in the sister who is present today; no family members diagnosed with bipolar d/o according to patient and sister today.  She denies alcohol use in the past 2 weeks, but had drunk "80-proof" and beer previous to that ("not daily", but unable to quantify).  No other illicit drugs.  No auditory or visual hallucinations.   Review of Systems See above.  Denies abdominal pain, but has had more stomach gurgling than usual.    Objective:   Physical Exam Alert, pleasant, no apparent distress.  HEENT Neck supple. Affect pleasant; no pressured speech.  Appropriate responses to questions.       Assessment & Plan:

## 2012-09-12 ENCOUNTER — Encounter: Payer: Self-pay | Admitting: Family Medicine

## 2012-09-18 ENCOUNTER — Telehealth: Payer: Self-pay | Admitting: Psychology

## 2012-09-18 NOTE — Telephone Encounter (Signed)
Patient left VM to schedule MDC appointment.  First available is:  March 19th.  We scheduled for 10:00.  Discussed referral to [facility] but she would prefer to come here.    I told her the following: -  If she is unable to make the appointment she needs to call me. -  If she misses the appointment without a phone call, I won't be able to schedule her back in my clinic. She voiced an understanding and was able to repeat the appointment date and time back to me.

## 2012-09-23 ENCOUNTER — Ambulatory Visit: Payer: No Typology Code available for payment source | Admitting: Family Medicine

## 2012-10-07 ENCOUNTER — Ambulatory Visit: Payer: No Typology Code available for payment source | Admitting: Family Medicine

## 2012-10-30 ENCOUNTER — Ambulatory Visit (INDEPENDENT_AMBULATORY_CARE_PROVIDER_SITE_OTHER): Payer: No Typology Code available for payment source | Admitting: Psychology

## 2012-10-30 DIAGNOSIS — F39 Unspecified mood [affective] disorder: Secondary | ICD-10-CM

## 2012-10-30 DIAGNOSIS — F1020 Alcohol dependence, uncomplicated: Secondary | ICD-10-CM

## 2012-10-30 NOTE — Patient Instructions (Addendum)
Please call me at [phone] when you find out how you are going to move forward with your treatment for alcohol abuse. Alcohol and Drug Services can be reached at [phone].  Ask to speak to Selena Batten or Byrd Hesselbach and they will schedule an appointment. Stopping drinking without help could be very dangerous and we are not recommending that. After you have stopped drinking, we can have a better idea of how to help your mental health through medications and / or therapy. Call me with questions or concerns.

## 2012-10-30 NOTE — Assessment & Plan Note (Addendum)
Diagnosis unable to be made secondary to severe chronic alcohol dependence.  Suspect mood / anxiety issue.  No reported family history.  No reported early age of onset.  Treatment in the past has likely been in the context of significant alcohol abuse so would not necessarily count those medications as failures.  Dr. Kathrynn Running did not recommend restarting carbamazepine.  [name] has been out of this medicine for at least a week.  Will revisit medication options when alcohol dependence is addressed.
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
## 2012-10-30 NOTE — Assessment & Plan Note (Addendum)
Per Dr. Kathrynn Running:  [name] exhibits a widened gate, difficulty getting out of a seated position with unsteadiness.  Generalized muscle atrophy particularly temporal and intrinsic muscles of the hand.  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   Per Treatment team:  [name] voiced a high level of motivation to quit drinking and a high level of confidence.  Recommended [facility] Drug Services for an evaluation and treatment recommendations.  Warned her of the possible dangers of stopping drinking without medical supervision.  Will defer further assessment and treatment of mood issues until after treatment for alcohol dependence.

## 2012-11-08 ENCOUNTER — Ambulatory Visit: Payer: No Typology Code available for payment source | Admitting: Family Medicine

## 2012-11-19 ENCOUNTER — Ambulatory Visit: Payer: No Typology Code available for payment source | Admitting: Family Medicine

## 2012-11-20 ENCOUNTER — Ambulatory Visit (INDEPENDENT_AMBULATORY_CARE_PROVIDER_SITE_OTHER): Payer: No Typology Code available for payment source | Admitting: Family Medicine

## 2012-11-20 ENCOUNTER — Encounter: Payer: Self-pay | Admitting: Family Medicine

## 2012-11-20 VITALS — BP 181/91 | HR 63 | Ht 67.0 in | Wt 109.0 lb

## 2012-11-20 DIAGNOSIS — F39 Unspecified mood [affective] disorder: Secondary | ICD-10-CM

## 2012-11-20 DIAGNOSIS — I10 Essential (primary) hypertension: Secondary | ICD-10-CM

## 2012-11-20 MED ORDER — METOPROLOL TARTRATE 50 MG PO TABS: 50.0000 mg | ORAL_TABLET | Freq: Two times a day (BID) | ORAL | Status: DC

## 2012-11-20 MED ORDER — LISINOPRIL 40 MG PO TABS: 40.0000 mg | ORAL_TABLET | Freq: Every day | ORAL | Status: DC

## 2012-11-20 NOTE — Patient Instructions (Addendum)
  General Instructions: Thank you for coming in today, it was good to see you Alcohol and Drug Services can be reached at [phone]. Ask to speak to Selena Batten or Byrd Hesselbach and they will schedule an appointment.  Stopping drinking without help could be very dangerous and we are not recommending that. Follow up with Dr. Pascal Lux after you have decided what you are going to do about your drinking habits   Treatment Goals:  Goals (1 Years of Data) as of 11/20/12         As of Today 09/10/12 09/10/12 07/10/12 06/27/12     Blood Pressure    . Blood Pressure < 140/90  181/91 160/96 165/99 178/100 160/98      Progress Toward Treatment Goals:  Treatment Goal 11/20/2012  Blood pressure unchanged    Self Care Goals & Plans:  Self Care Goal 11/20/2012  Manage my medications take my medicines as prescribed; bring my medications to every visit  Eat healthy foods eat more vegetables; eat foods that are low in salt  Be physically active find time in my schedule; find a convenient safe place to exercise       Care Management & Community Referrals:  Referral 11/20/2012  Referrals made for care management support health educator

## 2012-11-25 NOTE — Progress Notes (Signed)
  Subjective:    Patient ID: [name], female    DOB: [birth year], 52 y.o.   MRN: [id number]  HPI  1. HTN:  Here for follow up of HTN.  She brings her medication today and her lisinopril bottle is empty. She reports she has not taken this in the past month.  She denies headache, chest pain, palpitations, vision changes.  She does not monitor her BP  At home and does not make any dietary changes to help with BP control.    2. Mood disorder/EtOH abuse:  Was taking sertraline previously but began having symptoms consistent with mania.  Has been evaluated in mood disorder clinic and started on carbamazepine but this was discontinued due to her current EtOH abuse.  She continues to drink but reports that she has "cut back" to 3-4 beers per day.  She has not called Drug and Alcohol Services, and seem ambiguous to do so.  She denies any tremor, shakes, sweats, hallucinations.     Review of Systems Per HPI    Objective:   Physical Exam  Constitutional:  Thin female, nad   HENT:  Head: Normocephalic and atraumatic.  Cardiovascular: Normal rate and regular rhythm.   Pulmonary/Chest: Effort normal and breath sounds normal.  Musculoskeletal: She exhibits no edema.  Neurological: She is alert.  Psychiatric: She has a normal mood and affect. Thought content normal.  No pressured speech, behavior seems normal.           Assessment & Plan:

## 2012-11-25 NOTE — Assessment & Plan Note (Signed)
She seems ambiguous today about quitting drinking.  She has not called Drug and Alcohol center, but says that she will. Number given again.  Will not initiate any other mood altering medications until her EtOH abuse is being treated.  I discussed with her that this is a decision that she is going to have to make herself but that I would strongly recommend treatment.

## 2012-11-25 NOTE — Assessment & Plan Note (Signed)
Using only metoprolol at this time, out of lisonopril x1 month.  Refill faxed to [facility], will have her return in one month to recheck BP.

## 2012-12-23 ENCOUNTER — Ambulatory Visit: Payer: Self-pay | Admitting: Family Medicine

## 2013-02-07 ENCOUNTER — Ambulatory Visit: Payer: Self-pay | Admitting: Family Medicine

## 2013-02-24 ENCOUNTER — Ambulatory Visit (INDEPENDENT_AMBULATORY_CARE_PROVIDER_SITE_OTHER): Payer: Self-pay | Admitting: Family Medicine

## 2013-02-24 ENCOUNTER — Encounter: Payer: Self-pay | Admitting: Family Medicine

## 2013-02-24 VITALS — BP 170/102 | HR 61 | Temp 98.0°F | Ht 67.0 in | Wt 104.6 lb

## 2013-02-24 DIAGNOSIS — F39 Unspecified mood [affective] disorder: Secondary | ICD-10-CM

## 2013-02-24 DIAGNOSIS — F1020 Alcohol dependence, uncomplicated: Secondary | ICD-10-CM

## 2013-02-24 DIAGNOSIS — I10 Essential (primary) hypertension: Secondary | ICD-10-CM

## 2013-02-24 MED ORDER — HYDROCHLOROTHIAZIDE 25 MG PO TABS: 25.0000 mg | ORAL_TABLET | Freq: Every day | ORAL | Status: DC

## 2013-02-24 NOTE — Assessment & Plan Note (Signed)
Continues to have chronic alcohol dependence, reports to trying to "cut back" but still drinking a few beers. Expresses concern for seeking treatment of mood disorder, with reported insomnia, decreased motivation, energy, feeling "depressed", and anxiety. Denies suicidal / homicidal ideation.  Discussed that her EtOH substance abuse complicates this problem, and for her safety and best treatment results she would need to seek help to treat her alcohol dependence first, before treatment could be initiated for her mood disorder. Provided contact info for Dr. Pascal Lux and [facility], and strongly advised follow-up.

## 2013-02-24 NOTE — Assessment & Plan Note (Signed)
Poorly controlled. BP today 170/102, HR 61. Continues to take Lisinopril 40mg  daily, Lopressor 50mg  BID. Reports good compliance to meds. Does not check BP at home. Denies any complaints, no HA, lightheadedness, dizziness, edema, CP, SoB, weakness, or neurological findings. Previous hx on HCTZ, but was d/c'd due to episode of ARF.  Start on HCTZ 25mg  daily, in addition to current BP therapy. Discussed possibility of combination therapy in future if helping. Schedule follow-up to check BP in 2 weeks.  If BP still elevated, consider adding Amlodipine 10mg  daily (4th BP agent), and possibly 24 hr BP monitoring apt. Encourage BP checking at home / [employer].

## 2013-02-24 NOTE — Patient Instructions (Addendum)
Thanks for coming in. It was great to meet you today!  Today we discussed your high blood pressure.  Some important numbers from today's visit: BP - 170/102  Your blood pressure is still significantly high. Please continue taking your current medications, plus the new BP medication. Daily walking for exercise is very helpful in lowering your pressure. Also, reduce salt in your diet, and increase your fruits/vegetables.  We started a new medication today to help your blood pressure. Hydrochlorothiazide (HCTZ), please take one 25mg  tablet once daily in the morning.  We get you an appointment to check your BP in 2 weeks. If your pressures are still elevated, we may need to add a 4th BP medication. But we will schedule an appointment to see me if that is the case.  We also discussed your current drinking habits. I am very pleased that you have continued to cut back on your alcohol and only drink a few beers now, but for your safety and to help you the most we strongly encourage you to get help and be completely alcohol free before we treat your mood.  Here is the phone # for Dr. Pascal Lux to call for another follow-up appointment - [phone] She had recommended that you call Alcohol and Drug Services at [phone] and speak to Selena Batten or Byrd Hesselbach to make an appointment for help and treatment to stop your alcohol use.  If you have any other questions or concerns, please feel free to call the clinic to contact me. You may also schedule another appointment if necessary.  However, if your symptoms get significantly worse, please go to the Emergency Department to seek immediate medical attention.  Saralyn Pilar, DO [facility]

## 2013-02-24 NOTE — Assessment & Plan Note (Signed)
Reports continues to cut back on alcohol (mostly drinks beer) to few drinks, not everyday. She reports goes a few days to a week without drinking now. She is aware that quitting will be the best thing for her health. Limited discussion on her drinking habits and plans to quit. No signs of withdrawal today.  Provided contact info for Dr. Pascal Lux and [facility]. Strongly encouraged her to follow-up and seek help in treating her alcohol dependence, prior to treating her mood disorder.

## 2013-02-24 NOTE — Progress Notes (Signed)
Subjective:     Patient ID: [name], female   DOB: March 17, 1961, 52 y.o.   MRN: [id number]  HPI  HYPERTENSION Disease Monitoring - elevated BP today 170/102 (re-checked, manually) Home BP Monitoring - none Chest pain- none    Dyspnea- none Medications- Lisinopril 40mg  daily, Lopressor 50mg  BID Compliance-  Yes, brought bottles to visit. Has 2x refills on each.  Headache - none     Lightheadedness-  none    Edema- none     Dizziness - none  CHRONIC ALCOHOL DEPENDENCE Reports continues to drink a few beers weekly, sometimes will go few days to a week without drinking. Continues to try to "cut back", but has not been able to completely quit. No symptoms of withdrawal at this time.  MOOD DISORDER Last seen by Dr. Pascal Lux in [facility] 10/2012. Reports that she would like to start back on a medication for her mood. Reports feeling "depressed", with more bad days than good, insomnia, anxiety, decreased motivation and energy. Previously, unable to be treated due to complication of chronic alcohol dependence. Has not contacted [facility] for treatment. Denies suicidal / homicidal ideation.   Social Hx - Former smoker (quit 2002), Current drinker with Alcohol Dependence - drinks 2-3 beers few times a week.    Review of Systems  See above HPI.     Objective:   Physical Exam  BP 170/102  Pulse 61  Temp(Src) 98 F (36.7 C) (Oral)  Ht 5\' 7"  (1.702 m)  Wt 104 lb 9.6 oz (47.446 kg)  BMI 16.38 kg/m2  General - pleasant, thin appears malnourished, NAD HEENT: PERRLA, EOMI.  MMM, pharynx clear Neck - supple, non-tender, no masses, no thyromegaly Heart - Regular rate and rhythm.  No murmurs, gallops or rubs.    Lungs:  Normal respiratory effort, chest expands symmetrically. Lungs are clear to auscultation, no crackles or wheezes. Ext - peripheral pulses 2+, no edema, no cyanosis Neuro - grossly non-focal, muscle str 5/5 bilateral extremities, sensation to light touch intact  distally Psych - alert, oriented, good eye contact, expresses normal insight into health

## 2013-03-11 ENCOUNTER — Ambulatory Visit: Payer: Self-pay | Admitting: Family Medicine

## 2013-03-25 ENCOUNTER — Ambulatory Visit: Payer: Self-pay

## 2013-06-06 ENCOUNTER — Ambulatory Visit (INDEPENDENT_AMBULATORY_CARE_PROVIDER_SITE_OTHER): Payer: Self-pay | Admitting: Family Medicine

## 2013-06-06 ENCOUNTER — Encounter: Payer: Self-pay | Admitting: Family Medicine

## 2013-06-06 VITALS — BP 181/115 | HR 80 | Temp 99.2°F | Ht 67.0 in | Wt 105.0 lb

## 2013-06-06 DIAGNOSIS — I10 Essential (primary) hypertension: Secondary | ICD-10-CM

## 2013-06-06 DIAGNOSIS — F1020 Alcohol dependence, uncomplicated: Secondary | ICD-10-CM

## 2013-06-06 DIAGNOSIS — F39 Unspecified mood [affective] disorder: Secondary | ICD-10-CM

## 2013-06-06 MED ORDER — LISINOPRIL 40 MG PO TABS: 40.0000 mg | ORAL_TABLET | Freq: Every day | ORAL | Status: DC

## 2013-06-06 MED ORDER — METOPROLOL TARTRATE 50 MG PO TABS: 50.0000 mg | ORAL_TABLET | Freq: Two times a day (BID) | ORAL | Status: DC

## 2013-06-06 MED ORDER — HYDROCHLOROTHIAZIDE 25 MG PO TABS: 25.0000 mg | ORAL_TABLET | Freq: Every day | ORAL | Status: DC

## 2013-06-06 NOTE — Patient Instructions (Addendum)
Dear [name], Thank you for coming in to clinic today. It was good to see you again!  Today we discussed your Blood Pressure, Alcohol use, and Mood. 1. Your blood pressure is significantly elevated today. As we discussed, this is most likely due to a number of things, including stress and your alcohol use.  2. Also, I'm sorry about that last blood pressure medication problem, hopefully you will be able to get all of your medications this time. 2. Please take all of your blood pressure medications each day, and we can re-check your pressure soon to see if they are working. 3. For your Alcohol use, I think that your overall health will greatly benefit from going to [facility] to be evaluated. They have lots of therapists and group sessions that can help you cut back and quit. If you can reduce your alcohol drinking, then your blood pressure and your mood will significantly improve.  We started a new medication today to help your Blood Pressure. Hydrochlorothiazide 25 mg, please take 1 tablet once in the morning as we discussed.  Blood Pressure Med List: 1. Hydrochlorothiazide 25mg  - take 1 in the morning 2. Lisinopril 40mg  - take 1 in the morning 3. Metoprolol 50mg  - take 1 in the morning AND 1 in the evening (12 hours later)  Some important numbers from today's visit: BP - 171/101  Please schedule a follow-up appointment with me in 2 weeks to 1 month to re-check blood pressure.  Please call [facility] at [phone] and speak to make an appointment or get information on how to be seen for help and treatment to stop your alcohol use.  If you are unable to get seen by [facility], here is the phone # for Dr. Pascal Lux to call for another follow-up appointment - [phone]   If you have any other questions or concerns, please feel free to call the clinic to contact me. You may also schedule an earlier appointment if necessary.  However, if your  symptoms get significantly worse or you develop Chest Pain, Shortness of Breath, Lightheaded/Dizziness and feel like you are going to pass out, loss of vision, please go to the Emergency Department to seek immediate medical attention.  Saralyn Pilar, DO [facility]

## 2013-06-06 NOTE — Assessment & Plan Note (Signed)
Unclear specific mood disorder (unipolar vs bipolar depression?), due to confounding alcohol dependence. Prior psychiatric treatment, multiple med failures (questionable, and while alcohol dependent)  Plan: 1. Goal to treat alcohol dependence first, as pt expresses clinically stable mood without suicidal or homicidal ideation 2. Encourage pt to seek assistance with ADS, and alternatively with Dr. Pascal Lux at [facility] Counseling if no success with ADS 3. Will consider alternative community resources ([place], [facility]) if needed.

## 2013-06-06 NOTE — Assessment & Plan Note (Signed)
Continues to drink alcohol (reports significantly reduced amounts) Suspect alcohol dependence (but reports able to go 1-2 weeks w/o drink) Previous apt given ADS info (did not contact), Prior hx of AA Interested in abstinence  Plan: 1. Provided phone # and info for [facility] - to contact and get established for available services 2. If unable to afford or follow-through with ADS, encouraged pt to return to clinic and provided info to schedule counseling with Dr. Pascal Lux 3. Will consider other alternatives if no success with ADS - [facility] [place], [place]

## 2013-06-06 NOTE — Assessment & Plan Note (Addendum)
Very poorly-controlled HTN, BP today 181/115. Re-checked at 171/101  Meds - Lisinopril 40mg  daily, Metoprolol 50mg  BID (only taking daily), HCTZ 25mg  (never filled) Poor med compliance, likely secondary to multiple factors (poor health literacy, cost, transportation) No complications   Plan:  1. Start HCTZ 25mg  daily 2. Take Metoprolol BID (as prescribed) 3. Printed refills and rx of all 3 BP meds (will take rx to [facility]) 4. Encouraged lifestyle changes - Decrease alcohol, inc exercise, dec salt, inc K+ veggies,  5. Last CMET (08/2012) Cr stable, plan to re-check in 2-3 months 6. Follow-up in 2 weeks to 1 month to re-check BP.  Future Consider: 1. If BP remains elevated (and confirmed that pt is adherent to all 3 BP meds as prescribed), then consider adding CCB Amlodipine 10mg  daily, and consider Clonidine at 0.1mg 

## 2013-06-06 NOTE — Progress Notes (Signed)
Subjective:     Patient ID: [name], female   DOB: 1960-12-16, 52 y.o.   MRN: [id number]  HPI  CHRONIC HTN: Elevated BP today, 181/115, manually re-checked at 171/101. Reports - she was unaware of her new rx for HCTZ 25mg  from last visit and never filled it. No new concerns today. Does not check BP outside of clinic  Current Meds - Lisinopril 40mg  daily, Metoprolol 50mg  BID (admits only taking daily)   Reports poor compliance with meds. Tolerating well, w/o complaints. Lifestyle - minimal daily exercise, limits salt in diet, irregular eating habits x2 meals daily (mostly home cooked) Denies CP, dyspnea, claudication, HA, edema, dizziness / lightheadedness, vision changes  ALCOHOL ABUSE: Reports chronic hx of significant alcohol abuse and dependence Continues to currently drink, mostly liquor, not every night (states she is able to go 1-2 weeks without alcohol), when she does drink reports 2-4oz nightly Interested in completely abstaining from alcohol. Prior hx of AA, not currently attending support group Did not contact [facility] (given info at prior apts) CAGE (2/4 - told to cut back, hx of agitated) Denies any shaking, tremors  MOOD DISORDER: Reports feeling depressed or "down" due to current life stressors (primarily no job, and no stable living situation). Admits feeling decreased energy and motivation. Hx of psychiatric treatment - with multiple failed medications (unclear circumstances)  Social Hx: Continued alcohol use. Former smoker Currently living with Sister at her house (x 1 week, and does not expect to stay there for long period of time). Searching for job (has tried to be active, noted multiple rejections so far).   Review of Systems  See above HPI     Objective:   Physical Exam  BP 181/115  Pulse 80  Temp(Src) 99.2 F (37.3 C) (Oral)  Ht 5\' 7"  (1.702 m)  Wt 105 lb (47.628 kg)  BMI 16.44 kg/m2  BP Re-checked manually at  171/101  General - pleasant, thin appears malnourished, NAD  HEENT: PERRLA, EOMI, MMM Neck - supple, non-tender, no masses, no thyromegaly, no carotid bruits heard Heart - RRR. No murmurs Lungs: CTAB, no wheezing, crackles, or rhonchi. Normal respiratory effort Ext - no edema, peripheral pulses 2+ Neuro - alert, oriented, grossly non-focal, CN-II-XII intact, muscle str 5/5 bilateral extremities, sensation to light touch intact distally  Psych - appropriate affect, normal speech rate and thought content, appropriate emotional range, good eye contact, demonstrates insight into health     Assessment:     See specific A&P problem list for details.      Plan:     See specific A&P problem list for details.

## 2013-06-09 ENCOUNTER — Emergency Department (HOSPITAL_COMMUNITY)
Admission: EM | Admit: 2013-06-09 | Discharge: 2013-06-09 | Disposition: A | Discharge status: Home / Self Care | Payer: Self-pay | Attending: Emergency Medicine | Admitting: Emergency Medicine

## 2013-06-09 ENCOUNTER — Encounter (HOSPITAL_COMMUNITY): Payer: Self-pay | Admitting: Emergency Medicine

## 2013-06-09 DIAGNOSIS — I10 Essential (primary) hypertension: Secondary | ICD-10-CM | POA: Insufficient documentation

## 2013-06-09 DIAGNOSIS — F1020 Alcohol dependence, uncomplicated: Secondary | ICD-10-CM | POA: Insufficient documentation

## 2013-06-09 DIAGNOSIS — Z87891 Personal history of nicotine dependence: Secondary | ICD-10-CM | POA: Insufficient documentation

## 2013-06-09 DIAGNOSIS — Z79899 Other long term (current) drug therapy: Secondary | ICD-10-CM | POA: Insufficient documentation

## 2013-06-09 DIAGNOSIS — Z853 Personal history of malignant neoplasm of breast: Secondary | ICD-10-CM | POA: Insufficient documentation

## 2013-06-09 DIAGNOSIS — Z8719 Personal history of other diseases of the digestive system: Secondary | ICD-10-CM | POA: Insufficient documentation

## 2013-06-09 LAB — CBC
HCT: 36.6 % (ref 36.0–46.0)
Hemoglobin: 13.2 g/dL (ref 12.0–15.0)
MCH: 34.9 pg — ABNORMAL HIGH (ref 26.0–34.0)
MCV: 96.8 fL (ref 78.0–100.0)
Platelets: 176 10*3/uL (ref 150–400)
RBC: 3.78 MIL/uL — ABNORMAL LOW (ref 3.87–5.11)

## 2013-06-09 LAB — COMPREHENSIVE METABOLIC PANEL
AST: 29 U/L (ref 0–37)
CO2: 29 mEq/L (ref 19–32)
Calcium: 10.1 mg/dL (ref 8.4–10.5)
Creatinine, Ser: 0.66 mg/dL (ref 0.50–1.10)
GFR calc Af Amer: >90 mL/min (ref >90)
GFR calc non Af Amer: >90 mL/min (ref >90)
Glucose, Bld: 138 mg/dL — ABNORMAL HIGH (ref 70–99)

## 2013-06-09 LAB — ACETAMINOPHEN LEVEL: Acetaminophen (Tylenol), Serum: <15 ug/mL (ref 10–30)

## 2013-06-09 LAB — ETHANOL: Alcohol, Ethyl (B): <11 mg/dL (ref 0–11)

## 2013-06-09 LAB — SALICYLATE LEVEL: Salicylate Lvl: <2 mg/dL — ABNORMAL LOW (ref 2.8–20.0)

## 2013-06-09 MED ORDER — POTASSIUM CHLORIDE CRYS ER 20 MEQ PO TBCR
40.0000 meq | EXTENDED_RELEASE_TABLET | Freq: Once | ORAL | Status: AC
  Administered 2013-06-09: 40 meq via ORAL
  Filled 2013-06-09: qty 2

## 2013-06-09 NOTE — ED Notes (Signed)
Pt here requesting detox from ETOH; pt sts last drank 2 days ago and drinks a pint to a fifth a day; pt denies SI/HI or any other drug use

## 2013-06-09 NOTE — ED Provider Notes (Signed)
CSN: [id number]     Arrival date & time 06/09/13  1428 History   First MD Initiated Contact with Patient 06/09/13 1847     Chief Complaint  Patient presents with  . Medical Clearance   (Consider location/radiation/quality/duration/timing/severity/associated sxs/prior Treatment) HPI Requesting help with her alcohol problem. Last alcohol intake was 2 days ago. She is asymptomatic and states "I feel fine" no treatment prior to coming here. No other associated symptoms. Past Medical History  Diagnosis Date  . Hypertension   . Alcoholic hepatitis   . Alcoholism   . Breast cancer 2001    Rt side. Had lumpectomy, chemo and XRT by [facility]  . Shortness of breath    Past Surgical History  Procedure Laterality Date  . Rt lumpectomy  2001   History reviewed. No pertinent family history. History  Substance Use Topics  . Smoking status: Former Smoker -- 0.50 packs/day    Types: Cigarettes    Quit date: 11/02/2000  . Smokeless tobacco: Never Used  . Alcohol Use: 25.2 oz/week    42 Shots of liquor per week   OB History   Grav Para Term Preterm Abortions TAB SAB Ect Mult Living                 Review of Systems  Constitutional: Negative.   HENT: Negative.   Respiratory: Negative.   Cardiovascular: Negative.   Gastrointestinal: Negative.   Musculoskeletal: Negative.   Skin: Negative.   Neurological: Negative.   Psychiatric/Behavioral: Negative.     Allergies  Review of patient's allergies indicates no known allergies.  Home Medications   Current Outpatient Rx  Name  Route  Sig  Dispense  Refill  . folic acid (FOLVITE) 1 MG tablet   Oral   Take 1 mg by mouth daily.         . hydrochlorothiazide (HYDRODIURIL) 25 MG tablet   Oral   Take 25 mg by mouth daily.         Marland Kitchen lisinopril (PRINIVIL,ZESTRIL) 40 MG tablet   Oral   Take 40 mg by mouth daily.         . metoprolol (LOPRESSOR) 50 MG tablet   Oral   Take 50 mg by mouth 2 (two) times daily.         . Multiple  Vitamin (MULTIVITAMIN) tablet   Oral   Take 1 tablet by mouth daily.         Marland Kitchen thiamine 100 MG tablet   Oral   Take 100 mg by mouth daily.          BP 152/99  Pulse 91  Temp(Src) 99.4 F (37.4 C) (Oral)  Resp 18  Ht 5\' 7"  (1.702 m)  Wt 106 lb 8 oz (48.308 kg)  BMI 16.68 kg/m2  SpO2 99% Physical Exam  Nursing note and vitals reviewed. Constitutional: She appears well-developed and well-nourished.  HENT:  Head: Normocephalic and atraumatic.  Eyes: Conjunctivae are normal. Pupils are equal, round, and reactive to light.  Neck: Neck supple. No tracheal deviation present. No thyromegaly present.  Cardiovascular: Normal rate and regular rhythm.   No murmur heard. Pulmonary/Chest: Effort normal and breath sounds normal.  Abdominal: Soft. Bowel sounds are normal. She exhibits no distension. There is no tenderness.  Musculoskeletal: Normal range of motion. She exhibits no edema and no tenderness.  Neurological: She is alert. Coordination normal.  Skin: Skin is warm and dry. No rash noted.  Psychiatric: She has a normal mood and affect.  ED Course  Procedures (including critical care time) Labs Review Labs Reviewed  CBC - Abnormal; Notable for the following:    RBC 3.78 (*)    MCH 34.9 (*)    MCHC 36.1 (*)    All other components within normal limits  COMPREHENSIVE METABOLIC PANEL - Abnormal; Notable for the following:    Potassium 3.2 (*)    Glucose, Bld 138 (*)    Total Protein 8.7 (*)    All other components within normal limits  SALICYLATE LEVEL - Abnormal; Notable for the following:    Salicylate Lvl <2.0 (*)    All other components within normal limits  ACETAMINOPHEN LEVEL  ETHANOL  URINE RAPID DRUG SCREEN (HOSP PERFORMED)   Imaging Review No results found.  EKG Interpretation   None      Results for orders placed during the hospital encounter of 06/09/13  ACETAMINOPHEN LEVEL      Result Value Range   Acetaminophen (Tylenol), Serum <15.0  10 - 30  ug/mL  CBC      Result Value Range   WBC 5.0  4.0 - 10.5 K/uL   RBC 3.78 (*) 3.87 - 5.11 MIL/uL   Hemoglobin 13.2  12.0 - 15.0 g/dL   HCT 09.8  11.9 - 14.7 %   MCV 96.8  78.0 - 100.0 fL   MCH 34.9 (*) 26.0 - 34.0 pg   MCHC 36.1 (*) 30.0 - 36.0 g/dL   RDW 82.9  56.2 - 13.0 %   Platelets 176  150 - 400 K/uL  COMPREHENSIVE METABOLIC PANEL      Result Value Range   Sodium 137  135 - 145 mEq/L   Potassium 3.2 (*) 3.5 - 5.1 mEq/L   Chloride 98  96 - 112 mEq/L   CO2 29  19 - 32 mEq/L   Glucose, Bld 138 (*) 70 - 99 mg/dL   BUN 15  6 - 23 mg/dL   Creatinine, Ser 8.65  0.50 - 1.10 mg/dL   Calcium 78.4  8.4 - 69.6 mg/dL   Total Protein 8.7 (*) 6.0 - 8.3 g/dL   Albumin 3.8  3.5 - 5.2 g/dL   AST 29  0 - 37 U/L   ALT 22  0 - 35 U/L   Alkaline Phosphatase 70  39 - 117 U/L   Total Bilirubin 0.9  0.3 - 1.2 mg/dL   GFR calc non Af Amer >90  >90 mL/min   GFR calc Af Amer >90  >90 mL/min  ETHANOL      Result Value Range   Alcohol, Ethyl (B) <11  0 - 11 mg/dL  SALICYLATE LEVEL      Result Value Range   Salicylate Lvl <2.0 (*) 2.8 - 20.0 mg/dL   No results found.   MDM  No diagnosis found. Patient is exhibiting no signs of withdrawal. She is in agreement with outpatient therapy for alcohol abuse. She really furnished with facilities for alcohol treatment counseling. She is encouraged to follow up with family practice Center for investigation of hyperglycemia. Potassium chloride 40 mEq administered prior to discharge blood pressure recheck one week Diagnosis #1 alcoholism #2 hypokalemia 3 hyperglycemia #4 hypertension    Doug Sou, MD 06/09/13 1924

## 2013-06-26 ENCOUNTER — Encounter (HOSPITAL_BASED_OUTPATIENT_CLINIC_OR_DEPARTMENT_OTHER): Payer: Self-pay | Admitting: Emergency Medicine

## 2013-06-26 ENCOUNTER — Emergency Department (HOSPITAL_BASED_OUTPATIENT_CLINIC_OR_DEPARTMENT_OTHER)
Admission: EM | Admit: 2013-06-26 | Discharge: 2013-06-26 | Disposition: A | Discharge status: Home / Self Care | Payer: Self-pay | Attending: Emergency Medicine | Admitting: Emergency Medicine

## 2013-06-26 DIAGNOSIS — Z8719 Personal history of other diseases of the digestive system: Secondary | ICD-10-CM | POA: Insufficient documentation

## 2013-06-26 DIAGNOSIS — I10 Essential (primary) hypertension: Secondary | ICD-10-CM | POA: Insufficient documentation

## 2013-06-26 DIAGNOSIS — Z853 Personal history of malignant neoplasm of breast: Secondary | ICD-10-CM | POA: Insufficient documentation

## 2013-06-26 DIAGNOSIS — Z87891 Personal history of nicotine dependence: Secondary | ICD-10-CM | POA: Insufficient documentation

## 2013-06-26 DIAGNOSIS — Z79899 Other long term (current) drug therapy: Secondary | ICD-10-CM | POA: Insufficient documentation

## 2013-06-26 MED ORDER — LISINOPRIL 10 MG PO TABS
40.0000 mg | ORAL_TABLET | Freq: Once | ORAL | Status: AC
  Administered 2013-06-26: 40 mg via ORAL
  Filled 2013-06-26: qty 4

## 2013-06-26 MED ORDER — METOPROLOL TARTRATE 50 MG PO TABS
50.0000 mg | ORAL_TABLET | Freq: Once | ORAL | Status: AC
  Administered 2013-06-26: 50 mg via ORAL
  Filled 2013-06-26: qty 1

## 2013-06-26 MED ORDER — HYDROCHLOROTHIAZIDE 25 MG PO TABS
25.0000 mg | ORAL_TABLET | Freq: Once | ORAL | Status: AC
  Administered 2013-06-26: 25 mg via ORAL
  Filled 2013-06-26: qty 1

## 2013-06-26 NOTE — ED Notes (Signed)
Pt going into [facility] today they brought her here due to her blood pressure being elevated. Pt states she takes HCTZ and Metoprolol but has been out for over a week.. They told her at [facility] they will provide the meds starting today at 5 pm

## 2013-06-26 NOTE — ED Provider Notes (Signed)
CSN: [id number]     Arrival date & time 06/26/13  1100 History   First MD Initiated Contact with Patient 06/26/13 1113     Chief Complaint  Patient presents with  . high blood pressure    (Consider location/radiation/quality/duration/timing/severity/associated sxs/prior Treatment) HPI Comments: Patient is a 52 year old female past medical history of hypertension and alcoholism. She has been off of her antihypertensives for the past week as she ran out of her prescriptions. [facility] for [facility]-term alcohol rehabilitation this morning. They found her blood pressure markedly elevated and were uncomfortable accepting her into treatment until this was under control. She otherwise has no complaints. She denies any headache, chest pain, shortness of breath.  The history is provided by the patient.    Past Medical History  Diagnosis Date  . Hypertension   . Alcoholic hepatitis   . Alcoholism   . Breast cancer 2001    Rt side. Had lumpectomy, chemo and XRT by [facility]  . Shortness of breath    Past Surgical History  Procedure Laterality Date  . Rt lumpectomy  2001   History reviewed. No pertinent family history. History  Substance Use Topics  . Smoking status: Former Smoker -- 0.50 packs/day    Types: Cigarettes    Quit date: 11/02/2000  . Smokeless tobacco: Never Used  . Alcohol Use: No     Comment: stopped one month ago   OB History   Grav Para Term Preterm Abortions TAB SAB Ect Mult Living                 Review of Systems  All other systems reviewed and are negative.    Allergies  Review of patient's allergies indicates no known allergies.  Home Medications   Current Outpatient Rx  Name  Route  Sig  Dispense  Refill  . folic acid (FOLVITE) 1 MG tablet   Oral   Take 1 mg by mouth daily.         . hydrochlorothiazide (HYDRODIURIL) 25 MG tablet   Oral   Take 25 mg by mouth daily.         Marland Kitchen lisinopril (PRINIVIL,ZESTRIL) 40 MG tablet   Oral  Take 40 mg by mouth daily.         . metoprolol (LOPRESSOR) 50 MG tablet   Oral   Take 50 mg by mouth 2 (two) times daily.         . Multiple Vitamin (MULTIVITAMIN) tablet   Oral   Take 1 tablet by mouth daily.         Marland Kitchen thiamine 100 MG tablet   Oral   Take 100 mg by mouth daily.          BP 185/121  Pulse 80  Temp(Src) 98.9 F (37.2 C) (Oral)  Resp 16  Ht 5\' 7"  (1.702 m)  Wt 112 lb (50.803 kg)  BMI 17.54 kg/m2  SpO2 100% Physical Exam  Nursing note and vitals reviewed. Constitutional: She is oriented to person, place, and time. She appears well-developed and well-nourished. No distress.  HENT:  Head: Normocephalic and atraumatic.  Neck: Normal range of motion. Neck supple.  Cardiovascular: Normal rate and regular rhythm.  Exam reveals no gallop and no friction rub.   No murmur heard. Pulmonary/Chest: Effort normal and breath sounds normal. No respiratory distress. She has no wheezes.  Abdominal: Soft. Bowel sounds are normal. She exhibits no distension. There is no tenderness.  Musculoskeletal: Normal range  of motion.  Neurological: She is alert and oriented to person, place, and time.  Skin: Skin is warm and dry. She is not diaphoretic.    ED Course  Procedures (including critical care time) Labs Review Labs Reviewed - No data to display Imaging Review No results found.    MDM  No diagnosis found. Patient is a 52 year old female sent [facility] for concerns over elevated blood pressure. Initial blood pressure was 185/21. She was given her home meds which she has not taken in the past week and blood pressure is now 160/110. I feel as though this will continue to decrease and that she is stable for discharge. I do not believe any further workup is indicated as she has no other complaints. She is to be getting her medications delivered to [facility] where she is to begin alcohol treatment.    Geoffery Lyons, MD 06/26/13 [phone]

## 2013-06-26 NOTE — ED Notes (Signed)
Pt denies any pain or discomfort.

## 2013-07-07 ENCOUNTER — Ambulatory Visit: Payer: Self-pay | Admitting: Family Medicine

## 2013-10-10 ENCOUNTER — Ambulatory Visit: Payer: Self-pay

## 2013-10-30 NOTE — Progress Notes (Signed)
   CARE MANAGEMENT ED NOTE 10/30/2013  Patient:  Jenny, Strickland   Account Number:  [id number]  Date Initiated:  10/30/2013  Documentation initiated by:  Jackelyn Poling  Subjective/Objective Assessment:   53 yr old self pay pt came to [facility] ED without registration requesting to be seen to get set up for an orange card on [facility] floor 1 o2 to as she had been told by a staff member at [facility] or/and [facility] wellness center     Subjective/Objective Assessment Detail:   Pt was a previous orange card GCCN/P4CC pt OC expired 01/09/13 Pt last [facility] visit was in 2014     Action/Plan:   CM spoke with Caryl Pina at [facility] to redirect pt to list of agencies where she can get assist with orange card   Action/Plan Detail:   CM faxed Caryl Pina a list of locations for pt to go to with fax [phone]) confirmation   Anticipated DC Date:       Status Recommendation to Physician:   Result of Recommendation:    Other ED [facility] - Pt will follow up  Other  PCP issues  GCCN / P4HM (established/new)    Choice offered to / List presented to:            Status of service:  Completed, signed off  ED Comments:   ED Comments Detail:

## 2013-11-03 ENCOUNTER — Ambulatory Visit: Payer: Self-pay

## 2013-11-06 ENCOUNTER — Ambulatory Visit: Payer: Self-pay

## 2013-11-10 ENCOUNTER — Ambulatory Visit: Payer: Self-pay

## 2014-01-26 IMAGING — CT CT HEAD W/O CM
1 of 2 series · 15 of 30 positions shown, 19 images · non-contrast
Comparison: 11/25/2011 and earlier.

CLINICAL DATA: 51-year-old female status post fall with back of
head injury.
History of altered mental status.

CT HEAD WITHOUT CONTRAST
TECHNIQUE: Contiguous axial images were obtained from the base of
the skull through the vertex without contrast.

[Series 2: head routine 4.8 h37s · axial · 0.43mm/px · z∈[-157,-14]mm · 15 of 36 slices shown, 19 images]
[im 3/36  brain]
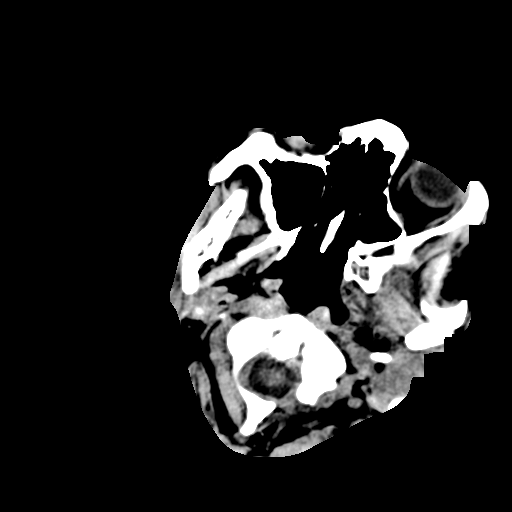
[im 3/36  bone]
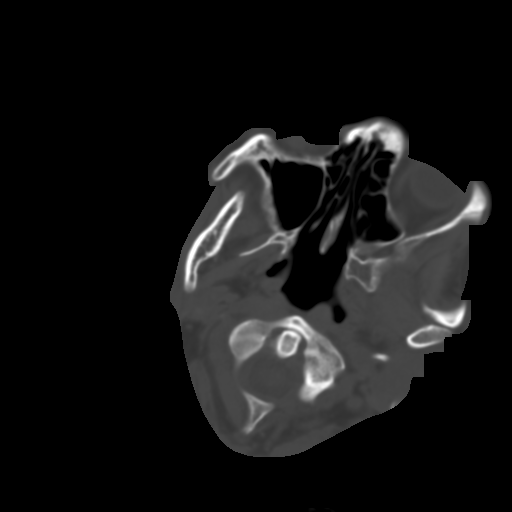
[im 5/36  brain]
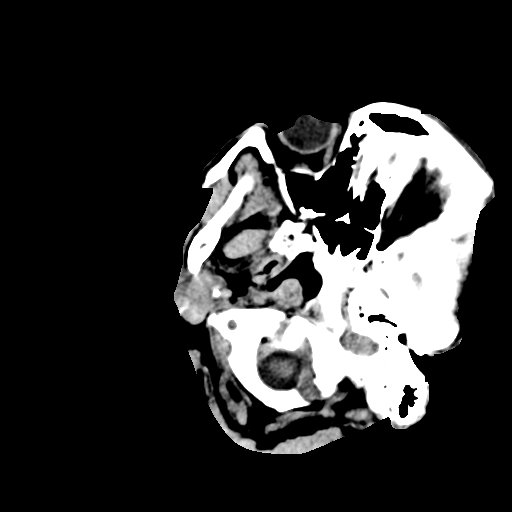
[im 7/36  brain]
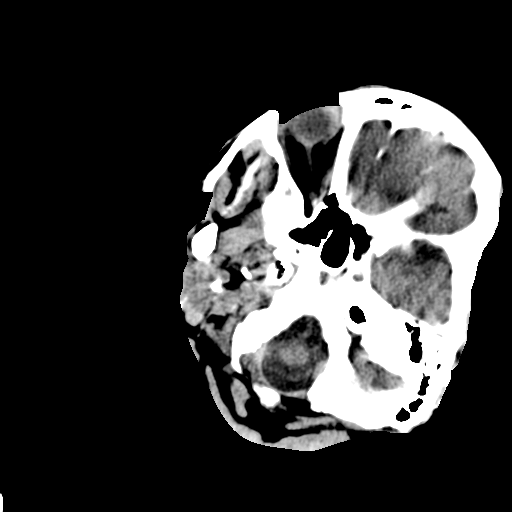
[im 9/36  brain]
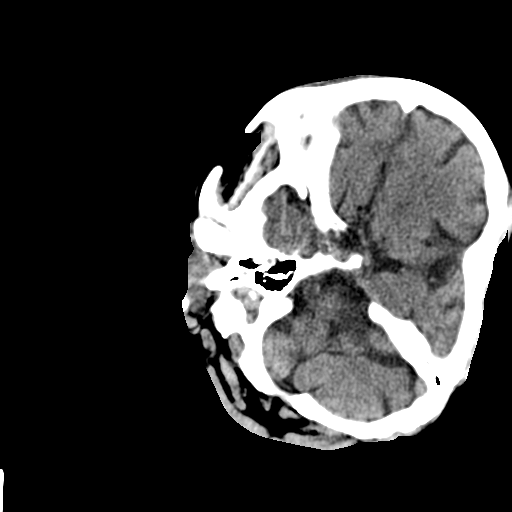
[im 11/36  brain]
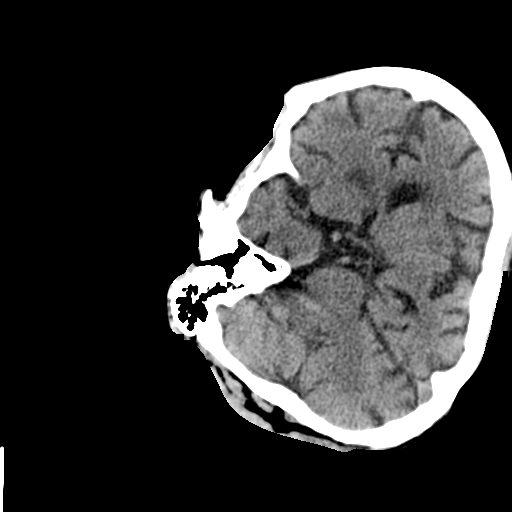
[im 11/36  bone]
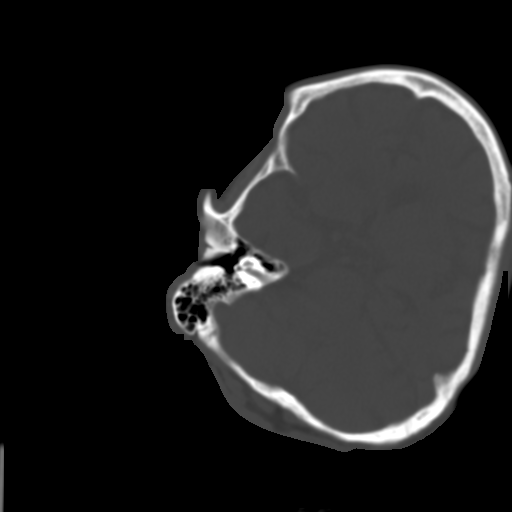
[im 13/36  brain]
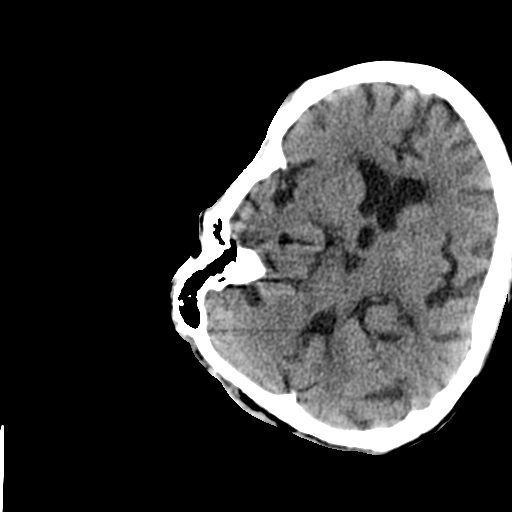
[im 15/36  brain]
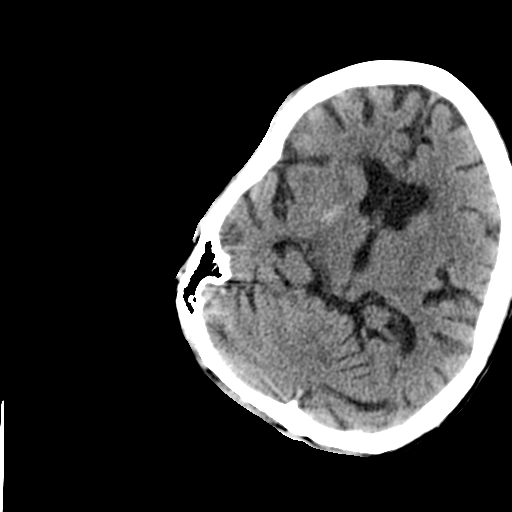
[im 19/36  brain]
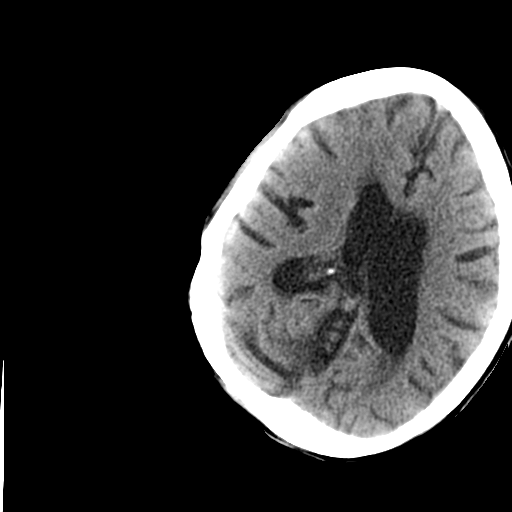
[im 21/36  brain]
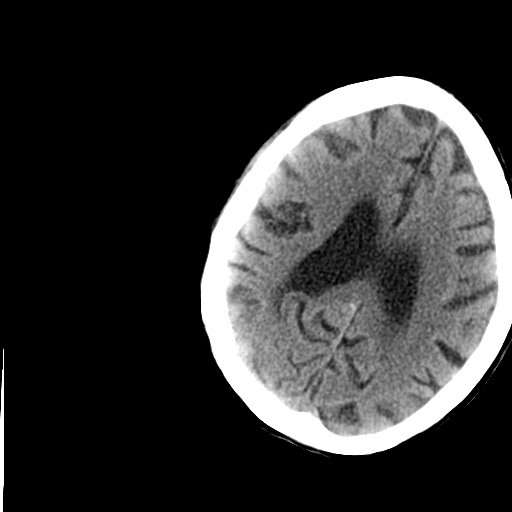
[im 21/36  bone]
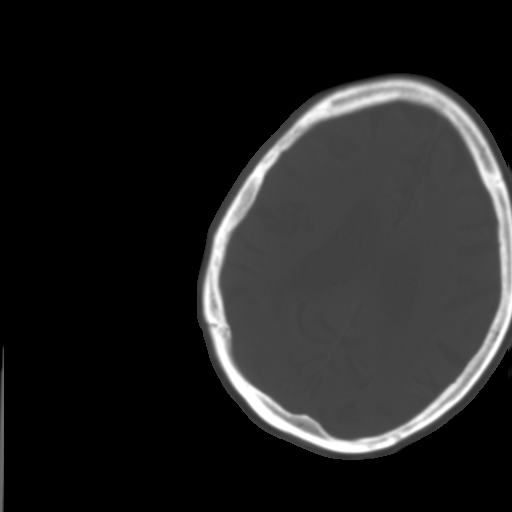
[im 23/36  brain]
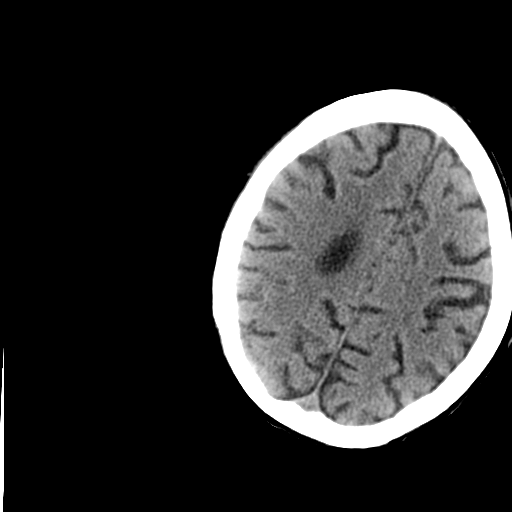
[im 25/36  brain]
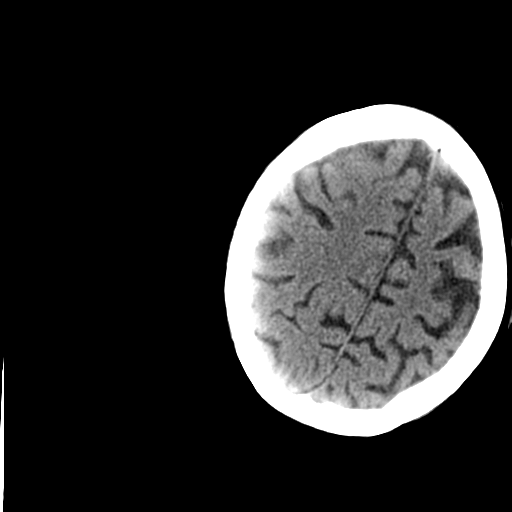
[im 27/36  brain]
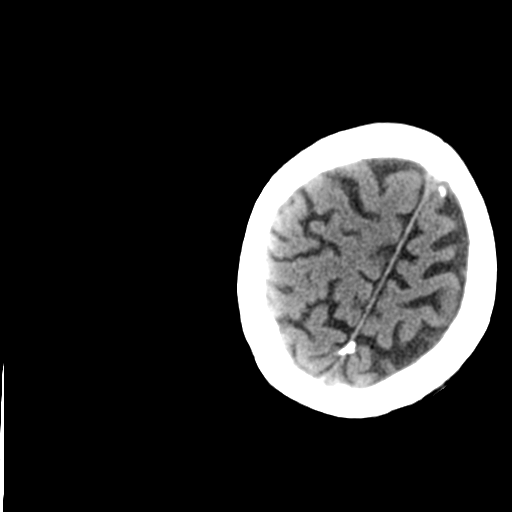
[im 29/36  brain]
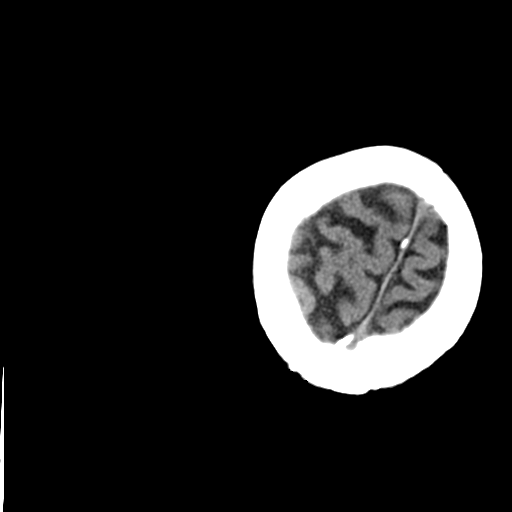
[im 29/36  bone]
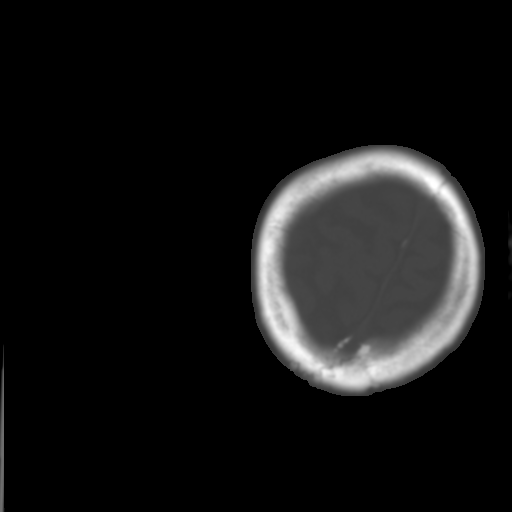
[im 31/36  brain]
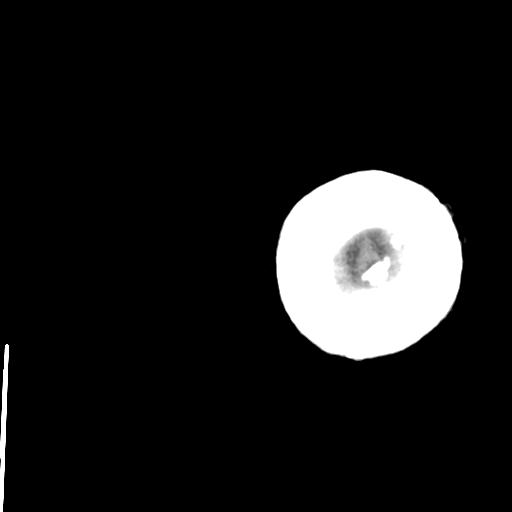
[im 33/36  brain]
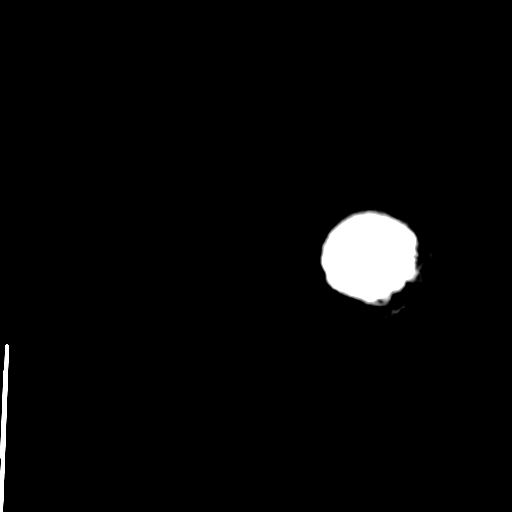

[15 of 30 positions shown; findings below may reference images not displayed]

FINDINGS: There is a small low density fluid level in the visible
left maxillary sinus, probably inflammatory.  Other Visualized
paranasal sinuses and mastoids are clear.  No acute orbit or scalp
soft tissue findings identified.  No scalp hematoma identified. No
acute osseous abnormality identified.

Chronic dystrophic calcifications in the lentiform nuclei.
Significantly age advanced cerebral and cerebellar volume loss
again noted.  No ventriculomegaly. No midline shift, mass effect,
or evidence of mass lesion.  No acute intracranial hemorrhage
identified.  No evidence of cortically based acute infarction
identified.  No suspicious intracranial vascular hyperdensity.
IMPRESSION: 1. No acute intracranial abnormality.  No acute traumatic injury
identified.
2.  Age advanced global atrophy.

## 2014-01-27 IMAGING — CR DG CHEST 1V PORT
1 series · 1 of 1 positions shown · non-contrast
Comparison: 03/05/2012 and 03/03/2012

CLINICAL DATA: Follow up pneumonia

PORTABLE CHEST - 1 VIEW

[AP]
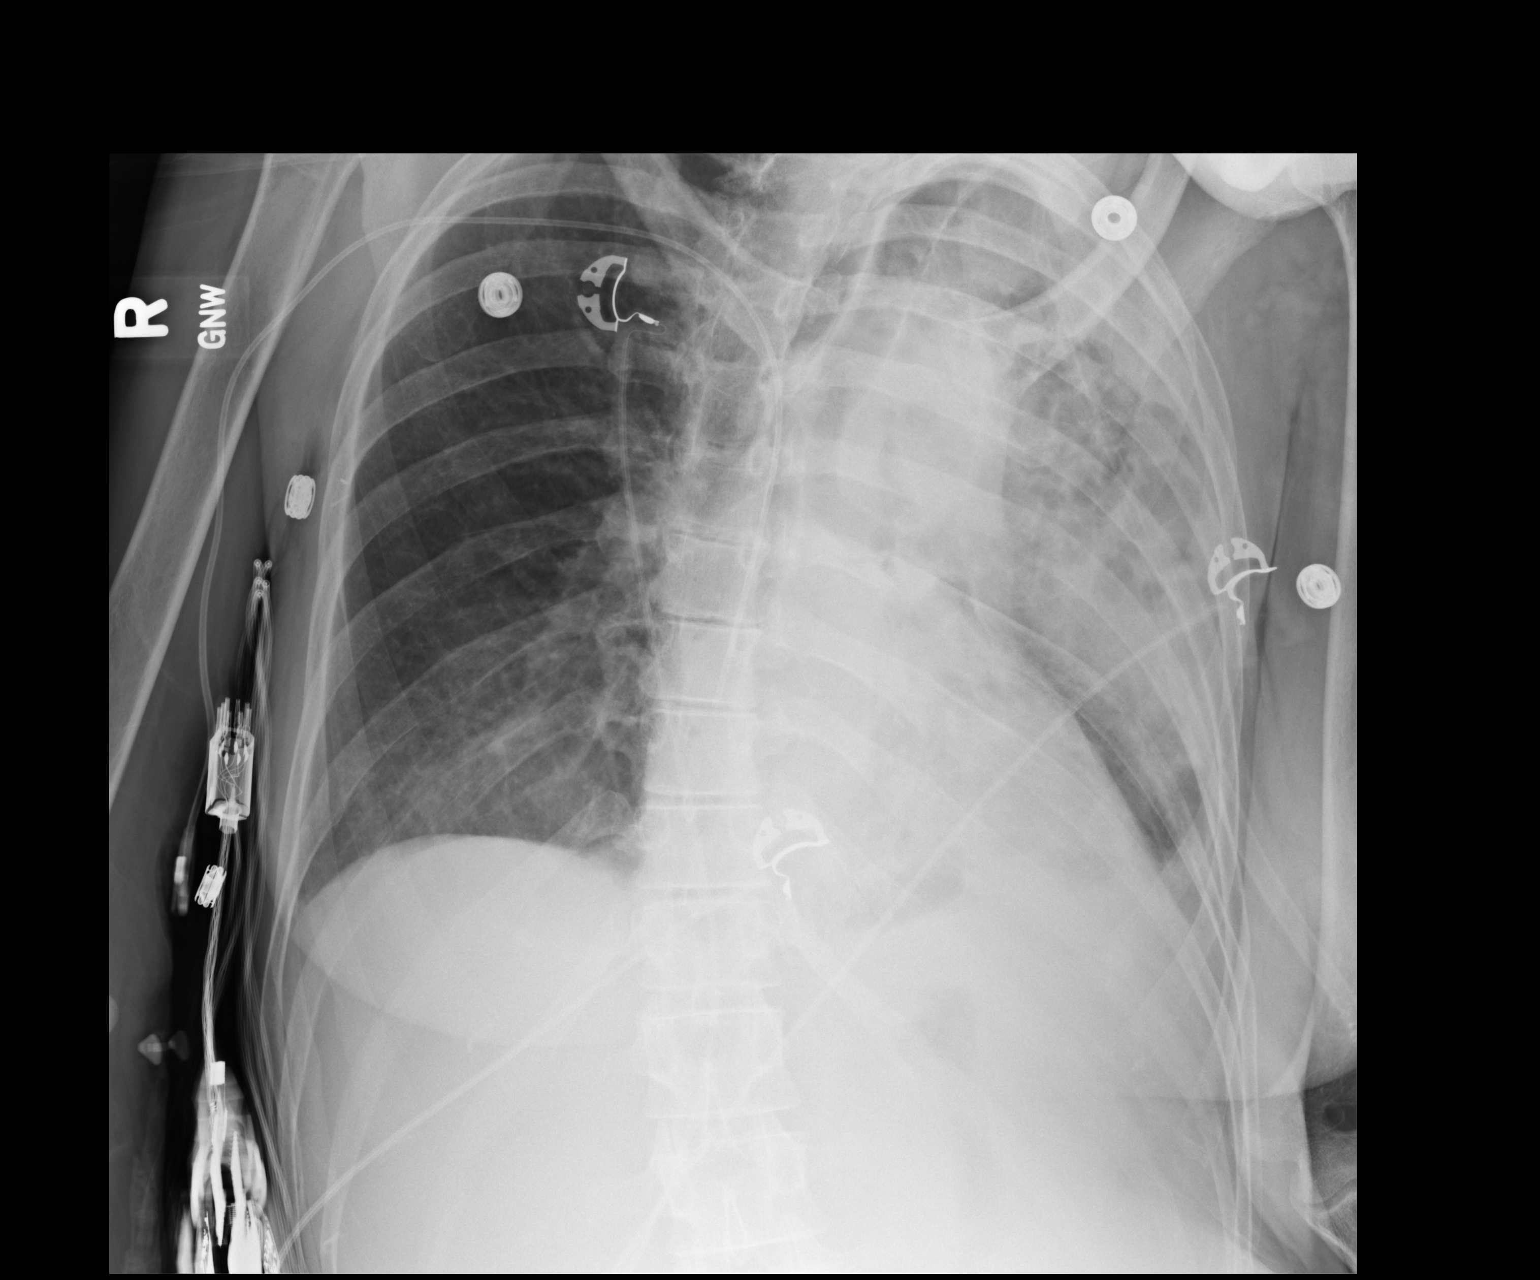

[1 of 1 positions shown; findings below may reference images not displayed]

FINDINGS: Cardiomediastinal silhouette is stable.  Right arm PICC
line with tip in  right atrium. For a distal SVC position PICC line
should be retracted about 1 cm.  Right lung is clear.  Again noted
consolidation air bronchogram and mild cavitation left lung
consistent with multifocal pneumonia.  Probable small bilateral
pleural effusion.
IMPRESSION: Right arm PICC line with tip in  right atrium. For a distal SVC
position PICC line should be retracted about 1 cm.  Right lung is
clear.  Again noted consolidation air bronchogram and mild
cavitation left lung consistent with multifocal pneumonia.
Probable small bilateral pleural effusion.]

## 2014-06-17 ENCOUNTER — Other Ambulatory Visit: Payer: Self-pay | Admitting: Family Medicine

## 2014-06-18 MED ORDER — HYDROCHLOROTHIAZIDE 25 MG PO TABS: 25.0000 mg | ORAL_TABLET | Freq: Every day | ORAL | Status: DC

## 2014-06-18 MED ORDER — LISINOPRIL 40 MG PO TABS: 40.0000 mg | ORAL_TABLET | Freq: Every day | ORAL | Status: DC

## 2014-10-02 ENCOUNTER — Encounter: Payer: Self-pay | Admitting: Family Medicine

## 2014-10-19 ENCOUNTER — Encounter: Payer: Self-pay | Admitting: Family Medicine

## 2014-10-19 ENCOUNTER — Ambulatory Visit (INDEPENDENT_AMBULATORY_CARE_PROVIDER_SITE_OTHER): Payer: Self-pay | Admitting: Family Medicine

## 2014-10-19 VITALS — BP 139/93 | HR 70 | Temp 97.8°F | Ht 67.0 in | Wt 116.0 lb

## 2014-10-19 DIAGNOSIS — I10 Essential (primary) hypertension: Secondary | ICD-10-CM

## 2014-10-19 LAB — BASIC METABOLIC PANEL
BUN: 14 mg/dL (ref 6–23)
CALCIUM: 8.4 mg/dL (ref 8.4–10.5)
CO2: 27 meq/L (ref 19–32)
Chloride: 104 mEq/L (ref 96–112)
Creat: 0.73 mg/dL (ref 0.50–1.10)
GLUCOSE: 71 mg/dL (ref 70–99)
POTASSIUM: 3.4 meq/L — AB (ref 3.5–5.3)
SODIUM: 146 meq/L — AB (ref 135–145)

## 2014-10-19 MED ORDER — LISINOPRIL 40 MG PO TABS: 40.0000 mg | ORAL_TABLET | Freq: Every day | ORAL | Status: DC

## 2014-10-19 MED ORDER — METOPROLOL TARTRATE 50 MG PO TABS: 50.0000 mg | ORAL_TABLET | Freq: Two times a day (BID) | ORAL | Status: DC

## 2014-10-19 MED ORDER — HYDROCHLOROTHIAZIDE 25 MG PO TABS: 25.0000 mg | ORAL_TABLET | Freq: Every day | ORAL | Status: DC

## 2014-10-19 NOTE — Progress Notes (Signed)
   Subjective:    Patient ID: [name], female    DOB: Dec 25, 1960, 54 y.o.   MRN: [id number]  HPI  CHRONIC HTN: Reports missing Metoprolol PM dose, out of Lisinopril 40 and HCTZ 25mg  x 2 months   Reports poor med compliance without good reason. Previously tolerating well, w/o complaints. Lifestyle - daily walking, trying to eat lower sodium foods Denies CP, dyspnea, HA, edema, dizziness / lightheadedness  FOLLOW-UP LAST HOSPITALIZATION: - Today asks many vague questions about her most recent hospitalization. She believed that this was last year at [facility], however no record of this. Stated it was for "walking pneumonia". Chart review with patient showed last hospitalized 02/2012 for aspiration pneumonia and acute toxic encephalopathy with alcohol withdrawal. I reviewed this with her, and she seemed very confused, stating she would check with [facility] on the records.  I have reviewed and updated the following as appropriate: allergies and current medications  Social Hx: - Currently working in [employer] only 1 pint every other day - Former smoker  Review of Systems  See above HPI    Objective:   Physical Exam  BP 139/93 mmHg  Pulse 70  Temp(Src) 97.8 F (36.6 C) (Oral)  Ht 5\' 7"  (1.702 m)  Wt 116 lb (52.617 kg)  BMI 18.16 kg/m2  Gen - chronically ill but currently well-appearing, thin, NAD HEENT - NCAT, PERRL, EOMI, oropharynx clear, MMM Neck - supple, non-tender Heart - RRR, no murmurs heard Lungs - CTAB, no wheezing, crackles, or rhonchi. Non-labored Ext - non-tender, no edema, peripheral pulses intact +2 b/l Skin - warm, dry Neuro - awake, alert, oriented x 3 (name, place, year), grossly non-focal, gait normal     Assessment & Plan:   See specific A&P problem list for details.

## 2014-10-19 NOTE — Patient Instructions (Signed)
Dear [name], Thank you for coming in to clinic today. It was good to see you!  1. For your Blood Pressure - I sent refills on all 3 of your BP meds (doing 90 day supply with refills for 1 whole year) - Start taking Metoprolol in morning and at night - Take Lisinopril 40mg  - 1 pill in morning daily - Take Hydrochlorothiazide 25mg  - 1 pill in morning daily - Look at food labels for lower sodium content  Some important numbers from today's visit: BP - 157/102 Results -  Check basic labs today we will send letter with results.  Please schedule an appointment to get your MAMMOGRAM done - Call [phone]   Please schedule a follow-up appointment with Dr. Parks Ranger in 1 to 3 months for BP re-check  If you have any other questions or concerns, please feel free to call the clinic to contact me. You may also schedule an earlier appointment if necessary.  However, if your symptoms get significantly worse, please go to the Emergency Department to seek immediate medical attention.  Nobie Putnam, [place]

## 2014-10-19 NOTE — Assessment & Plan Note (Addendum)
Remains poorly controlled BP, likely due to multifactorial reasons (poor medical adherence, access to care/meds, poor health literacy, chronic substance use with alcohol) - Out of Lisinopril / HCTZ x 2 months (no provided reason), not taking Metoprolol BID - BP improved on re-check  Plan: 1. Refilled Lisinopril 40mg  and HCTZ 25mg  - each 90 day supply with 3 refills for 1 year supply - (reviewed costs, and this was cheaper option rather than combo pill) 2. Refilled Metoprolol 50mg  PO BID - advised to take BID 3. Encouraged life style changes - reduce EtOH, exercise, reduce salt 4. Check BMET today 5. RTC 1 month BP re-check - if remains elevated and compliance, consider ambulatory BP monitoring vs adding CCB

## 2014-11-30 ENCOUNTER — Encounter: Payer: Self-pay | Admitting: Family Medicine

## 2014-11-30 NOTE — Progress Notes (Signed)
Form placed in PCP box 

## 2014-11-30 NOTE — Progress Notes (Signed)
Patient dropped off form to be filled out for housing.  She needs this completed by Wednesday.  If if won't be done by then please call and let her know so she can tell them.  Her number is [phone].

## 2014-11-30 NOTE — Progress Notes (Signed)
Reviewed patient's chart. Reviewed form received today with requested due date in 48 hours (on Wednesday 12/02/14). Form is a Administrator, arts of Disability" for [employer] (given by [employer] [place], [place]). Patient is currently residing in a housing shelter.  I had called patient to get some clarification on this form, to make sure that I am filling it out appropriately. I reviewed the form with the patient. She states that "we should have all of her records", and she does not have any other information for me. She says that she is "currently fighting for disability" but is unable to clarify what her disability specifically is. She reports this all started when hospitalized at [facility] (seems to be referring to 02/2012 hospitalization) for aspiration pneumonia, AKI, acute toxic metabolic encephalopathy with alcohol withdrawal. She currently admits to back and leg pain, HTN. During our discussion, she repeatedly became upset with me for asking about her disability, stating that "we have her records" and "I need to ask the doctor that admitted her and check with [facility] to get the complete record". Additionally, she states that I need to "call her lawyers, at [employer], for more information."  I advised her that we typically don't complete disability paperwork. However, since this is for housing if she has an existing disability, then we would complete it.  I plan to discuss this form with one of my attendings tomorrow, and will consider contacting her lawyers to get more information on how to appropriately complete this form.  UPDATE - 12/01/14 ---------------  Discussed and reviewed entire Disability Verification Form with attending preceptor Dr. Erin Hearing. Additional thorough chart review of patient. With regards to physical, mental, or emotional disability. The patient would be considered to have a significant mental and emotional disability with diagnosis  of underlying depressive mood disorder in the setting of chronic alcohol dependence, which makes definitive mood disorder diagnosis difficult with substance abuse history. The combination of these two diagnoses would substantially impede her ability to live independently. In addition, the patient does have multiple other chronic co-morbidities requiring medical management such as HTN, HLD, and peripheral neuropathy.  Regarding the completion of the form, see abbreviated responses below and please review scanned copy of completed form for full responses.  1. Yes - The patient has a physical, mental, or emotional impairment that is expected to be of long-continued and indefinite duration, substantially impedes her ability to live independently, and is of a nature that such ability could be improved by more suitable housing conditions.  2. No - Is not a person with a developmental disability, as defined by (manifested before age 88). See form for details  3. Yes - The patient has a chronic mental illness or has a severe and persistent mental or emotional impairment that seriously limits her ability to live independently, and whose impairment could be improved by more suitable housing conditions.  4. No - Is not a person whose sole impairment is alcoholism or drug addiction.  Completed, signed and dated (12/01/14) given to Latina Craver, RN. Patient to be notified that form is ready to pick-up anytime after 12/01/14 at 2:00pm. Additionally, I attempted to call patient but unable to reach her to notify her on this update. As discussed yesterday, after talking with my attending preceptor Dr. Erin Hearing, I did not feel that it was necessary to contact her attorneys at [facility], and was able to complete this form utilizing my knowledge of the patient and  her past medical chart.  Nobie Putnam, [place], PGY-2

## 2014-12-01 ENCOUNTER — Telehealth: Payer: Self-pay | Admitting: Family Medicine

## 2014-12-01 NOTE — Progress Notes (Signed)
Left voice message for pt informing her that Disability form is ready for pick up.  Derl Barrow, RN

## 2014-12-01 NOTE — Telephone Encounter (Signed)
Pt called to check the status of her forms and was told they are ready for pick up. She wanted [place] to fax this to them but doesn't know the number and thinks that this is on the paperwork. I told her we can check but she should try and get the number or come and pick them up. jw

## 2014-12-01 NOTE — Telephone Encounter (Signed)
Spoke with patient sister and informed that forms were ready to be picked up, no fax number available.

## 2015-02-10 ENCOUNTER — Ambulatory Visit: Payer: Self-pay | Admitting: Family Medicine

## 2015-09-24 ENCOUNTER — Emergency Department
Admission: EM | Admit: 2015-09-24 | Discharge: 2015-09-24 | Disposition: A | Discharge status: Home / Self Care | Payer: Self-pay | Attending: Emergency Medicine | Admitting: Emergency Medicine

## 2015-09-24 DIAGNOSIS — Z789 Other specified health status: Secondary | ICD-10-CM

## 2015-09-24 DIAGNOSIS — F1010 Alcohol abuse, uncomplicated: Secondary | ICD-10-CM | POA: Insufficient documentation

## 2015-09-24 DIAGNOSIS — I10 Essential (primary) hypertension: Secondary | ICD-10-CM | POA: Insufficient documentation

## 2015-09-24 DIAGNOSIS — Z87891 Personal history of nicotine dependence: Secondary | ICD-10-CM | POA: Insufficient documentation

## 2015-09-24 LAB — COMPREHENSIVE METABOLIC PANEL
ALT: 30 U/L (ref 14–54)
AST: 42 U/L — ABNORMAL HIGH (ref 15–41)
Albumin: 4 g/dL (ref 3.5–5.0)
Alkaline Phosphatase: 65 U/L (ref 38–126)
Anion gap: 6 (ref 5–15)
BUN: 34 mg/dL — ABNORMAL HIGH (ref 6–20)
CO2: 34 mmol/L — ABNORMAL HIGH (ref 22–32)
CREATININE: 1.03 mg/dL — AB (ref 0.44–1.00)
Calcium: 9.5 mg/dL (ref 8.9–10.3)
Chloride: 101 mmol/L (ref 101–111)
Glucose, Bld: 88 mg/dL (ref 65–99)
Potassium: 3.9 mmol/L (ref 3.5–5.1)
Sodium: 141 mmol/L (ref 135–145)
Total Bilirubin: 1.2 mg/dL (ref 0.3–1.2)
Total Protein: 8.3 g/dL — ABNORMAL HIGH (ref 6.5–8.1)

## 2015-09-24 LAB — CBC
HEMATOCRIT: 39.2 % (ref 35.0–47.0)
Hemoglobin: 13.2 g/dL (ref 12.0–16.0)
MCH: 33.2 pg (ref 26.0–34.0)
MCHC: 33.6 g/dL (ref 32.0–36.0)
MCV: 98.9 fL (ref 80.0–100.0)
Platelets: 176 10*3/uL (ref 150–440)
RBC: 3.97 MIL/uL (ref 3.80–5.20)
RDW: 14.2 % (ref 11.5–14.5)
WBC: 4.6 10*3/uL (ref 3.6–11.0)

## 2015-09-24 LAB — ETHANOL: Alcohol, Ethyl (B): 195 mg/dL — ABNORMAL HIGH (ref <5)

## 2015-09-24 NOTE — ED Provider Notes (Signed)
[facility] Emergency Department Provider Note     Time seen: ----------------------------------------- 2:24 PM on 09/24/2015 -----------------------------------------    I have reviewed the triage vital signs and the nursing notes.   HISTORY  Chief Complaint Medical Clearance    HPI [name] is a 55 y.o. female who presents to ER for detox clearance. Patient does not think she has a problem with alcohol but is going through with detox referral as arranged by her sister. Patient states it's only alcohol no other drugs. States she does have high blood pressure normally, she denies any physical complaints   Past Medical History  Diagnosis Date  . Hypertension   . Alcoholic hepatitis   . Alcoholism   . Breast cancer 2001    Rt side. Had lumpectomy, chemo and XRT by [facility]  . Shortness of breath     Patient Active Problem List   Diagnosis Date Noted  . Cough 06/30/2012  . Heme + stool 03/15/2012  . Anemia 03/15/2012  . Necrotizing pneumonia ([place]) 03/06/2012  . Cholelithiasis 03/01/2012  . Abnormal finding on GI tract imaging 03/01/2012  . Occasional numbness/prickling/tingling of fingers and toes 12/15/2011  . Hypokalemia 12/15/2011  . Hypomagnesemia 08/06/2011  . Unspecified episodic mood disorder 07/24/2011  . Tobacco abuse 11/03/2010  . HYPERTRIGLYCERIDEMIA 01/25/2009  . Alcohol dependence ([place]) 10/11/2006  . NEUROPATHY, PERIPHERAL 10/11/2006  . HYPERTENSION, BENIGN SYSTEMIC 10/11/2006    Past Surgical History  Procedure Laterality Date  . Rt lumpectomy  2001    Allergies Review of patient's allergies indicates no known allergies.  Social History Social History  Substance Use Topics  . Smoking status: Former Smoker -- 0.50 packs/day    Types: Cigarettes    Quit date: 11/02/2000  . Smokeless tobacco: Never Used  . Alcohol Use: No     Comment: stopped one month ago    Review of Systems Constitutional: Negative for  fever. Eyes: Negative for visual changes. ENT: Negative for sore throat. Cardiovascular: Negative for chest pain. Respiratory: Negative for shortness of breath. Gastrointestinal: Negative for abdominal pain, vomiting and diarrhea. Genitourinary: Negative for dysuria. Musculoskeletal: Negative for back pain. Skin: Negative for rash. Neurological: Negative for headaches, focal weakness or numbness.  10-point ROS otherwise negative.  ____________________________________________   PHYSICAL EXAM:  VITAL SIGNS: ED Triage Vitals  Enc Vitals Group     BP 09/24/15 1220 140/94 mmHg     Pulse Rate 09/24/15 1220 72     Resp --      Temp 09/24/15 1220 98.2 F (36.8 C)     Temp Source 09/24/15 1220 Oral     SpO2 09/24/15 1220 97 %     Weight 09/24/15 1220 120 lb (54.432 kg)     Height 09/24/15 1220 5\' 7"  (1.702 m)     Head Cir --      Peak Flow --      Pain Score --      Pain Loc --      Pain Edu? --      Excl. in [place]? --     Constitutional: Alert and oriented. Well appearing and in no distress. Eyes: Conjunctivae are normal. PERRL. Normal extraocular movements. ENT   Head: Normocephalic and atraumatic.   Nose: No congestion/rhinnorhea.   Mouth/Throat: Mucous membranes are moist.   Neck: No stridor. Cardiovascular: Normal rate, regular rhythm. Normal and symmetric distal pulses are present in all extremities. No murmurs, rubs, or gallops. Respiratory: Normal respiratory effort without tachypnea nor retractions.  Breath sounds are clear and equal bilaterally. No wheezes/rales/rhonchi. Gastrointestinal: Soft and nontender. No distention. No abdominal bruits.  Musculoskeletal: Nontender with normal range of motion in all extremities. No joint effusions.  No lower extremity tenderness nor edema. Neurologic:  Normal speech and language. No gross focal neurologic deficits are appreciated. Speech is normal. No gait instability. Skin:  Skin is warm, dry and intact. No rash  noted. Psychiatric: Mood and affect are normal. Speech and behavior are normal. Patient exhibits appropriate insight and judgment. ____________________________________________  ED COURSE:  Pertinent labs & imaging results that were available during my care of the patient were reviewed by me and considered in my medical decision making (see chart for details). Patient is medically clear from my perspective. Labs have been drawn. She is stable for detox referral. ____________________________________________    LABS (pertinent positives/negatives)  Labs Reviewed  COMPREHENSIVE METABOLIC PANEL  ETHANOL  CBC  URINE DRUG SCREEN, QUALITATIVE ([place])   ____________________________________________  FINAL ASSESSMENT AND PLAN  Medical clearance for detox  Plan: Patient with labs as dictated above. Patient appears medically stable, has no complaints.   Earleen Newport, MD   Earleen Newport, MD 09/24/15 [phone]

## 2015-09-24 NOTE — Discharge Instructions (Signed)

## 2015-09-24 NOTE — ED Notes (Signed)
Pt brought here by her sister  Pt states  "I am okay and I don't know what I am doing here."  After retrieving sister from the [place]  Pt reports that she drinks liquor everyday and that she would like detox from alcohol

## 2015-09-24 NOTE — ED Notes (Signed)
Not able to obtain esignature due to no pad in room to write

## 2015-09-29 DIAGNOSIS — F1020 Alcohol dependence, uncomplicated: Secondary | ICD-10-CM | POA: Insufficient documentation

## 2015-10-19 DIAGNOSIS — M25572 Pain in left ankle and joints of left foot: Secondary | ICD-10-CM

## 2015-10-21 NOTE — Congregational Nurse Program (Signed)
Congregational Nurse Program Note  Date of Encounter: 10/19/2015  Past Medical History: Past Medical History  Diagnosis Date  . Hypertension   . Alcoholic hepatitis   . Alcoholism   . Breast cancer 2001    Rt side. Had lumpectomy, chemo and XRT by [facility]  . Shortness of breath     Encounter Details:     CNP Questionnaire - 10/19/15 0940    Patient Demographics   Is this a new or existing patient? New   Patient is considered a/an Not Applicable   Race African-American/Black   Patient Assistance   Location of Patient Assistance Not Applicable   Patient's financial/insurance status Low Income   Uninsured Patient Yes   Interventions Referred to ED/Urgent Care   Patient referred to apply for the following financial assistance [place] insecurities addressed Provided food supplies   Transportation assistance Yes   Type of Assistance Bus Pass Given   Assistance securing medications No   Educational health offerings Acute disease   Encounter Details   Primary purpose of visit Acute Illness/Condition Visit;Navigating the Healthcare System   Was an Emergency Department visit averted? Not Applicable   Does patient have a medical provider? Yes   Patient referred to Urgent Care   Was a mental health screening completed? (GAINS tool) No   Does patient have dental issues? No   Does patient have vision issues? No   Since previous encounter, have you referred patient for abnormal blood pressure that resulted in a new diagnosis or medication change? No   Since previous encounter, have you referred patient for abnormal blood glucose that resulted in a new diagnosis or medication change? No   For Abstraction Use Only   Does patient have insurance? No       Left foot swollen, red and warm to touch.  States difficulty walking due to pain.  Does have a noticeable limp.  Instructed to go to Urgent Care for evaluation.  Bus passes provided

## 2015-11-15 ENCOUNTER — Ambulatory Visit: Payer: Self-pay | Admitting: Family Medicine

## 2015-11-25 DIAGNOSIS — M25572 Pain in left ankle and joints of left foot: Secondary | ICD-10-CM

## 2015-11-30 ENCOUNTER — Ambulatory Visit (INDEPENDENT_AMBULATORY_CARE_PROVIDER_SITE_OTHER): Payer: Self-pay | Admitting: Family Medicine

## 2015-11-30 ENCOUNTER — Encounter: Payer: Self-pay | Admitting: Family Medicine

## 2015-11-30 VITALS — BP 150/88 | HR 71 | Temp 98.9°F | Ht 67.0 in | Wt 115.7 lb

## 2015-11-30 DIAGNOSIS — I10 Essential (primary) hypertension: Secondary | ICD-10-CM

## 2015-11-30 MED ORDER — HYDROCHLOROTHIAZIDE 25 MG PO TABS: 25.0000 mg | ORAL_TABLET | Freq: Every day | ORAL | Status: DC

## 2015-11-30 MED ORDER — LISINOPRIL 40 MG PO TABS: 40.0000 mg | ORAL_TABLET | Freq: Every day | ORAL | Status: DC

## 2015-11-30 MED ORDER — METOPROLOL TARTRATE 50 MG PO TABS: 50.0000 mg | ORAL_TABLET | Freq: Two times a day (BID) | ORAL | Status: DC

## 2015-11-30 NOTE — Patient Instructions (Addendum)
Thank you for coming in to clinic today.  1. Sent refills for all 3 blood pressure medications - In order of importance: #1 Hydrochlorothiazide 25mg  daily #2 Lisinopril 40mg  daily #3 Metoprolol 50mg  twice daily (fill this one last if you cannot afford it)  Recommended to reduce alcohol consumption, as this will raise your blood pressure. Let me know if you would like further information or resources on alcohol cessation.  Try to schedule Mammogram for breast cancer screening.  Please schedule a follow-up appointment with 3 month follow-up Blood Pressure  If you have any other questions or concerns, please feel free to call the clinic to contact me. You may also schedule an earlier appointment if necessary.  However, if your symptoms get significantly worse, please go to the Emergency Department to seek immediate medical attention.  Nobie Putnam, [place]

## 2015-11-30 NOTE — Progress Notes (Signed)
Subjective:    Patient ID: [name], female    DOB: Jul 22, 1961, 55 y.o.   MRN: [id number]  [name] is a 55 y.o. female presenting on 11/30/2015 for Medication Refill   HPI   CHRONIC HTN: Reports - ran out of medications 1 month ago, presents for refill. Has checked BP outside of office before at drug store, no recordings. Current Meds - Lisinopril 40mg , HCTZ 25mg , Metoprolol 50mg  BID   Previously tolerating well, w/o complaints. Lifestyle - walking for exercise, not daily Admits continues to consume alcohol with history of alcohol dependence, has not followed up with detox program as anticipated in 09/2015 Denies CP, dyspnea, HA, edema, dizziness / lightheadedness   Social History  Substance Use Topics  . Smoking status: Former Smoker -- 0.50 packs/day    Types: Cigarettes    Quit date: 11/02/2000  . Smokeless tobacco: Never Used  . Alcohol Use: 25.2 oz/week    42 Shots of liquor per week     Comment: Continues to drink    Review of Systems Per HPI unless specifically indicated above     Objective:    BP 150/88 mmHg  Pulse 71  Temp(Src) 98.9 F (37.2 C) (Oral)  Ht 5\' 7"  (1.702 m)  Wt 115 lb 11.2 oz (52.481 kg)  BMI 18.12 kg/m2  Wt Readings from Last 3 Encounters:  11/30/15 115 lb 11.2 oz (52.481 kg)  09/24/15 120 lb (54.432 kg)  10/19/14 116 lb (52.617 kg)    Physical Exam  Constitutional: She appears well-developed and well-nourished. No distress.  Thin but well appearing, comfortable, cooperative  HENT:  Head: Normocephalic and atraumatic.  Mouth/Throat: Oropharynx is clear and moist.  Eyes: EOM are normal. Pupils are equal, round, and reactive to light.  Neck: Normal range of motion. Neck supple. No thyromegaly present.  Cardiovascular: Normal rate, regular rhythm, normal heart sounds and intact distal pulses.   No murmur heard. Pulmonary/Chest: Effort normal.  Musculoskeletal: She exhibits no edema.  Neurological: She is alert.  Skin:  Skin is warm and dry. She is not diaphoretic.  Psychiatric: She has a normal mood and affect. Her behavior is normal.  Nursing note and vitals reviewed.      Assessment & Plan:   Problem List Items Addressed This Visit    HYPERTENSION, BENIGN SYSTEMIC - Primary    Poorly controlled BP secondary to poor medical adherence (often due to cost) and chronic substance use with alcohol. Out of all anti-HTN meds >1 month  Plan: 1. Refilled Lisinopril 40mg  and HCTZ 25mg  - each 90 day supply with 3 refills for 1 year supply 2. Refilled Metoprolol 50mg  PO BID - advised to take BID (should fill other 2 rx first if cannot afford all 3) 3. Encouraged life style changes - reduce EtOH, exercise, reduce salt 4. RTC 1 month nurse BP re-check, otherwise within 3 months for follow-up, sooner if return criteria or elevated BP outside clinic, future consider CCB        Relevant Medications   metoprolol (LOPRESSOR) 50 MG tablet   lisinopril (PRINIVIL,ZESTRIL) 40 MG tablet   hydrochlorothiazide (HYDRODIURIL) 25 MG tablet      Meds ordered this encounter  Medications  . metoprolol (LOPRESSOR) 50 MG tablet    Sig: Take 1 tablet (50 mg total) by mouth 2 (two) times daily.    Dispense:  180 tablet    Refill:  3  . lisinopril (PRINIVIL,ZESTRIL) 40 MG tablet    Sig: Take 1 tablet (  40 mg total) by mouth daily.    Dispense:  90 tablet    Refill:  3  . hydrochlorothiazide (HYDRODIURIL) 25 MG tablet    Sig: Take 1 tablet (25 mg total) by mouth daily.    Dispense:  90 tablet    Refill:  3      Follow up plan: Return in about 3 months (around 02/29/2016) for blood pressure.  Nobie Putnam, [place], PGY-3

## 2015-11-30 NOTE — Assessment & Plan Note (Signed)
Poorly controlled BP secondary to poor medical adherence (often due to cost) and chronic substance use with alcohol. Out of all anti-HTN meds >1 month  Plan: 1. Refilled Lisinopril 40mg  and HCTZ 25mg  - each 90 day supply with 3 refills for 1 year supply 2. Refilled Metoprolol 50mg  PO BID - advised to take BID (should fill other 2 rx first if cannot afford all 3) 3. Encouraged life style changes - reduce EtOH, exercise, reduce salt 4. RTC 1 month nurse BP re-check, otherwise within 3 months for follow-up, sooner if return criteria or elevated BP outside clinic, future consider CCB

## 2015-12-02 NOTE — Congregational Nurse Program (Signed)
Congregational Nurse Program Note  Date of Encounter: 11/25/2015  Past Medical History: Past Medical History  Diagnosis Date  . Hypertension   . Alcoholic hepatitis   . Alcoholism ([place])   . Breast cancer ([place]) 2001    Rt side. Had lumpectomy, chemo and XRT by [facility]  . Shortness of breath     Encounter Details:     CNP Questionnaire - 11/25/15 1515    Patient Demographics   Is this a new or existing patient? Existing   Patient is considered a/an Not Applicable   Race African-American/Black   Patient Assistance   Location of Patient Assistance Not Applicable   Patient's financial/insurance status Low Income   Uninsured Patient Yes   Interventions Counseled to make appt. with provider   Patient referred to apply for the following financial assistance [employer] insecurities addressed Provided food supplies   Transportation assistance No   Assistance securing medications No   Educational health offerings Acute disease   Encounter Details   Primary purpose of visit Acute Illness/Condition Visit;Navigating the Healthcare System   Was an Emergency Department visit averted? Not Applicable   Does patient have a medical provider? Yes   Patient referred to Establish PCP   Was a mental health screening completed? (GAINS tool) No   Does patient have dental issues? No   Does patient have vision issues? No   Since previous encounter, have you referred patient for abnormal blood pressure that resulted in a new diagnosis or medication change? No   Since previous encounter, have you referred patient for abnormal blood glucose that resulted in a new diagnosis or medication change? No   For Abstraction Use Only   Does patient have insurance? No         Amb Nursing Assessment - 11/30/15 1338    Pre-visit preparation   Pre-visit preparation completed Yes   Abuse/Neglect Assessment   Do you feel unsafe in your current relationship? No   Do you feel  physically threatened by others? No   Anyone hurting you at home, work, or school? No   Unable to ask? No      Stated did not go to the urgent care on 10/19/15.  Requested bus passes to go now.  Discussed with her that if she did not need to go on 10/19/15, probably her foot does not need to be x-rayed.  Client states she is better, no pain in foot.  No noted swelling.  Encouraged her to follow through with PHP.

## 2016-01-14 ENCOUNTER — Other Ambulatory Visit: Payer: Self-pay | Admitting: Family Medicine

## 2016-01-14 DIAGNOSIS — I10 Essential (primary) hypertension: Secondary | ICD-10-CM

## 2016-01-23 ENCOUNTER — Emergency Department (HOSPITAL_COMMUNITY): Payer: Self-pay

## 2016-01-23 ENCOUNTER — Encounter (HOSPITAL_COMMUNITY): Payer: Self-pay

## 2016-01-23 ENCOUNTER — Emergency Department (HOSPITAL_COMMUNITY)
Admission: EM | Admit: 2016-01-23 | Discharge: 2016-01-23 | Disposition: A | Discharge status: Home / Self Care | Payer: Self-pay | Attending: Emergency Medicine | Admitting: Emergency Medicine

## 2016-01-23 DIAGNOSIS — M25562 Pain in left knee: Secondary | ICD-10-CM

## 2016-01-23 DIAGNOSIS — M109 Gout, unspecified: Secondary | ICD-10-CM

## 2016-01-23 DIAGNOSIS — Z87891 Personal history of nicotine dependence: Secondary | ICD-10-CM | POA: Insufficient documentation

## 2016-01-23 DIAGNOSIS — Z853 Personal history of malignant neoplasm of breast: Secondary | ICD-10-CM | POA: Insufficient documentation

## 2016-01-23 DIAGNOSIS — I10 Essential (primary) hypertension: Secondary | ICD-10-CM | POA: Insufficient documentation

## 2016-01-23 DIAGNOSIS — Z791 Long term (current) use of non-steroidal anti-inflammatories (NSAID): Secondary | ICD-10-CM | POA: Insufficient documentation

## 2016-01-23 LAB — CBC WITH DIFFERENTIAL/PLATELET
Basophils Absolute: 0 10*3/uL (ref 0.0–0.1)
Basophils Relative: 0 %
Eosinophils Absolute: 0 10*3/uL (ref 0.0–0.7)
Eosinophils Relative: 0 %
HEMATOCRIT: 32.8 % — AB (ref 36.0–46.0)
Hemoglobin: 11.2 g/dL — ABNORMAL LOW (ref 12.0–15.0)
LYMPHS ABS: 1.2 10*3/uL (ref 0.7–4.0)
LYMPHS PCT: 11 %
MCH: 32.3 pg (ref 26.0–34.0)
MCHC: 34.1 g/dL (ref 30.0–36.0)
MCV: 94.5 fL (ref 78.0–100.0)
MONO ABS: 0.9 10*3/uL (ref 0.1–1.0)
MONOS PCT: 8 %
NEUTROS ABS: 9.3 10*3/uL — AB (ref 1.7–7.7)
Neutrophils Relative %: 81 %
Platelets: 162 10*3/uL (ref 150–400)
RBC: 3.47 MIL/uL — ABNORMAL LOW (ref 3.87–5.11)
RDW: 12.5 % (ref 11.5–15.5)
WBC: 11.5 10*3/uL — ABNORMAL HIGH (ref 4.0–10.5)

## 2016-01-23 LAB — GRAM STAIN: SPECIAL REQUESTS: NORMAL

## 2016-01-23 LAB — SYNOVIAL CELL COUNT + DIFF, W/ CRYSTALS
LYMPHOCYTES-SYNOVIAL FLD: 11 % (ref 0–20)
NEUTROPHIL, SYNOVIAL: 89 % — AB (ref 0–25)
WBC, Synovial: 72160 /mm3 — ABNORMAL HIGH (ref 0–200)

## 2016-01-23 LAB — COMPREHENSIVE METABOLIC PANEL
ALT: 14 U/L (ref 14–54)
ANION GAP: 13 (ref 5–15)
AST: 23 U/L (ref 15–41)
Albumin: 3.3 g/dL — ABNORMAL LOW (ref 3.5–5.0)
Alkaline Phosphatase: 58 U/L (ref 38–126)
BILIRUBIN TOTAL: 1.9 mg/dL — AB (ref 0.3–1.2)
BUN: 19 mg/dL (ref 6–20)
CHLORIDE: 96 mmol/L — AB (ref 101–111)
CO2: 26 mmol/L (ref 22–32)
Calcium: 9.2 mg/dL (ref 8.9–10.3)
Creatinine, Ser: 1.25 mg/dL — ABNORMAL HIGH (ref 0.44–1.00)
GFR, EST AFRICAN AMERICAN: 56 mL/min — AB (ref >60)
GFR, EST NON AFRICAN AMERICAN: 49 mL/min — AB (ref >60)
Glucose, Bld: 105 mg/dL — ABNORMAL HIGH (ref 65–99)
POTASSIUM: 2.9 mmol/L — AB (ref 3.5–5.1)
Sodium: 135 mmol/L (ref 135–145)
Total Protein: 7.8 g/dL (ref 6.5–8.1)

## 2016-01-23 LAB — SEDIMENTATION RATE: Sed Rate: 116 mm/hr — ABNORMAL HIGH (ref 0–22)

## 2016-01-23 LAB — I-STAT CG4 LACTIC ACID, ED: LACTIC ACID, VENOUS: 0.95 mmol/L (ref 0.5–2.0)

## 2016-01-23 LAB — C-REACTIVE PROTEIN: CRP: 18.6 mg/dL — ABNORMAL HIGH (ref <1.0)

## 2016-01-23 MED ORDER — ALLOPURINOL 100 MG PO TABS: 100.0000 mg | ORAL_TABLET | Freq: Two times a day (BID) | ORAL | Status: DC

## 2016-01-23 MED ORDER — COLCHICINE 0.6 MG PO TABS: 0.6000 mg | ORAL_TABLET | Freq: Two times a day (BID) | ORAL | Status: DC

## 2016-01-23 MED ORDER — POTASSIUM CHLORIDE CRYS ER 20 MEQ PO TBCR
40.0000 meq | EXTENDED_RELEASE_TABLET | Freq: Once | ORAL | Status: AC
  Administered 2016-01-23: 40 meq via ORAL
  Filled 2016-01-23: qty 2

## 2016-01-23 MED ORDER — PREDNISONE 10 MG PO TABS: 20.0000 mg | ORAL_TABLET | Freq: Every day | ORAL | Status: DC

## 2016-01-23 MED ORDER — KETOROLAC TROMETHAMINE 60 MG/2ML IM SOLN
60.0000 mg | Freq: Once | INTRAMUSCULAR | Status: AC
  Administered 2016-01-23: 60 mg via INTRAMUSCULAR
  Filled 2016-01-23: qty 2

## 2016-01-23 MED ORDER — PREDNISONE 20 MG PO TABS
20.0000 mg | ORAL_TABLET | Freq: Once | ORAL | Status: AC
  Administered 2016-01-23: 20 mg via ORAL
  Filled 2016-01-23: qty 1

## 2016-01-23 MED ORDER — COLCHICINE 0.6 MG PO TABS
0.6000 mg | ORAL_TABLET | Freq: Once | ORAL | Status: AC
  Administered 2016-01-23: 0.6 mg via ORAL
  Filled 2016-01-23: qty 1

## 2016-01-23 MED ORDER — NAPROXEN 500 MG PO TABS: 500.0000 mg | ORAL_TABLET | Freq: Two times a day (BID) | ORAL | Status: DC

## 2016-01-23 MED ORDER — LIDOCAINE HCL (PF) 1 % IJ SOLN
30.0000 mL | Freq: Once | INTRAMUSCULAR | Status: AC
  Administered 2016-01-23: 30 mL
  Filled 2016-01-23: qty 30

## 2016-01-23 MED ORDER — OXYCODONE-ACETAMINOPHEN 5-325 MG PO TABS: 1.0000 | ORAL_TABLET | ORAL | Status: DC | PRN

## 2016-01-23 NOTE — ED Notes (Signed)
Patient complains of bilateral leg pain from knees down for 4 days, denies trauma. Pain worse with ambulation

## 2016-01-23 NOTE — Discharge Instructions (Signed)
You have been seen today for knee pain. Her lab tests showed evidence of inflammation and possible gout. Some lab tests are still pending. Any abnormal results will be called directly to you. Follow-up with orthopedics. Call the number provided around 8:30 AM tomorrow morning to set up an appointment to see Dr. Sharol Given later in the day on Monday, June 12. Take the colchicine, allopurinol, and prednisone daily, as prescribed. Use the naproxen for pain. Percocet for severe pain. Do not drive or do other dangerous activities while using the Percocet. Follow up with PCP as needed. Return to ED should symptoms worsen.

## 2016-01-23 NOTE — ED Notes (Signed)
Declined W/C at D/C and was escorted to lobby by RN. 

## 2016-01-23 NOTE — ED Provider Notes (Signed)
CSN: [id number]     Arrival date & time 01/23/16  1105 History  By signing my name below, I, [name], attest that this documentation has been prepared under the direction and in the presence of Shawn Joy, PA-C.  Electronically Signed: Julien Nordmann, ED Scribe. 01/23/2016. 12:48 PM.    Chief Complaint  Patient presents with  . bilateral leg pain      The history is provided by the patient. No language interpreter was used.   HPI Comments: [name] is a 55 y.o. female who has a PMHx of HTN, alcoholism, right sided breast cancer presents to the Emergency Department complaining of constant, gradual worsening, moderate, sore, pain that started about four days ago. She notes associated mild swelling and warmth to her left knee. Pt says that her left leg started to have pain before her right. 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 Pt notes her pain radiates from her knees down and causes them to tense and lock up. She endorses difficulty walking and straightening her left leg. She states she has the same pain in the past but it went away shortly. She denies injuries or falls to her legs, neuro deficits, or any other complaints she further denies hx of HIV, hx of IV drug use, history of DVT or PE, or history of gout. She has no known drug allergies.  Past Medical History  Diagnosis Date  . Hypertension   . Alcoholic hepatitis   . Alcoholism ([place])   . Breast cancer ([place]) 2001    Rt side. Had lumpectomy, chemo and XRT by [facility]  . Shortness of breath    Past Surgical History  Procedure Laterality Date  . Rt lumpectomy  2001   No family history on file. Social History  Substance Use Topics  . Smoking status: Former Smoker -- 0.50 packs/day    Types: Cigarettes    Quit date: 11/02/2000  . Smokeless tobacco: Never Used  . Alcohol Use: 25.2 oz/week    42 Shots of liquor per week     Comment: Continues to drink   OB History     No data available     Review of Systems  Constitutional: Positive for fever.  Musculoskeletal: Positive for arthralgias.  Neurological: Negative for weakness and numbness.  All other systems reviewed and are negative.     Allergies  Review of patient's allergies indicates no known allergies.  Home Medications   Prior to Admission medications   Medication Sig Start Date End Date Taking? Authorizing Provider  allopurinol (ZYLOPRIM) 100 MG tablet Take 1 tablet (100 mg total) by mouth 2 (two) times daily. 01/23/16   Shawn C Joy, PA-C  colchicine 0.6 MG tablet Take 1 tablet (0.6 mg total) by mouth 2 (two) times daily. 01/23/16   Shawn C Joy, PA-C  folic acid (FOLVITE) 1 MG tablet Take 1 mg by mouth daily.    Historical Provider, MD  hydrochlorothiazide (HYDRODIURIL) 25 MG tablet TAKE ONE TABLET BY MOUTH ONCE DAILY 01/14/16   Olin Hauser, DO  lisinopril (PRINIVIL,ZESTRIL) 40 MG tablet TAKE ONE TABLET BY MOUTH ONCE DAILY 01/14/16   Olin Hauser, DO  metoprolol (LOPRESSOR) 50 MG tablet TAKE ONE TABLET BY MOUTH TWICE DAILY 01/14/16   Olin Hauser, DO  Multiple Vitamin (MULTIVITAMIN) tablet Take 1 tablet by mouth daily.    Historical Provider, MD  naproxen (NAPROSYN) 500 MG tablet Take 1 tablet (500 mg  total) by mouth 2 (two) times daily. 01/23/16   Shawn C Joy, PA-C  oxyCODONE-acetaminophen (PERCOCET/ROXICET) 5-325 MG tablet Take 1 tablet by mouth every 4 (four) hours as needed for severe pain. 01/23/16   Shawn C Joy, PA-C  predniSONE (DELTASONE) 10 MG tablet Take 2 tablets (20 mg total) by mouth daily. 01/23/16   Shawn C Joy, PA-C  thiamine 100 MG tablet Take 100 mg by mouth daily.    Historical Provider, MD   Triage vitals: BP 144/97 mmHg  Pulse 86  Temp(Src) 100.2 F (37.9 C) (Oral)  Resp 18  SpO2 100% Physical Exam  Constitutional: She appears well-developed and well-nourished. No distress.  HENT:  Head: Normocephalic and atraumatic.  Eyes: Conjunctivae are  normal.  Neck: Neck supple.  Cardiovascular: Normal rate, regular rhythm, normal heart sounds and intact distal pulses.   Pulmonary/Chest: Effort normal and breath sounds normal. No respiratory distress.  Abdominal: She exhibits no distension. There is no guarding.  Musculoskeletal: She exhibits edema and tenderness.  Tenderness, swelling, erythema, and increased warmth to the left knee. Right knee unremarkable. ROM intact but painful, particularly on the left.  Lymphadenopathy:    She has no cervical adenopathy.  Neurological: She is alert. She has normal reflexes.  No sensory deficits Strength 5/5  Skin: Skin is warm and dry. She is not diaphoretic.  Psychiatric: She has a normal mood and affect. Her behavior is normal.  Nursing note and vitals reviewed.   ED Course  .Joint Aspiration/Arthrocentesis Date/Time: 01/23/2016 3:02 PM Performed by: Lorayne Bender Authorized by: Lorayne Bender Consent: Verbal consent obtained. Risks and benefits: risks, benefits and alternatives were discussed Consent given by: patient Patient understanding: patient states understanding of the procedure being performed Patient consent: the patient's understanding of the procedure matches consent given Procedure consent: procedure consent matches procedure scheduled Patient identity confirmed: verbally with patient and arm band Time out: Immediately prior to procedure a "time out" was called to verify the correct patient, procedure, equipment, support staff and site/side marked as required. Indications: joint swelling,  possible septic joint and pain  Body area: knee Joint: left knee Local anesthesia used: yes Anesthesia: local infiltration Local anesthetic: lidocaine 1% without epinephrine Anesthetic total: 5 ml Patient sedated: no Preparation: Patient was prepped and draped in the usual sterile fashion. Needle gauge: 18 G Ultrasound guidance: no Approach: medial Aspirate: blood-tinged,  yellow and  cloudy Aspirate amount: 40 mL Lidocaine 1% amount: 5 ml Patient tolerance: Patient tolerated the procedure well with no immediate complications    DIAGNOSTIC STUDIES: Oxygen Saturation is 100% on RA, normal by my interpretation.  COORDINATION OF CARE:  12:43 PM Will order x-ray of both knees. Discussed treatment plan with pt at bedside and pt agreed to plan.  Labs Review Labs Reviewed  PROTEIN, SYNOVIAL FLUID - Abnormal; Notable for the following:    Protein, Synovial Fluid 4.5 (*)    All other components within normal limits  SYNOVIAL CELL COUNT + DIFF, W/ CRYSTALS - Abnormal; Notable for the following:    Color, Synovial BROWN (*)    Appearance-Synovial TURBID (*)    WBC, Synovial 72160 (*)    Neutrophil, Synovial 89 (*)    All other components within normal limits  URIC ACID, SYNOVIAL FLUID - Abnormal; Notable for the following:    Uric Acid, Synovial Fluid 9.5 (*)    All other components within normal limits  COMPREHENSIVE METABOLIC PANEL - Abnormal; Notable for the following:    Potassium 2.9 (*)  Chloride 96 (*)    Glucose, Bld 105 (*)    Creatinine, Ser 1.25 (*)    Albumin 3.3 (*)    Total Bilirubin 1.9 (*)    GFR calc non Af Amer 49 (*)    GFR calc Af Amer 56 (*)    All other components within normal limits  CBC WITH DIFFERENTIAL/PLATELET - Abnormal; Notable for the following:    WBC 11.5 (*)    RBC 3.47 (*)    Hemoglobin 11.2 (*)    HCT 32.8 (*)    Neutro Abs 9.3 (*)    All other components within normal limits  C-REACTIVE PROTEIN - Abnormal; Notable for the following:    CRP 18.6 (*)    All other components within normal limits  SEDIMENTATION RATE - Abnormal; Notable for the following:    Sed Rate 116 (*)    All other components within normal limits  GRAM STAIN  CULTURE, BODY FLUID-BOTTLE  CULTURE, BLOOD (ROUTINE X 2)  CULTURE, BLOOD (ROUTINE X 2)  GLUCOSE, SYNOVIAL FLUID  I-STAT CG4 LACTIC ACID, ED    Imaging Review Dg Knee Complete 4 Views  Left  01/23/2016  CLINICAL DATA:  Bilateral knee pain for 4 days. EXAM: LEFT KNEE - COMPLETE 4+ VIEW COMPARISON:  None. FINDINGS: There is a suprapatellar joint effusion. No fracture, dislocation, or significant degenerative change. IMPRESSION: Moderate suprapatellar effusion. Electronically Signed   By: Dorise Bullion III M.D   On: 01/23/2016 13:49   Dg Knee Complete 4 Views Right  01/23/2016  CLINICAL DATA:  Bilateral knee pain for 4 days, pain, swelling, redness EXAM: RIGHT KNEE - COMPLETE 4+ VIEW COMPARISON:  None. FINDINGS: Four views of the right knee submitted. No acute fracture or subluxation. There is mild narrowing of medial joint compartment. Minimal narrowing of patellofemoral joint space. No joint effusion. IMPRESSION: No acute fracture or subluxation.  Minimal degenerative changes. Electronically Signed   By: Lahoma Crocker M.D.   On: 01/23/2016 13:49   I have personally reviewed and evaluated these images and lab results as part of my medical decision-making.   EKG Interpretation None       Medications  ketorolac (TORADOL) injection 60 mg (60 mg Intramuscular Given 01/23/16 1253)  lidocaine (PF) (XYLOCAINE) 1 % injection 30 mL (30 mLs Other Given 01/23/16 1438)  potassium chloride SA (K-DUR,KLOR-CON) CR tablet 40 mEq (40 mEq Oral Given 01/23/16 1642)  colchicine tablet 0.6 mg (0.6 mg Oral Given 01/23/16 1643)  predniSONE (DELTASONE) tablet 20 mg (20 mg Oral Given 01/23/16 1643)   Orders Placed This Encounter  Procedures  . Arthrocentesis  . Gram stain  . Culture, body fluid-bottle  . DG Knee Complete 4 Views Left  . DG Knee Complete 4 Views Right  . Synovial cell count + diff, w/ crystals  . Comprehensive metabolic panel  . CBC with Differential  . C-reactive protein  . Sedimentation rate  . Arthrocentesis equipment to bedside Supplies: Chlorhexidine swabs, Sterile 50cc lure lock syringe - for  fluid collection, 5cc syringes, Sterile red syringe caps, Two - 18 gauge needles, 25  gauge needles, Sterile gloves  . Apply ace wrap  . I-Stat CG4 Lactic Acid, ED    MDM   Final diagnoses:  Knee pain, acute, left  Acute gout of left knee, unspecified cause    [name] presents with knee pain that began suddenly 4 days ago.  Findings and plan of care discussed with Veryl Speak, MD. Dr. Stark Jock personally evaluated and  examined this patient.   The fever, sudden onset of the patient's symptoms, physical exam findings, and effusion without trauma or degenerative changes give indication for arthrocentesis to rule out septic joint. Synovial fluid returned with over 72,000 WBCs with an increase in PMNs. No organisms visualized. Some urate crystals present. Orthopedic consult is indicated based on cell count over 50,000. 4:32 PM Spoke with Dr. Sharol Given, Orthopedic surgeon on call, who advises that he suspects gout over septic arthritis.Recommends 0.6 mg colchicine twice a day, 100 mg of allopurinol twice a day, 10-20 mg of prednisone daily, and states he will follow up with the patient in his office tomorrow.  This information was communicated with the patient, who agreed to the plan. Patient is nontoxic appearing, not tachycardic, not tachypneic, not hypotensive, and is in no apparent distress. Patient has no signs of sepsis or other serious or life-threatening condition upon discharge. Patient to follow-up with a PCP on the other lab abnormalities. Home care and return precautions discussed. Patient voiced understanding of all instructions, accepts the plan, and is comfortable with discharge.  Filed Vitals:   01/23/16 1117 01/23/16 1348 01/23/16 1646  BP: 144/97 129/88 140/87  Pulse: 86 79 65  Temp: 100.2 F (37.9 C) 98.5 F (36.9 C) 97.5 F (36.4 C)  TempSrc: Oral Oral Oral  Resp: 18 16 18   SpO2: 100% 100% 100%    I personally performed the services described in this documentation, which was scribed in my presence. The recorded information has been reviewed and is  accurate.   Lorayne Bender, PA-C 01/25/16 0602  Veryl Speak, MD 01/27/16 [phone]

## 2016-01-24 LAB — URIC ACID, SYNOVIAL FLUID: URIC ACID, SYNOVIAL FLUID: 9.5 mg/dL — AB (ref 0.0–6.0)

## 2016-01-24 LAB — PROTEIN, SYNOVIAL FLUID: PROTEIN, SYNOVIAL FLUID: 4.5 g/dL — AB (ref 1.0–3.0)

## 2016-01-24 LAB — GLUCOSE, SYNOVIAL FLUID

## 2016-01-28 LAB — CULTURE, BLOOD (ROUTINE X 2): Culture: NO GROWTH

## 2016-01-28 LAB — CULTURE, BODY FLUID-BOTTLE: CULTURE: NO GROWTH

## 2016-01-28 LAB — CULTURE, BODY FLUID W GRAM STAIN -BOTTLE

## 2016-11-15 ENCOUNTER — Other Ambulatory Visit: Payer: Self-pay | Admitting: Family Medicine

## 2016-11-15 DIAGNOSIS — I10 Essential (primary) hypertension: Secondary | ICD-10-CM

## 2017-01-04 ENCOUNTER — Other Ambulatory Visit: Payer: Self-pay | Admitting: Internal Medicine

## 2017-01-04 DIAGNOSIS — I10 Essential (primary) hypertension: Secondary | ICD-10-CM

## 2017-01-04 MED ORDER — METOPROLOL TARTRATE 50 MG PO TABS: 50.0000 mg | ORAL_TABLET | Freq: Two times a day (BID) | ORAL | 2 refills | Status: DC

## 2017-01-04 MED ORDER — LISINOPRIL 40 MG PO TABS: 40.0000 mg | ORAL_TABLET | Freq: Every day | ORAL | 2 refills | Status: AC

## 2017-01-04 MED ORDER — HYDROCHLOROTHIAZIDE 25 MG PO TABS: 25.0000 mg | ORAL_TABLET | Freq: Every day | ORAL | 2 refills | Status: DC

## 2017-01-04 NOTE — Telephone Encounter (Signed)
pt calling to request refill of:  Name of Medication(s):blood pressure pills-can remember name Last date of OV:  Has appt scheduled  01-22-17 Pharmacy: [facility] [address]   Will route refill request to Clinic RN.  Discussed with patient policy to call pharmacy for future refills.  Also, discussed refills may take up to 48 hours to approve or deny.  Roseanna Rainbow

## 2017-01-22 ENCOUNTER — Ambulatory Visit: Payer: Self-pay | Admitting: Internal Medicine

## 2017-11-23 ENCOUNTER — Other Ambulatory Visit: Payer: Self-pay | Admitting: *Deleted

## 2017-11-23 DIAGNOSIS — Z1231 Encounter for screening mammogram for malignant neoplasm of breast: Secondary | ICD-10-CM

## 2017-12-12 ENCOUNTER — Emergency Department (HOSPITAL_COMMUNITY): Payer: Self-pay

## 2017-12-12 ENCOUNTER — Encounter (HOSPITAL_COMMUNITY): Payer: Self-pay | Admitting: *Deleted

## 2017-12-12 ENCOUNTER — Other Ambulatory Visit: Payer: Self-pay

## 2017-12-12 ENCOUNTER — Emergency Department (HOSPITAL_COMMUNITY)
Admission: EM | Admit: 2017-12-12 | Discharge: 2017-12-12 | Disposition: A | Discharge status: Home / Self Care | Payer: Self-pay | Attending: Emergency Medicine | Admitting: Emergency Medicine

## 2017-12-12 DIAGNOSIS — I10 Essential (primary) hypertension: Secondary | ICD-10-CM | POA: Insufficient documentation

## 2017-12-12 DIAGNOSIS — W19XXXA Unspecified fall, initial encounter: Secondary | ICD-10-CM | POA: Insufficient documentation

## 2017-12-12 DIAGNOSIS — Z79899 Other long term (current) drug therapy: Secondary | ICD-10-CM | POA: Insufficient documentation

## 2017-12-12 DIAGNOSIS — Y999 Unspecified external cause status: Secondary | ICD-10-CM | POA: Insufficient documentation

## 2017-12-12 DIAGNOSIS — N3091 Cystitis, unspecified with hematuria: Secondary | ICD-10-CM | POA: Insufficient documentation

## 2017-12-12 DIAGNOSIS — Y939 Activity, unspecified: Secondary | ICD-10-CM | POA: Insufficient documentation

## 2017-12-12 DIAGNOSIS — R4020 Unspecified coma: Secondary | ICD-10-CM

## 2017-12-12 DIAGNOSIS — Z87891 Personal history of nicotine dependence: Secondary | ICD-10-CM | POA: Insufficient documentation

## 2017-12-12 DIAGNOSIS — Z853 Personal history of malignant neoplasm of breast: Secondary | ICD-10-CM | POA: Insufficient documentation

## 2017-12-12 DIAGNOSIS — S0990XA Unspecified injury of head, initial encounter: Secondary | ICD-10-CM

## 2017-12-12 DIAGNOSIS — Y929 Unspecified place or not applicable: Secondary | ICD-10-CM | POA: Insufficient documentation

## 2017-12-12 DIAGNOSIS — S0093XA Contusion of unspecified part of head, initial encounter: Secondary | ICD-10-CM | POA: Insufficient documentation

## 2017-12-12 DIAGNOSIS — N3001 Acute cystitis with hematuria: Secondary | ICD-10-CM

## 2017-12-12 LAB — URINALYSIS, ROUTINE W REFLEX MICROSCOPIC
GLUCOSE, UA: NEGATIVE mg/dL
KETONES UR: 5 mg/dL — AB
NITRITE: POSITIVE — AB
Specific Gravity, Urine: 1.022 (ref 1.005–1.030)
pH: 5 (ref 5.0–8.0)

## 2017-12-12 LAB — BASIC METABOLIC PANEL
Anion gap: 12 (ref 5–15)
BUN: 16 mg/dL (ref 6–20)
CALCIUM: 7.6 mg/dL — AB (ref 8.9–10.3)
CO2: 27 mmol/L (ref 22–32)
Chloride: 101 mmol/L (ref 101–111)
Creatinine, Ser: 1.1 mg/dL — ABNORMAL HIGH (ref 0.44–1.00)
GFR calc non Af Amer: 56 mL/min — ABNORMAL LOW (ref >60)
Glucose, Bld: 103 mg/dL — ABNORMAL HIGH (ref 65–99)
POTASSIUM: 3.5 mmol/L (ref 3.5–5.1)
Sodium: 140 mmol/L (ref 135–145)

## 2017-12-12 LAB — I-STAT TROPONIN, ED: TROPONIN I, POC: 0 ng/mL (ref 0.00–0.08)

## 2017-12-12 LAB — CBC
HEMATOCRIT: 30.7 % — AB (ref 36.0–46.0)
HEMOGLOBIN: 11.2 g/dL — AB (ref 12.0–15.0)
MCH: 38.6 pg — ABNORMAL HIGH (ref 26.0–34.0)
MCHC: 36.5 g/dL — ABNORMAL HIGH (ref 30.0–36.0)
MCV: 105.9 fL — AB (ref 78.0–100.0)
Platelets: 135 10*3/uL — ABNORMAL LOW (ref 150–400)
RBC: 2.9 MIL/uL — AB (ref 3.87–5.11)
RDW: 18.3 % — ABNORMAL HIGH (ref 11.5–15.5)
WBC: 3.9 10*3/uL — AB (ref 4.0–10.5)

## 2017-12-12 LAB — CBG MONITORING, ED: Glucose-Capillary: 124 mg/dL — ABNORMAL HIGH (ref 65–99)

## 2017-12-12 LAB — I-STAT BETA HCG BLOOD, ED (MC, WL, AP ONLY): I-stat hCG, quantitative: <5 m[IU]/mL (ref <5)

## 2017-12-12 MED ORDER — CEPHALEXIN 500 MG PO CAPS: 500.0000 mg | ORAL_CAPSULE | Freq: Three times a day (TID) | ORAL | 0 refills | Status: AC

## 2017-12-12 MED ORDER — HYDROCHLOROTHIAZIDE 25 MG PO TABS
25.0000 mg | ORAL_TABLET | Freq: Every day | ORAL | Status: DC
  Administered 2017-12-12: 25 mg via ORAL
  Filled 2017-12-12: qty 1

## 2017-12-12 MED ORDER — CEPHALEXIN 250 MG PO CAPS
500.0000 mg | ORAL_CAPSULE | Freq: Once | ORAL | Status: AC
  Administered 2017-12-12: 500 mg via ORAL
  Filled 2017-12-12: qty 2

## 2017-12-12 MED ORDER — LISINOPRIL 20 MG PO TABS
40.0000 mg | ORAL_TABLET | Freq: Once | ORAL | Status: AC
  Administered 2017-12-12: 40 mg via ORAL
  Filled 2017-12-12: qty 2

## 2017-12-12 NOTE — ED Notes (Signed)
Pt ambulatory with no dizziness x2 trips to the bathroom.

## 2017-12-12 NOTE — Discharge Instructions (Signed)
Please take full course of antibiotics as directed for your urinary tract infection.  You will also need to take your blood pressure medicine regularly.  Please follow-up with you PCP for a blood pressure recheck within the next week.  CT scans of your head and neck are reassuring, you likely have some soreness over your right eyebrow which should improve.  Return to the ED if you have fevers, vomiting, severe abdominal or flank pain or any other new or concerning symptoms.  You were examined today for a head injury and possible concussion. Your head CT showed no evidence of  Injury today.   Sometimes serious problems can develop after a head injury. Please return to the emergency department if you experience any of the following symptoms: Repeated vomiting Headache that gets worse and does not go away Loss of consciousness or inability to stay awake at times when you normally would be able to Getting more confused, restless or agitated Convulsions or seizures Difficulty walking or feeling off balance Weakness or numbness Vision changes

## 2017-12-12 NOTE — ED Triage Notes (Signed)
Pt here via [facility] from the [facility].  Initially pt was found, sitting in chair, very lethargic and with a hematoma above her R eyebrow.  Pt now states she felt her neck and the L of her mouth spasm, her hand was caught in the bathroom door and she fell to the floor hitting her head on the floor.  She does not remember how she ended up in a chair.  CBG 145. hr 91, rr 16, bp[ 152/113.  AO x 3.

## 2017-12-12 NOTE — ED Notes (Signed)
Patient transported to CT 

## 2017-12-12 NOTE — ED Provider Notes (Signed)
[facility] EMERGENCY DEPARTMENT Provider Note   CSN: [id number] Arrival date & time: 12/12/17  1435     History   Chief Complaint Chief Complaint  Patient presents with  . Loss of Consciousness    HPI [name] is a 57 y.o. female.  [name] is a 57 y.o. Female with a history of alcohol abuse, hypertension, hepatitis, and prior breast cancer, who presents to the ED for evaluation after an unwitnessed fall.  Patient presents from the [facility] via EMS, she was reportedly found sitting in a chair very lethargic with a hematoma over her right eyebrow, and was unable to tell staff or EMS how she got to the chair.  She reports to me that she was coming out of the bathroom and missed a step and tripped and fell because she noticed that her mouth and neck with spasming on the left side and this distracted her, she still not sure how she ended up in the chair.  At this time patient denies any headache, dizziness, vision changes, weakness, numbness or tingling in any extremities.  She denies any neck pain, chest pain, shortness of breath or abdominal pain.  Patient denies any urinary symptoms.  No recent fevers or chills, cough or URI symptoms.  Patient noted to be hypertensive with diastolic in the 782N, reports she is supposed to be on HCTZ and lisinopril but has not been able to afford it, she needs to pick it up from the [facility].     Past Medical History:  Diagnosis Date  . Alcoholic hepatitis   . Alcoholism ([place])   . Breast cancer ([place]) 2001   Rt side. Had lumpectomy, chemo and XRT by [facility]  . Hypertension   . Shortness of breath     Patient Active Problem List   Diagnosis Date Noted  . Cough 06/30/2012  . Heme + stool 03/15/2012  . Anemia 03/15/2012  . Necrotizing pneumonia ([place]) 03/06/2012  . Cholelithiasis 03/01/2012  . Abnormal finding on GI tract imaging 03/01/2012  . Occasional numbness/prickling/tingling of fingers and toes 12/15/2011  . Hypokalemia  12/15/2011  . Hypomagnesemia 08/06/2011  . Unspecified episodic mood disorder 07/24/2011  . Tobacco abuse 11/03/2010  . HYPERTRIGLYCERIDEMIA 01/25/2009  . Alcohol dependence ([place]) 10/11/2006  . NEUROPATHY, PERIPHERAL 10/11/2006  . HYPERTENSION, BENIGN SYSTEMIC 10/11/2006    Past Surgical History:  Procedure Laterality Date  . rt lumpectomy  2001     OB History   None      Home Medications    Prior to Admission medications   Medication Sig Start Date End Date Taking? Authorizing Provider  allopurinol (ZYLOPRIM) 100 MG tablet Take 1 tablet (100 mg total) by mouth 2 (two) times daily. 01/23/16   Joy, Shawn C, PA-C  colchicine 0.6 MG tablet Take 1 tablet (0.6 mg total) by mouth 2 (two) times daily. 01/23/16   Joy, Shawn C, PA-C  folic acid (FOLVITE) 1 MG tablet Take 1 mg by mouth daily.    [provider]  hydrochlorothiazide (HYDRODIURIL) 25 MG tablet Take 1 tablet (25 mg total) by mouth daily. 01/04/17   Verner Mould, MD  lisinopril (PRINIVIL,ZESTRIL) 40 MG tablet Take 1 tablet (40 mg total) by mouth daily. 01/04/17   Verner Mould, MD  metoprolol tartrate (LOPRESSOR) 50 MG tablet Take 1 tablet (50 mg total) by mouth 2 (two) times daily. 01/04/17   Verner Mould, MD  Multiple Vitamin (MULTIVITAMIN) tablet Take 1 tablet by mouth daily.  [provider]  naproxen (NAPROSYN) 500 MG tablet Take 1 tablet (500 mg total) by mouth 2 (two) times daily. 01/23/16   Joy, Shawn C, PA-C  oxyCODONE-acetaminophen (PERCOCET/ROXICET) 5-325 MG tablet Take 1 tablet by mouth every 4 (four) hours as needed for severe pain. 01/23/16   Joy, Shawn C, PA-C  predniSONE (DELTASONE) 10 MG tablet Take 2 tablets (20 mg total) by mouth daily. 01/23/16   Joy, Shawn C, PA-C  thiamine 100 MG tablet Take 100 mg by mouth daily.    [provider]    Family History No family history on file.  Social History Social History   Tobacco Use  . Smoking status:  Former Smoker    Packs/day: 0.50    Types: Cigarettes    Last attempt to quit: 11/02/2000    Years since quitting: 17.1  . Smokeless tobacco: Never Used  Substance Use Topics  . Alcohol use: Yes    Alcohol/week: 25.2 oz    Types: 42 Shots of liquor per week    Comment: Continues to drink  . Drug use: No     Allergies   Patient has no known allergies.   Review of Systems Review of Systems  Constitutional: Negative for chills and fever.  HENT: Positive for facial swelling (Hematoma over right eyebrow). Negative for congestion, rhinorrhea and sore throat.   Eyes: Negative for photophobia, pain and visual disturbance.  Respiratory: Negative for cough, chest tightness, shortness of breath and wheezing.   Cardiovascular: Negative for chest pain and palpitations.  Gastrointestinal: Negative for abdominal pain, nausea and vomiting.  Genitourinary: Positive for frequency. Negative for dysuria, flank pain, pelvic pain, vaginal bleeding and vaginal discharge.  Musculoskeletal: Negative for arthralgias, back pain, gait problem, joint swelling, myalgias, neck pain and neck stiffness.  Skin: Negative for color change, rash and wound.  Neurological: Negative for dizziness, seizures, facial asymmetry, weakness, light-headedness, numbness and headaches.     Physical Exam Updated Vital Signs BP (!) 152/116   Pulse 77   Temp 98.6 F (37 C) (Oral)   Resp 17   Ht 5\' 7"  (1.702 m)   Wt 54.4 kg (120 lb)   SpO2 100%   BMI 18.79 kg/m   Physical Exam  Constitutional: She is oriented to person, place, and time. She appears well-developed and well-nourished. No distress.  HENT:  Head: Normocephalic and atraumatic.  Obvious hematoma over the right eyebrow that is mildly tender to palpation, no palpable bony deformity, no other palpable hematoma, step-off or deformity over the scalp, no battle sign, bilateral TMs without hemotympanum or CSF otorrhea  Eyes: Pupils are equal, round, and reactive to  light. EOM are normal. Right eye exhibits no discharge. Left eye exhibits no discharge.  No nystagmus  Neck: Normal range of motion. Neck supple.  Patient in [place] c-collar on arrival, there is mild tenderness, primarily paraspinally, range of motion is intact  Cardiovascular: Normal rate, regular rhythm, normal heart sounds and intact distal pulses.  Pulmonary/Chest: Effort normal and breath sounds normal. No stridor. No respiratory distress. She has no wheezes. She has no rales.  Respirations equal and unlabored, patient able to speak in full sentences, lungs clear to auscultation bilaterally, chest nontender to palpation  Abdominal: Soft. Bowel sounds are normal. She exhibits no distension and no mass. There is no tenderness. There is no guarding.  Abdomen soft, nondistended, nontender to palpation in all quadrants, no peritoneal signs  Musculoskeletal: She exhibits no edema or deformity.  No midline T-spine or  L-spine tenderness Patient ambulatory and moving all joints without difficulty, all compartments soft  Neurological: She is alert and oriented to person, place, and time. Coordination normal.  Speech is clear, able to follow commands CN III-XII intact Normal strength in upper and lower extremities bilaterally including dorsiflexion and plantar flexion, strong and equal grip strength Sensation normal to light and sharp touch Moves extremities without ataxia, coordination intact  Skin: Skin is warm and dry. Capillary refill takes less than 2 seconds. She is not diaphoretic.  Psychiatric: She has a normal mood and affect. Her behavior is normal.  Nursing note and vitals reviewed.    ED Treatments / Results  Labs (all labs ordered are listed, but only abnormal results are displayed) Labs Reviewed  CBC - Abnormal; Notable for the following components:      Result Value   WBC 3.9 (*)    RBC 2.90 (*)    Hemoglobin 11.2 (*)    HCT 30.7 (*)    MCV 105.9 (*)    MCH 38.6 (*)     MCHC 36.5 (*)    RDW 18.3 (*)    Platelets 135 (*)    All other components within normal limits  URINALYSIS, ROUTINE W REFLEX MICROSCOPIC - Abnormal; Notable for the following components:   Color, Urine AMBER (*)    APPearance HAZY (*)    Hgb urine dipstick MODERATE (*)    Bilirubin Urine MODERATE (*)    Ketones, ur 5 (*)    Protein, ur >=300 (*)    Nitrite POSITIVE (*)    Leukocytes, UA MODERATE (*)    WBC, UA >50 (*)    Bacteria, UA FEW (*)    All other components within normal limits  BASIC METABOLIC PANEL - Abnormal; Notable for the following components:   Glucose, Bld 103 (*)    Creatinine, Ser 1.10 (*)    Calcium 7.6 (*)    GFR calc non Af Amer 56 (*)    All other components within normal limits  CBG MONITORING, ED - Abnormal; Notable for the following components:   Glucose-Capillary 124 (*)    All other components within normal limits  URINE CULTURE  I-STAT BETA HCG BLOOD, ED ([facility], [facility], [facility] ONLY)  I-STAT TROPONIN, ED    EKG EKG Interpretation  Date/Time:  Wednesday Dec 12 2017 15:06:16 EDT Ventricular Rate:  85 PR Interval:    QRS Duration: 91 QT Interval:  435 QTC Calculation: 518 R Axis:   -68 Text Interpretation:  Sinus rhythm Inferior infarct, old Prolonged QT interval NO STEMI Confirmed by Addison Lank [phone]) on 12/12/2017 3:20:57 PM   Radiology Ct Head Wo Contrast  Result Date: 12/12/2017 CLINICAL DATA:  Lethargic with hematoma over right eyebrow.  Fall. EXAM: CT HEAD WITHOUT CONTRAST CT CERVICAL SPINE WITHOUT CONTRAST TECHNIQUE: Multidetector CT imaging of the head and cervical spine was performed following the standard protocol without intravenous contrast. Multiplanar CT image reconstructions of the cervical spine were also generated. COMPARISON:  Head CT 03/08/2012 FINDINGS: CT HEAD FINDINGS Brain: There is no evidence for acute hemorrhage, hydrocephalus, mass lesion, or abnormal extra-axial fluid collection. No definite CT evidence for acute infarction.  Diffuse loss of parenchymal volume is consistent with atrophy. Patchy low attenuation in the deep hemispheric and periventricular white matter is nonspecific, but likely reflects chronic microvascular ischemic demyelination. Vascular: No hyperdense vessel or unexpected calcification. Skull: Normal. Negative for fracture or focal lesion. Sinuses/Orbits: Chronic polypoid mucosal disease is identified in the right maxillary sinus  with tiny air-fluid level noted in the left maxillary sinus. Visualized portions of the globes and intraorbital fat are unremarkable. Other: Right frontal scalp hematoma noted. CT CERVICAL SPINE FINDINGS Alignment: Straightening of normal cervical lordosis evident. Trace retrolisthesis of C5 on 6 and C6 on 7 is compatible with the degree of disc degeneration and each level. Skull base and vertebrae: No acute fracture. No primary bone lesion or focal pathologic process. Soft tissues and spinal canal: No prevertebral fluid or swelling. No visible canal hematoma. Disc levels:  Loss of disc height noted C5-6 and C6-7. Upper chest: 4 mm subpleural nodule noted right upper lobe (image 91/series 8) incompletely visualized pleuroparenchymal opacity in the posterior left hemithorax may be scarring given the degree of airspace disease seen in this region on prior chest CT of 03/05/2012. Other: None. IMPRESSION: 1. No acute intracranial abnormality. Atrophy with chronic small vessel white matter disease. 2. Degenerative changes at C5-6 and C6-7 without cervical spine fracture. 3. Chronic paranasal sinus disease with tiny air-fluid level in the left maxillary sinus compatible with hemorrhage or acute on chronic sinusitis. 4. 4 mm subpleural nodule right upper lung with irregular pleuroparenchymal opacity in the posterior left upper lobe, incompletely visualized. These changes may be related to scarring, but dedicated CT chest recommended to further evaluate. Electronically Signed   By: Misty Stanley M.D.    On: 12/12/2017 17:17   Ct Cervical Spine Wo Contrast  Result Date: 12/12/2017 CLINICAL DATA:  Lethargic with hematoma over right eyebrow.  Fall. EXAM: CT HEAD WITHOUT CONTRAST CT CERVICAL SPINE WITHOUT CONTRAST TECHNIQUE: Multidetector CT imaging of the head and cervical spine was performed following the standard protocol without intravenous contrast. Multiplanar CT image reconstructions of the cervical spine were also generated. COMPARISON:  Head CT 03/08/2012 FINDINGS: CT HEAD FINDINGS Brain: There is no evidence for acute hemorrhage, hydrocephalus, mass lesion, or abnormal extra-axial fluid collection. No definite CT evidence for acute infarction. Diffuse loss of parenchymal volume is consistent with atrophy. Patchy low attenuation in the deep hemispheric and periventricular white matter is nonspecific, but likely reflects chronic microvascular ischemic demyelination. Vascular: No hyperdense vessel or unexpected calcification. Skull: Normal. Negative for fracture or focal lesion. Sinuses/Orbits: Chronic polypoid mucosal disease is identified in the right maxillary sinus with tiny air-fluid level noted in the left maxillary sinus. Visualized portions of the globes and intraorbital fat are unremarkable. Other: Right frontal scalp hematoma noted. CT CERVICAL SPINE FINDINGS Alignment: Straightening of normal cervical lordosis evident. Trace retrolisthesis of C5 on 6 and C6 on 7 is compatible with the degree of disc degeneration and each level. Skull base and vertebrae: No acute fracture. No primary bone lesion or focal pathologic process. Soft tissues and spinal canal: No prevertebral fluid or swelling. No visible canal hematoma. Disc levels:  Loss of disc height noted C5-6 and C6-7. Upper chest: 4 mm subpleural nodule noted right upper lobe (image 91/series 8) incompletely visualized pleuroparenchymal opacity in the posterior left hemithorax may be scarring given the degree of airspace disease seen in this  region on prior chest CT of 03/05/2012. Other: None. IMPRESSION: 1. No acute intracranial abnormality. Atrophy with chronic small vessel white matter disease. 2. Degenerative changes at C5-6 and C6-7 without cervical spine fracture. 3. Chronic paranasal sinus disease with tiny air-fluid level in the left maxillary sinus compatible with hemorrhage or acute on chronic sinusitis. 4. 4 mm subpleural nodule right upper lung with irregular pleuroparenchymal opacity in the posterior left upper lobe, incompletely visualized. These changes may  be related to scarring, but dedicated CT chest recommended to further evaluate. Electronically Signed   By: Misty Stanley M.D.   On: 12/12/2017 17:17    Procedures Procedures (including critical care time)  Medications Ordered in ED Medications  hydrochlorothiazide (HYDRODIURIL) tablet 25 mg (25 mg Oral Given 12/12/17 1837)  lisinopril (PRINIVIL,ZESTRIL) tablet 40 mg (40 mg Oral Given 12/12/17 1836)  cephALEXin (KEFLEX) capsule 500 mg (500 mg Oral Given 12/12/17 1837)     Initial Impression / Assessment and Plan / ED Course  I have reviewed the triage vital signs and the nursing notes.  Pertinent labs & imaging results that were available during my care of the patient were reviewed by me and considered in my medical decision making (see chart for details).  Vision presents to the ED after unwitnessed fall with loss of consciousness.  Patient reports she thinks she missed a step and fell coming out of the bathroom, there is obvious hematoma to the right eyebrow, she denies any headache, dizziness or vision changes at this time.  Normal neurologic exam.  Given the patient cannot room for the details of her fall will get CT of the head and C-spine, as well as basic lab work.  On arrival patient is hypertensive but vitals are otherwise normal.  Chest and abdomen nontender palpation no other midline spinal tenderness.  Troponin negative, EKG without concerning ischemic  changes, no acute metabolic derangements requiring intervention, patient's creatinine is actually improved from baseline at 1.10 today.  No leukocytosis, hemoglobin is stable when compared to previous.  Urinalysis is concerning for infection with positive nitrates and leukocytes, patient does endorse some urinary frequency, she has no flank pain on exam.  Will treat with Keflex will also give patient doses of her home blood pressure medications here.  Shows no acute intracranial abnormality, there are degenerative changes of the C-spine without any acute fracture or traumatic malalignment. CT does show a 4 mm subpleural nodule in the right upper lung, with irregular opacity, recommend follow up with chest CT, will have PT follow up with her PCP regarding this.  Pt's blood pressure was elevated today, pt has hx of HTN, not taking their medications, pt is not exhibiting any symptoms to suggest hypertensive urgency or emergency today, patient given her lisinopril and HCTZ here in the ED with improvement in pressure, will have pt follow up with their PCP in 1 week for blood pressure check. Discussed long term consequences of untreated hypertension with the patient.  Patient is stable for discharge home, prescription for Keflex provided.  Strict return precautions discussed with the patient she is to follow-up with her primary care doctor within the next week for reevaluation.   12/12/17 1845 12/12/17 1915 12/12/17 1930  BP: (!) 164/119 (!) 174/108 (!) 143/99  Pulse: 79 89 77  Resp: 16 17 16   Temp:     TempSrc:     SpO2: 98% 100% 100%  Weight:     Height:        Final Clinical Impressions(s) / ED Diagnoses   Final diagnoses:  Loss of consciousness ([place])  Injury of head, initial encounter  Acute cystitis with hematuria  Traumatic hematoma of head, initial encounter  Hypertension, unspecified type    ED Discharge Orders        Ordered    cephALEXin (KEFLEX) 500 MG capsule  3 times daily      12/12/17 1955       Jacqlyn Larsen, [place] 12/12/17 2229  Fatima Blank, MD 12/13/17 1344

## 2017-12-12 NOTE — ED Notes (Signed)
Discharge instructions and prescriptions discussed with Pt. Pt verbalized understanding. Pt stable and ambulatory.   

## 2017-12-15 LAB — URINE CULTURE

## 2017-12-16 ENCOUNTER — Telehealth: Payer: Self-pay

## 2017-12-16 NOTE — Telephone Encounter (Signed)
Post ED Visit - Positive Culture Follow-up  Culture report reviewed by antimicrobial stewardship pharmacist:  []  Elenor Quinones, Pharm.D. []  Heide Guile, Pharm.D., BCPS AQ-ID [x]  Parks Neptune, Pharm.D., BCPS []  Alycia Rossetti, Pharm.D., BCPS []  [place], [place].D., BCPS, AAHIVP []  Legrand Como, Pharm.D., BCPS, AAHIVP []  Salome Arnt, PharmD, BCPS []  Wynell Balloon, PharmD []  Vincenza Hews, PharmD, BCPS  Positive urine culture Treated with Cephalexin, organism sensitive to the same and no further patient follow-up is required at this time.  Genia Del 12/16/2017, 11:05 AM

## 2018-01-29 ENCOUNTER — Ambulatory Visit
Admission: RE | Admit: 2018-01-29 | Discharge: 2018-01-29 | Disposition: A | Discharge status: Home / Self Care | Payer: No Typology Code available for payment source | Source: Ambulatory Visit | Attending: *Deleted | Admitting: *Deleted

## 2018-01-29 DIAGNOSIS — Z1231 Encounter for screening mammogram for malignant neoplasm of breast: Secondary | ICD-10-CM

## 2018-01-29 HISTORY — DX: Personal history of antineoplastic chemotherapy: Z92.21

## 2018-01-29 HISTORY — DX: Personal history of irradiation: Z92.3

## 2018-02-19 ENCOUNTER — Other Ambulatory Visit: Payer: Self-pay

## 2018-02-19 ENCOUNTER — Emergency Department (HOSPITAL_COMMUNITY)
Admission: EM | Admit: 2018-02-19 | Discharge: 2018-02-20 | Disposition: A | Discharge status: Home / Self Care | Payer: No Typology Code available for payment source | Attending: Emergency Medicine | Admitting: Emergency Medicine

## 2018-02-19 ENCOUNTER — Encounter (HOSPITAL_COMMUNITY): Payer: Self-pay

## 2018-02-19 DIAGNOSIS — I10 Essential (primary) hypertension: Secondary | ICD-10-CM | POA: Insufficient documentation

## 2018-02-19 DIAGNOSIS — F10920 Alcohol use, unspecified with intoxication, uncomplicated: Secondary | ICD-10-CM | POA: Insufficient documentation

## 2018-02-19 DIAGNOSIS — Z87891 Personal history of nicotine dependence: Secondary | ICD-10-CM | POA: Insufficient documentation

## 2018-02-19 DIAGNOSIS — Z853 Personal history of malignant neoplasm of breast: Secondary | ICD-10-CM | POA: Insufficient documentation

## 2018-02-19 DIAGNOSIS — Z79899 Other long term (current) drug therapy: Secondary | ICD-10-CM | POA: Insufficient documentation

## 2018-02-19 NOTE — ED Provider Notes (Signed)
[facility] DEPT Provider Note   CSN: [id number] Arrival date & time: 02/19/18  1802     History   Chief Complaint Chief Complaint  Patient presents with  . Alcohol Intoxication   Level 5 caveat due to alcohol intoxication HPI SUI [name] is a 57 y.o. female with history of alcoholic hepatitis, alcoholism, hypertension presents brought in by EMS for alcohol intoxication.  Per triage note, EMS was called by [employer] when the patient was found walking around [facility] stream with an empty pint of vodka in her hands.  They stated that she was slurring her speech and was "visibly impaired ".  The patient is sleepy but easily arousable, smells of alcohol on her breath.  She follows some commands but not others.  She is somewhat confused.  The history is provided by the patient and the EMS personnel. The history is limited by the condition of the patient.    Past Medical History:  Diagnosis Date  . Alcoholic hepatitis   . Alcoholism ([place])   . Breast cancer ([place]) 2001   Rt side. Had lumpectomy, chemo and XRT by [facility]  . Hypertension   . Personal history of chemotherapy   . Personal history of radiation therapy   . Shortness of breath     Patient Active Problem List   Diagnosis Date Noted  . Cough 06/30/2012  . Heme + stool 03/15/2012  . Anemia 03/15/2012  . Necrotizing pneumonia ([place]) 03/06/2012  . Cholelithiasis 03/01/2012  . Abnormal finding on GI tract imaging 03/01/2012  . Occasional numbness/prickling/tingling of fingers and toes 12/15/2011  . Hypokalemia 12/15/2011  . Hypomagnesemia 08/06/2011  . Unspecified episodic mood disorder 07/24/2011  . Tobacco abuse 11/03/2010  . HYPERTRIGLYCERIDEMIA 01/25/2009  . Alcohol dependence ([place]) 10/11/2006  . NEUROPATHY, PERIPHERAL 10/11/2006  . HYPERTENSION, BENIGN SYSTEMIC 10/11/2006    Past Surgical History:  Procedure Laterality Date  . BREAST LUMPECTOMY Right 2001  . rt lumpectomy  2001      OB History   None      Home Medications    Prior to Admission medications   Medication Sig Start Date End Date Taking? Authorizing Provider  hydrochlorothiazide (HYDRODIURIL) 25 MG tablet Take 1 tablet (25 mg total) by mouth daily. 01/04/17  Yes Verner Mould, MD  lisinopril (PRINIVIL,ZESTRIL) 40 MG tablet Take 1 tablet (40 mg total) by mouth daily. 01/04/17  Yes Verner Mould, MD  metoprolol tartrate (LOPRESSOR) 50 MG tablet Take 1 tablet (50 mg total) by mouth 2 (two) times daily. 01/04/17  Yes Verner Mould, MD    Family History History reviewed. No pertinent family history.  Social History Social History   Tobacco Use  . Smoking status: Former Smoker    Packs/day: 0.50    Types: Cigarettes    Last attempt to quit: 11/02/2000    Years since quitting: 17.3  . Smokeless tobacco: Never Used  Substance Use Topics  . Alcohol use: Yes    Alcohol/week: 25.2 oz    Types: 42 Shots of liquor per week    Comment: Continues to drink  . Drug use: No     Allergies   Patient has no known allergies.   Review of Systems Review of Systems  Unable to perform ROS: Mental status change     Physical Exam Updated Vital Signs BP 97/70 (BP Location: Right Arm)   Pulse 69   Temp 98.4 F (36.9 C) (Oral)   Resp 16   SpO2  99%   Physical Exam  Constitutional: She appears well-developed and well-nourished. No distress.  Sleeping comfortably in bed.  Clothing is somewhat dirty.  Smells of alcohol.  HENT:  Head: Normocephalic and atraumatic.  Eyes: Pupils are equal, round, and reactive to light. Conjunctivae and EOM are normal. Right eye exhibits no discharge. Left eye exhibits no discharge.  Neck: No JVD present. No tracheal deviation present.  Cardiovascular: Normal rate, regular rhythm and intact distal pulses.  Pulmonary/Chest: Effort normal and breath sounds normal.  Abdominal: Soft. Bowel sounds are normal. She exhibits no distension.  There is no tenderness.  Musculoskeletal: She exhibits no edema.  Moves extremities spontaneously with good strength  Neurological: She is alert.  Patient is sleepy but easily arousable.  She is oriented to person but not place or time.  She follows some commands but not others.  No facial droop.  Good grip strength bilaterally.  Unable to assess gait at this time.  Skin: Skin is warm and dry. No erythema.  Psychiatric: She has a normal mood and affect. Her behavior is normal.  Nursing note and vitals reviewed.    ED Treatments / Results  Labs (all labs ordered are listed, but only abnormal results are displayed) Labs Reviewed - No data to display  EKG None  Radiology No results found.  Procedures Procedures (including critical care time)  Medications Ordered in ED Medications - No data to display   Initial Impression / Assessment and Plan / ED Course  I have reviewed the triage vital signs and the nursing notes.  Pertinent labs & imaging results that were available during my care of the patient were reviewed by me and considered in my medical decision making (see chart for details).     Patient presents brought in by EMS for alcohol intoxication.  She is afebrile, vital signs are at her baseline.  She is nontoxic in appearance.  She exhibits somewhat dysarthric speech, mildly confused.  Smells of alcohol.  Tells me that she had "a little bit of vodka today ".  Denies any other recreational drug use.  No evidence of DTs. Doubt acute encephalopathy.  Will observe and reassess. 10:53 PM Patient was able to tolerate p.o. food and fluids in the ED without difficulty.  She still has mildly dysarthric speech, ambulates with an unsteady gait, unable to heel walk or toe walk. 11:24 PM Signed out to oncoming provider PA McDonald.  When patient is able to ambulate with a steady gait she is clinically sober and will be stable for discharge at that time. Final Clinical Impressions(s) /  ED Diagnoses   Final diagnoses:  Alcoholic intoxication without complication [facility])    ED Discharge Orders    None       Debroah Baller 02/19/18 2325    Lacretia Leigh, MD 02/20/18 1418

## 2018-02-19 NOTE — ED Triage Notes (Signed)
EMS reports [employer] called EMS out for Intoxicated on [address], Pt in possession of empty pint of Vodka, slurring words, unsteady and visibly impaired.  BP 90/60 HR 86 Resp 18 Sp02 94 RA CBG 108

## 2018-02-19 NOTE — Discharge Instructions (Addendum)
Return to the emergency department if you develop new or worsening symptoms.

## 2018-02-19 NOTE — ED Provider Notes (Signed)
57 year old female received at signout from [facility] pending discharge when the patient is clinically sober. Per her HPI:   "[name] is a 56 y.o. female with history of alcoholic hepatitis, alcoholism, hypertension presents brought in by EMS for alcohol intoxication.  Per triage note, EMS was called by [employer] when the patient was found walking around [facility] stream with an empty pint of vodka in her hands.  They stated that she was slurring her speech and was "visibly impaired ".  The patient is sleepy but easily arousable, smells of alcohol on her breath.  She follows some commands but not others.  She is somewhat confused.  The history is provided by the patient and the EMS personnel. The history is limited by the condition of the patient."   Physical Exam  BP 91/64 (BP Location: Right Arm)   Pulse 73   Temp 98.4 F (36.9 C) (Oral)   Resp 14   SpO2 97%   Physical Exam  No slurred speech.  Speaks in complete, fluent sentences.  No acute distress.  ED Course/Procedures     Procedures  MDM   57 year old female with a history of alcoholic hepatitis, alcoholism, and hypertension received a signout from [facility] pending discharge to home after the patient is clinically sober.  Patient was observed for 12.5 hours in the emergency department.  She remained asleep, but was easily arousable.  No slurred speech.  She appears clinically sober.  She states that she is ready for discharge at this time.  She is ambulatory without difficulty.  She has been able to eat and drink.  She is safe for discharge home at this time.         Joline Maxcy A, PA-C 02/20/18 0645    Ripley Fraise, MD 02/20/18 [phone]

## 2018-02-19 NOTE — ED Notes (Signed)
Bed: [facility] Expected date:  Expected time:  Means of arrival:  Comments: ETOH 

## 2018-02-20 NOTE — ED Notes (Signed)
ED Provider at bedside. 

## 2018-03-28 NOTE — Congregational Nurse Program (Signed)
Requested B/P check.  States has just been approved for Medicaid/SSI.  Discussed with client that she will need to find another health care provider as [facility] will not be able to see her.

## 2020-02-24 ENCOUNTER — Inpatient Hospital Stay: Payer: Medicaid Other

## 2020-02-24 ENCOUNTER — Inpatient Hospital Stay: Payer: Medicaid Other | Attending: Oncology | Admitting: Oncology

## 2020-02-24 ENCOUNTER — Other Ambulatory Visit: Payer: Self-pay

## 2020-02-24 ENCOUNTER — Telehealth: Payer: Self-pay | Admitting: *Deleted

## 2020-02-24 ENCOUNTER — Encounter: Payer: Self-pay | Admitting: Oncology

## 2020-02-24 VITALS — BP 90/68 | HR 97 | Temp 97.7°F | Resp 16 | Ht 67.0 in | Wt 90.9 lb

## 2020-02-24 DIAGNOSIS — F1029 Alcohol dependence with unspecified alcohol-induced disorder: Secondary | ICD-10-CM | POA: Diagnosis not present

## 2020-02-24 DIAGNOSIS — F1020 Alcohol dependence, uncomplicated: Secondary | ICD-10-CM | POA: Insufficient documentation

## 2020-02-24 DIAGNOSIS — Z79899 Other long term (current) drug therapy: Secondary | ICD-10-CM | POA: Insufficient documentation

## 2020-02-24 DIAGNOSIS — R634 Abnormal weight loss: Secondary | ICD-10-CM | POA: Diagnosis not present

## 2020-02-24 DIAGNOSIS — D649 Anemia, unspecified: Secondary | ICD-10-CM | POA: Diagnosis not present

## 2020-02-24 DIAGNOSIS — R531 Weakness: Secondary | ICD-10-CM | POA: Insufficient documentation

## 2020-02-24 DIAGNOSIS — D539 Nutritional anemia, unspecified: Secondary | ICD-10-CM

## 2020-02-24 DIAGNOSIS — R112 Nausea with vomiting, unspecified: Secondary | ICD-10-CM | POA: Diagnosis not present

## 2020-02-24 DIAGNOSIS — I10 Essential (primary) hypertension: Secondary | ICD-10-CM | POA: Insufficient documentation

## 2020-02-24 LAB — CBC WITH DIFFERENTIAL/PLATELET
Abs Immature Granulocytes: 0.03 10*3/uL (ref 0.00–0.07)
Basophils Absolute: 0.1 10*3/uL (ref 0.0–0.1)
Basophils Relative: 1 %
Eosinophils Absolute: 0.1 10*3/uL (ref 0.0–0.5)
Eosinophils Relative: 1 %
HCT: 29.3 % — ABNORMAL LOW (ref 36.0–46.0)
Hemoglobin: 9.8 g/dL — ABNORMAL LOW (ref 12.0–15.0)
Immature Granulocytes: 0 %
Lymphocytes Relative: 14 %
Lymphs Abs: 1.1 10*3/uL (ref 0.7–4.0)
MCH: 36 pg — ABNORMAL HIGH (ref 26.0–34.0)
MCHC: 33.4 g/dL (ref 30.0–36.0)
MCV: 107.7 fL — ABNORMAL HIGH (ref 80.0–100.0)
Monocytes Absolute: 0.2 10*3/uL (ref 0.1–1.0)
Monocytes Relative: 3 %
Neutro Abs: 5.9 10*3/uL (ref 1.7–7.7)
Neutrophils Relative %: 81 %
Platelets: 331 10*3/uL (ref 150–400)
RBC: 2.72 MIL/uL — ABNORMAL LOW (ref 3.87–5.11)
RDW: 16 % — ABNORMAL HIGH (ref 11.5–15.5)
WBC: 7.3 10*3/uL (ref 4.0–10.5)
nRBC: 0.7 % — ABNORMAL HIGH (ref 0.0–0.2)

## 2020-02-24 LAB — COMPREHENSIVE METABOLIC PANEL
ALT: 35 U/L (ref 0–44)
AST: 131 U/L — ABNORMAL HIGH (ref 15–41)
Albumin: 3.7 g/dL (ref 3.5–5.0)
Alkaline Phosphatase: 124 U/L (ref 38–126)
Anion gap: 37 — ABNORMAL HIGH (ref 5–15)
BUN: 42 mg/dL — ABNORMAL HIGH (ref 6–20)
CO2: 10 mmol/L — ABNORMAL LOW (ref 22–32)
Calcium: 8.8 mg/dL — ABNORMAL LOW (ref 8.9–10.3)
Chloride: 94 mmol/L — ABNORMAL LOW (ref 98–111)
Creatinine, Ser: 2.01 mg/dL — ABNORMAL HIGH (ref 0.44–1.00)
GFR calc Af Amer: 31 mL/min — ABNORMAL LOW (ref >60)
GFR calc non Af Amer: 27 mL/min — ABNORMAL LOW (ref >60)
Glucose, Bld: 32 mg/dL — CL (ref 70–99)
Potassium: 4 mmol/L (ref 3.5–5.1)
Sodium: 141 mmol/L (ref 135–145)
Total Bilirubin: 1.3 mg/dL — ABNORMAL HIGH (ref 0.3–1.2)
Total Protein: 8.3 g/dL — ABNORMAL HIGH (ref 6.5–8.1)

## 2020-02-24 LAB — IRON AND TIBC
Iron: 59 ug/dL (ref 28–170)
Saturation Ratios: 21 % (ref 10.4–31.8)
TIBC: 283 ug/dL (ref 250–450)
UIBC: 224 ug/dL

## 2020-02-24 LAB — RETICULOCYTES
Immature Retic Fract: 15.7 % (ref 2.3–15.9)
RBC.: 2.72 MIL/uL — ABNORMAL LOW (ref 3.87–5.11)
Retic Count, Absolute: 69.6 10*3/uL (ref 19.0–186.0)
Retic Ct Pct: 2.6 % (ref 0.4–3.1)

## 2020-02-24 LAB — FERRITIN: Ferritin: 340 ng/mL — ABNORMAL HIGH (ref 11–307)

## 2020-02-24 LAB — LACTATE DEHYDROGENASE: LDH: 221 U/L — ABNORMAL HIGH (ref 98–192)

## 2020-02-24 LAB — DAT, POLYSPECIFIC AHG (ARMC ONLY): Polyspecific AHG test: NEGATIVE

## 2020-02-24 NOTE — Progress Notes (Signed)
Pt states that she has trouble eating, sometimes she gets sick and vomits and it takes several days to start back eating. She drinks alcohol and water mostly. Sometimes tea. She has about 1 BM each week-does not take an stool softeners. She has leg pain bilaterally at times but not right now.

## 2020-02-24 NOTE — Addendum Note (Signed)
Addended by: Luella Cook on: 02/24/2020 02:50 PM   Modules accepted: Orders

## 2020-02-24 NOTE — Progress Notes (Signed)
Hematology/Oncology Consult note [facility] [phone] [phone]  Patient Care Team: Marliss Coots, NP as PCP - General   Name of the patient: [name]  [id number]  [birth year]    Reason for referral-anemia   Referring physician-Dr. Mare Loan  Date of visit: 02/24/20   History of presenting illness- Patient is a 59 year old African-American female with a past medical history significant for hypertension referred for anemia.  Most recent CBC from 02/17/2020 showed white count of 5.1, H&H of 10.1/28.7 with an MCV of 104 and a platelet count of 122.  LFTs and serum creatinine was normal.  B12 levels were normal at 510.  Folic acid and TSH was normal.  Patient has lost more than 10 pounds in the last 6 months.  She continues to drink alcohol every day and its mixture of hard liquor as well as beer.  She is unable to quantify what is her intake of alcohol but reports she takes at least 8 drinks daily and has been doing so for several years.  She reports having weakness in her bilateral legs which is also a chronic issue.  She reports having random episodes of nausea and she often has to throw up.  Denies any abdominal pain.  Denies any dark melanotic stools or bleeding in her stools  ECOG PS- 2  Pain scale- 0   Review of systems- Review of Systems  Constitutional: Positive for malaise/fatigue and weight loss. Negative for chills and fever.  HENT: Negative for congestion, ear discharge and nosebleeds.   Eyes: Negative for blurred vision.  Respiratory: Negative for cough, hemoptysis, sputum production, shortness of breath and wheezing.   Cardiovascular: Negative for chest pain, palpitations, orthopnea and claudication.  Gastrointestinal: Negative for abdominal pain, blood in stool, constipation, diarrhea, heartburn, melena, nausea and vomiting.  Genitourinary: Negative for dysuria, flank pain, frequency, hematuria and urgency.    Musculoskeletal: Negative for back pain, joint pain and myalgias.  Skin: Negative for rash.  Neurological: Negative for dizziness, tingling, focal weakness, seizures, weakness and headaches.  Endo/Heme/Allergies: Does not bruise/bleed easily.  Psychiatric/Behavioral: Negative for depression and suicidal ideas. The patient does not have insomnia.     No Known Allergies  Patient Active Problem List   Diagnosis Date Noted  . Cough 06/30/2012  . Heme + stool 03/15/2012  . Anemia 03/15/2012  . Necrotizing pneumonia ([place]) 03/06/2012  . Cholelithiasis 03/01/2012  . Abnormal finding on GI tract imaging 03/01/2012  . Occasional numbness/prickling/tingling of fingers and toes 12/15/2011  . Hypokalemia 12/15/2011  . Hypomagnesemia 08/06/2011  . Unspecified episodic mood disorder 07/24/2011  . Tobacco abuse 11/03/2010  . HYPERTRIGLYCERIDEMIA 01/25/2009  . Alcohol dependence ([place]) 10/11/2006  . NEUROPATHY, PERIPHERAL 10/11/2006  . HYPERTENSION, BENIGN SYSTEMIC 10/11/2006     Past Medical History:  Diagnosis Date  . Alcoholic hepatitis   . Alcoholism ([place])   . Breast cancer ([place]) 2001   Rt side. Had lumpectomy, chemo and XRT by [facility]  . Hypertension   . Personal history of chemotherapy   . Personal history of radiation therapy   . Shortness of breath      Past Surgical History:  Procedure Laterality Date  . BREAST LUMPECTOMY Right 2001  . rt lumpectomy  2001    Social History   Socioeconomic History  . Marital status: Single    Spouse name: Not on file  . Number of children: Not on file  . Years of education: Not on file  . Highest education level:  Not on file  Occupational History  . Not on file  Tobacco Use  . Smoking status: Former Smoker    Packs/day: 0.50    Types: Cigarettes    Quit date: 11/02/2000    Years since quitting: 19.3  . Smokeless tobacco: Never Used  Substance and Sexual Activity  . Alcohol use: Yes    Alcohol/week: 42.0 standard drinks    Types:  42 Shots of liquor per week    Comment: Continues to drink  . Drug use: No  . Sexual activity: Not Currently  Other Topics Concern  . Not on file  Social History Narrative  . Not on file   Social Determinants of Health   Financial Resource Strain:   . Difficulty of Paying Living Expenses:   Food Insecurity:   . Worried About Charity fundraiser in the Last Year:   . Arboriculturist in the Last Year:   Transportation Needs:   . Film/video editor (Medical):   Marland Kitchen Lack of Transportation (Non-Medical):   Physical Activity:   . Days of Exercise per Week:   . Minutes of Exercise per Session:   Stress:   . Feeling of Stress :   Social Connections:   . Frequency of Communication with Friends and Family:   . Frequency of Social Gatherings with Friends and Family:   . Attends Religious Services:   . Active Member of Clubs or Organizations:   . Attends Archivist Meetings:   Marland Kitchen Marital Status:   Intimate Partner Violence:   . Fear of Current or Ex-Partner:   . Emotionally Abused:   Marland Kitchen Physically Abused:   . Sexually Abused:      No family history on file.   Current Outpatient Medications:  .  hydrochlorothiazide (HYDRODIURIL) 25 MG tablet, Take 1 tablet (25 mg total) by mouth daily., Disp: 90 tablet, Rfl: 2 .  lisinopril (PRINIVIL,ZESTRIL) 40 MG tablet, Take 1 tablet (40 mg total) by mouth daily., Disp: 90 tablet, Rfl: 2 .  metoprolol tartrate (LOPRESSOR) 50 MG tablet, Take 1 tablet (50 mg total) by mouth 2 (two) times daily., Disp: 180 tablet, Rfl: 2   Physical exam:  Vitals:   02/24/20 1108 02/24/20 1131  BP: 90/68   Pulse:  97  Resp:  16  Temp:  97.7 F (36.5 C)  TempSrc:  Tympanic  Weight: 90 lb 14 oz (41.2 kg)   Height: 5\' 7"  (1.702 m)    Physical Exam Constitutional:      Comments: Patient is thin and cachectic  HENT:     Head: Normocephalic and atraumatic.  Eyes:     Pupils: Pupils are equal, round, and reactive to light.  Cardiovascular:      Rate and Rhythm: Normal rate and regular rhythm.     Heart sounds: Normal heart sounds.  Pulmonary:     Effort: Pulmonary effort is normal.     Breath sounds: Normal breath sounds.  Abdominal:     General: Bowel sounds are normal. There is no distension.     Palpations: Abdomen is soft.     Tenderness: There is no abdominal tenderness.  Musculoskeletal:     Cervical back: Normal range of motion.  Lymphadenopathy:     Comments: No palpable cervical, supraclavicular, axillary or inguinal adenopathy   Skin:    General: Skin is warm and dry.  Neurological:     Mental Status: She is alert and oriented to person, place, and time.  CMP Latest Ref Rng & Units 12/12/2017  Glucose 65 - 99 mg/dL 103(H)  BUN 6 - 20 mg/dL 16  Creatinine 0.44 - 1.00 mg/dL 1.10(H)  Sodium 135 - 145 mmol/L 140  Potassium 3.5 - 5.1 mmol/L 3.5  Chloride 101 - 111 mmol/L 101  CO2 22 - 32 mmol/L 27  Calcium 8.9 - 10.3 mg/dL 7.6(L)  Total Protein 6.5 - 8.1 g/dL -  Total Bilirubin 0.3 - 1.2 mg/dL -  Alkaline Phos 38 - 126 U/L -  AST 15 - 41 U/L -  ALT 14 - 54 U/L -   CBC Latest Ref Rng & Units 12/12/2017  WBC 4.0 - 10.5 K/uL 3.9(L)  Hemoglobin 12.0 - 15.0 g/dL 11.2(L)  Hematocrit 36 - 46 % 30.7(L)  Platelets 150 - 400 K/uL 135(L)    Assessment and plan- Patient is a 59 y.o. female referred for macrocytic anemia  Macrocytic anemia: Likely secondary to alcohol intake.  B12, folate, TSH was normal.  I will do further anemia work-up including CBC with differential, CMP, myeloma panel, serum free light chains, reticulocyte count, haptoglobin, B1, B6 level, [place]om[place] test today.  Video in person visit with me next week  Given her ongoing weight loss I will get a CT abdomen and pelvis with contrast  Referred to gastroenterology for ongoing alcohol dependence and symptoms of nausea and vomiting.  She has also never undergone colonoscopy in the past   Thank you for this kind referral and the opportunity to  participate in the care of this patient   Visit Diagnosis 1. Macrocytic anemia   2. Weight loss, unintentional   3. Alcohol dependence with unspecified alcohol-induced disorder ([place])     Dr. Randa Evens, MD, MPH [facility] at [facility] [phone] 02/24/2020 1:22 PM

## 2020-02-24 NOTE — Telephone Encounter (Signed)
Called sister who brought pt today and let her know that pt has low sugar of 32. She states that she went and got food and a big sweet tea after getting out of cancer center. She did drink some of tea in the car and she was feeling ok. She will check on her by phone this evening and also she is coming to her house tom. I told her that she needs to get some juices in to keep sugar at a better number. Around 90's to 110 is great. She could pass out with sugar being 32. She thanked me for the info and will watch out for her and get her juices to drink. I also asked about GI ref. And we can send it to [facility] instead of [facility] and she is glad to get it in [place]. I told her we will put in referral to [facility] and [facility] will contact her about appt

## 2020-02-25 ENCOUNTER — Telehealth: Payer: Self-pay | Admitting: *Deleted

## 2020-02-25 LAB — HAPTOGLOBIN: Haptoglobin: 180 mg/dL (ref 33–346)

## 2020-02-25 LAB — KAPPA/LAMBDA LIGHT CHAINS
Kappa free light chain: 116.6 mg/L — ABNORMAL HIGH (ref 3.3–19.4)
Kappa, lambda light chain ratio: 0.86 (ref 0.26–1.65)
Lambda free light chains: 134.9 mg/L — ABNORMAL HIGH (ref 5.7–26.3)

## 2020-02-25 NOTE — Telephone Encounter (Signed)
Spoke to sherry in radiology about this pt. She was new to [place] as of yest. She had new pt labs and her creat 2.0. . Dr. Janese Banks would like pt to have I stat creat. The day of the scan to see if creat is good enough to get scan with contrast. If it is not good enough to get contrast and then they will need to call and we can change to without contrast. She put the order in and Janese Banks will need to sign it

## 2020-02-26 ENCOUNTER — Telehealth: Payer: Self-pay | Admitting: *Deleted

## 2020-02-26 LAB — MULTIPLE MYELOMA PANEL, SERUM
Albumin SerPl Elph-Mcnc: 3.8 g/dL (ref 2.9–4.4)
Albumin/Glob SerPl: 1.1 (ref 0.7–1.7)
Alpha 1: 0.3 g/dL (ref 0.0–0.4)
Alpha2 Glob SerPl Elph-Mcnc: 0.9 g/dL (ref 0.4–1.0)
B-Globulin SerPl Elph-Mcnc: 1.2 g/dL (ref 0.7–1.3)
Gamma Glob SerPl Elph-Mcnc: 1.5 g/dL (ref 0.4–1.8)
Globulin, Total: 3.8 g/dL (ref 2.2–3.9)
IgA: 539 mg/dL — ABNORMAL HIGH (ref 87–352)
IgG (Immunoglobin G), Serum: 1360 mg/dL (ref 586–1602)
IgM (Immunoglobulin M), Srm: 335 mg/dL — ABNORMAL HIGH (ref 26–217)
Total Protein ELP: 7.6 g/dL (ref 6.0–8.5)

## 2020-02-26 LAB — VITAMIN B1: Vitamin B1 (Thiamine): 104.4 nmol/L (ref 66.5–200.0)

## 2020-02-26 NOTE — Telephone Encounter (Signed)
Tedra Coupe in [place] at [facility] called reporting that patient is scheduled for CT with contrast 03/03/20, but her creatinine is 2.07 and is asking if you will change order to noncontrast CT or order a stat creat check the day of CT. Please advise

## 2020-02-26 NOTE — Telephone Encounter (Signed)
I called [facility] radiology and they said they will do istat creat the day of scan and if it is still elevated they will call us and ask for the scan to be changed.

## 2020-02-26 NOTE — Telephone Encounter (Signed)
[name]- see above. That was our plan to check stat creat and decide if with or without correct?

## 2020-02-27 ENCOUNTER — Encounter: Payer: Self-pay | Admitting: Gastroenterology

## 2020-02-28 LAB — VITAMIN B6: Vitamin B6: 5.3 ug/L (ref 2.0–32.8)

## 2020-03-01 ENCOUNTER — Other Ambulatory Visit: Payer: Self-pay | Admitting: *Deleted

## 2020-03-01 NOTE — Progress Notes (Signed)
Error

## 2020-03-03 ENCOUNTER — Ambulatory Visit (HOSPITAL_COMMUNITY)
Admission: RE | Admit: 2020-03-03 | Discharge: 2020-03-03 | Disposition: A | Discharge status: Home / Self Care | Payer: Medicaid Other | Source: Ambulatory Visit | Attending: Oncology | Admitting: Oncology

## 2020-03-03 ENCOUNTER — Other Ambulatory Visit: Payer: Self-pay

## 2020-03-03 ENCOUNTER — Encounter (HOSPITAL_COMMUNITY): Payer: Self-pay

## 2020-03-03 DIAGNOSIS — D539 Nutritional anemia, unspecified: Secondary | ICD-10-CM

## 2020-03-03 DIAGNOSIS — R634 Abnormal weight loss: Secondary | ICD-10-CM | POA: Diagnosis present

## 2020-03-03 LAB — POCT I-STAT CREATININE: Creatinine, Ser: 1.9 mg/dL — ABNORMAL HIGH (ref 0.44–1.00)

## 2020-03-03 MED ORDER — IOHEXOL 300 MG/ML  SOLN
75.0000 mL | Freq: Once | INTRAMUSCULAR | Status: AC | PRN
  Administered 2020-03-03: 60 mL via INTRAVENOUS

## 2020-03-04 ENCOUNTER — Encounter: Payer: Self-pay | Admitting: Oncology

## 2020-03-04 ENCOUNTER — Inpatient Hospital Stay (HOSPITAL_BASED_OUTPATIENT_CLINIC_OR_DEPARTMENT_OTHER): Payer: Medicaid Other | Admitting: Oncology

## 2020-03-04 DIAGNOSIS — D539 Nutritional anemia, unspecified: Secondary | ICD-10-CM | POA: Diagnosis not present

## 2020-03-04 NOTE — Progress Notes (Signed)
I connected with [name] on 03/04/20 at  2:45 PM EDT by video enabled telemedicine visit and verified that I am speaking with the correct person using two identifiers.   I discussed the limitations, risks, security and privacy concerns of performing an evaluation and management service by telemedicine and the availability of in-person appointments. I also discussed with the patient that there may be a patient responsible charge related to this service. The patient expressed understanding and agreed to proceed.  Other persons participating in the visit and their role in the encounter:  Patients sister  Patient's location:  car Provider's location:  work  Risk analyst Complaint: Discuss results of blood work  Diagnosis: Macrocytic anemia likely secondary to alcohol use  History of present illness: Patient is a 59 year old African-American female with a past medical history significant for hypertension referred for anemia.  Most recent CBC from 02/17/2020 showed white count of 5.1, H&H of 10.1/28.7 with an MCV of 104 and a platelet count of 122.  LFTs and serum creatinine was normal.  B12 levels were normal at 510.  Folic acid and TSH was normal.  Patient has lost more than 10 pounds in the last 6 months.  She continues to drink alcohol every day and its mixture of hard liquor as well as beer.  She is unable to quantify what is her intake of alcohol but reports she takes at least 8 drinks daily and has been doing so for several years.  She reports having weakness in her bilateral legs which is also a chronic issue.  She reports having random episodes of nausea and she often has to throw up.  Denies any abdominal pain.  Denies any dark melanotic stools or bleeding in her stools  Results of blood work from 02/24/2020 showed normal B1 and B6 level.  LDH was mildly elevated at 221.  Coombs test was negative.  Myeloma panel was unremarkable.  Iron studies showed an elevated ferritin of 340.  Iron studies were  normal.  CBC showed H&H of 9.8/29.3 with an MCV of 107.  White count was normal at 7.3 and platelets normal at 331.  CMP showed elevated AST of 131 and elevated total bilirubin of 1.3.  CT abdomen did not show any evidence of malignancy.  Severe hepatic steatosis.  Features of chronic pancreatitis.  Interval history patient reports chronic fatigue.  She reports exertional shortness of breath no new complaints at this time   Review of Systems  Constitutional: Positive for malaise/fatigue. Negative for chills, fever and weight loss.  HENT: Negative for congestion, ear discharge and nosebleeds.   Eyes: Negative for blurred vision.  Respiratory: Positive for shortness of breath. Negative for cough, hemoptysis, sputum production and wheezing.   Cardiovascular: Negative for chest pain, palpitations, orthopnea and claudication.  Gastrointestinal: Negative for abdominal pain, blood in stool, constipation, diarrhea, heartburn, melena, nausea and vomiting.  Genitourinary: Negative for dysuria, flank pain, frequency, hematuria and urgency.  Musculoskeletal: Negative for back pain, joint pain and myalgias.  Skin: Negative for rash.  Neurological: Negative for dizziness, tingling, focal weakness, seizures, weakness and headaches.  Endo/Heme/Allergies: Does not bruise/bleed easily.  Psychiatric/Behavioral: Negative for depression and suicidal ideas. The patient does not have insomnia.     No Known Allergies  Past Medical History:  Diagnosis Date  . Alcoholic hepatitis   . Alcoholism ([place])   . Anemia   . Breast cancer ([place]) 2001   Rt side. Had lumpectomy, chemo and XRT by [facility]  . Hypertension   .  Personal history of chemotherapy   . Personal history of radiation therapy   . Shortness of breath     Past Surgical History:  Procedure Laterality Date  . BREAST LUMPECTOMY Right 2001  . rt lumpectomy  2001    Social History   Socioeconomic History  . Marital status: Single    Spouse name: Not  on file  . Number of children: Not on file  . Years of education: Not on file  . Highest education level: Not on file  Occupational History  . Not on file  Tobacco Use  . Smoking status: Former Smoker    Packs/day: 0.50    Types: Cigarettes    Quit date: 11/02/2000    Years since quitting: 19.3  . Smokeless tobacco: Never Used  . Tobacco comment: unknown but before 2002  Vaping Use  . Vaping Use: Never used  Substance and Sexual Activity  . Alcohol use: Yes    Alcohol/week: 42.0 standard drinks    Types: 42 Shots of liquor per week    Comment: -unknown amount pt. drinks beer and liquor  . Drug use: No  . Sexual activity: Not Currently  Other Topics Concern  . Not on file  Social History Narrative  . Not on file   Social Determinants of Health   Financial Resource Strain:   . Difficulty of Paying Living Expenses:   Food Insecurity:   . Worried About Charity fundraiser in the Last Year:   . Arboriculturist in the Last Year:   Transportation Needs:   . Film/video editor (Medical):   Marland Kitchen Lack of Transportation (Non-Medical):   Physical Activity:   . Days of Exercise per Week:   . Minutes of Exercise per Session:   Stress:   . Feeling of Stress :   Social Connections:   . Frequency of Communication with Friends and Family:   . Frequency of Social Gatherings with Friends and Family:   . Attends Religious Services:   . Active Member of Clubs or Organizations:   . Attends Archivist Meetings:   Marland Kitchen Marital Status:   Intimate Partner Violence:   . Fear of Current or Ex-Partner:   . Emotionally Abused:   Marland Kitchen Physically Abused:   . Sexually Abused:     Family History  Problem Relation Age of Onset  . Leukemia Brother   . Lung cancer Maternal Aunt      Current Outpatient Medications:  .  hydrochlorothiazide (HYDRODIURIL) 25 MG tablet, Take 1 tablet (25 mg total) by mouth daily., Disp: 90 tablet, Rfl: 2 .  lisinopril (PRINIVIL,ZESTRIL) 40 MG tablet, Take  1 tablet (40 mg total) by mouth daily., Disp: 90 tablet, Rfl: 2 .  metoprolol tartrate (LOPRESSOR) 50 MG tablet, Take 1 tablet (50 mg total) by mouth 2 (two) times daily., Disp: 180 tablet, Rfl: 2 .  Vitamin D, Ergocalciferol, (DRISDOL) 1.25 MG (50000 UNIT) CAPS capsule, Take 50,000 Units by mouth every 7 (seven) days., Disp: , Rfl:   CT Abdomen Pelvis W Contrast  Result Date: 03/03/2020 CLINICAL DATA:  Weight loss, macrocytic anemia and history of RIGHT breast cancer EXAM: CT ABDOMEN AND PELVIS WITH CONTRAST TECHNIQUE: Multidetector CT imaging of the abdomen and pelvis was performed using the standard protocol following bolus administration of intravenous contrast. CONTRAST:  34mL OMNIPAQUE IOHEXOL 300 MG/ML  SOLN COMPARISON:  02/29/2012 the most recent CT comparison. MRI-MRCP from 2013 as well. FINDINGS: Lower chest: Basilar atelectasis. No consolidation. No  pleural effusion. Limited assessment of the lung bases and upper abdomen due to respiratory motion. Hepatobiliary: Severe hepatic steatosis. No suspicious focal hepatic lesion. No pericholecystic stranding. Cholelithiasis without biliary duct dilation. Pattern of calcification in the pancreatic head is similar to the prior study. Previous MRCP suggested select suggested raise the question of small distal common bile duct stones. Calcifications in the region of the distal common bile duct and pancreatic head appear to be more centered in the pancreatic duct on today's exam. Pancreas: Pancreatic atrophy and calcifications in the pancreatic head. No visible mass. Significant change aside from mild increase in atrophy of the pancreas when compared to the previous examination. No peripancreatic stranding. Spleen: Spleen normal size and contour. Adrenals/Urinary Tract: Adrenal glands are normal. No suspicious renal lesion. Small low-density focus in the interpolar RIGHT kidney likely a small cyst. No hydronephrosis. Urinary bladder mildly distended. No  perivesical stranding. No perinephric stranding. Stomach/Bowel: No acute small bowel process. No pericecal stranding. Appendix is normal. Colon is relatively ahaustral with only limited distension but without pericolonic inflammation and is stool filled throughout its course. Vascular/Lymphatic: No retroperitoneal adenopathy. Vascular structures in the abdomen and pelvis are patent. No pelvic lymphadenopathy. Reproductive: No adnexal mass.  Atrophy of the uterus. Other: Stranding overlying the sacrum with irregularity near the upper margin of the gluteal cleft. No fluid collection. Best seen on image 71 of series 2. Musculoskeletal: Osteopenia. No acute bone finding or destructive bone process. Sclerosis in the bilateral femoral heads suggestive of mild AVN. IMPRESSION: 1. Cholelithiasis without definite evidence of cholecystitis and with pattern of calcifications in the pancreatic head unchanged from previous exam. Favor pancreatic ductal calcifications/chronic pancreatitis given above findings. Calcifications in this region do not appear to be centered within the common bile duct. 2. For above findings would suggest correlation with biliary enzymes to determine whether further evaluation of the biliary tree may be warranted. 3. Stranding overlying the sacrum with irregularity near the upper margin of the gluteal cleft. No fluid collection. Findings are concerning for sacral decubitus ulcer. 4. Severe hepatic steatosis. 5. Ahaustral appearance of the colon is nonspecific without signs of current inflammation. This may be due to prior inflammation. Appendix is normal. 6. Bilateral femoral AVN 7. Aortic atherosclerosis. Aortic Atherosclerosis (ICD10-I70.0). Electronically Signed   By: Zetta Bills M.D.   On: 03/03/2020 13:12    No images are attached to the encounter.   CMP Latest Ref Rng & Units 03/03/2020  Glucose 70 - 99 mg/dL -  BUN 6 - 20 mg/dL -  Creatinine 0.44 - 1.00 mg/dL 1.90(H)  Sodium 135 - 145  mmol/L -  Potassium 3.5 - 5.1 mmol/L -  Chloride 98 - 111 mmol/L -  CO2 22 - 32 mmol/L -  Calcium 8.9 - 10.3 mg/dL -  Total Protein 6.5 - 8.1 g/dL -  Total Bilirubin 0.3 - 1.2 mg/dL -  Alkaline Phos 38 - 126 U/L -  AST 15 - 41 U/L -  ALT 0 - 44 U/L -   CBC Latest Ref Rng & Units 02/24/2020  WBC 4.0 - 10.5 K/uL 7.3  Hemoglobin 12.0 - 15.0 g/dL 9.8(L)  Hematocrit 36 - 46 % 29.3(L)  Platelets 150 - 400 K/uL 331     Observation/objective: Patient is thin cachectic.  Appears in no acute distress over video visit today  Assessment and plan: Patient is a 59 year old female referred for macrocytic anemia  Anemia work-up reveals elevated ferritin and iron studies suggestive of chronic disease.  B12,  B1, B6 levels normal.  No evidence of hemolysis.  Myeloma panel unremarkable.  Suspect macrocytic anemia secondary to ongoing alcohol use.  I strongly counseled the patient to stop alcohol intake.  CT abdomen also shows features of chronic pancreatitis and severe hepatic steatosis all secondary to alcohol use.  She does not require any further hematologic work-up at this time since her anemia also seems to be secondary to alcohol induced myelosuppression.  Shortness of breath: I have asked her to get in touch with her primary care provider Jack Quarto, CNP for further investigation.  She is a lifetime non-smoker  Follow-up instructions: Repeat CBC ferritin iron studies in 3 andll see her back in 3 months.  She also has a GI new patient appointment coming up soon  I discussed the assessment and treatment plan with the patient. The patient was provided an opportunity to ask questions and all were answered. The patient agreed with the plan and demonstrated an understanding of the instructions.   The patient was advised to call back or seek an in-person evaluation if the symptoms worsen or if the condition fails to improve as anticipated.    Visit Diagnosis: 1. Macrocytic anemia     Dr. Randa Evens, MD, MPH [facility] at [facility] Tel- [phone] 03/04/2020 3:56 PM

## 2020-04-28 ENCOUNTER — Encounter: Payer: Self-pay | Admitting: Gastroenterology

## 2020-04-28 ENCOUNTER — Other Ambulatory Visit: Payer: Medicaid Other

## 2020-04-28 ENCOUNTER — Ambulatory Visit (INDEPENDENT_AMBULATORY_CARE_PROVIDER_SITE_OTHER): Payer: Medicaid Other | Admitting: Gastroenterology

## 2020-04-28 VITALS — BP 100/78 | HR 103 | Ht 66.0 in | Wt 84.2 lb

## 2020-04-28 DIAGNOSIS — K8020 Calculus of gallbladder without cholecystitis without obstruction: Secondary | ICD-10-CM

## 2020-04-28 DIAGNOSIS — K700 Alcoholic fatty liver: Secondary | ICD-10-CM

## 2020-04-28 DIAGNOSIS — R7989 Other specified abnormal findings of blood chemistry: Secondary | ICD-10-CM

## 2020-04-28 DIAGNOSIS — R945 Abnormal results of liver function studies: Secondary | ICD-10-CM

## 2020-04-28 DIAGNOSIS — K860 Alcohol-induced chronic pancreatitis: Secondary | ICD-10-CM

## 2020-04-28 DIAGNOSIS — F1010 Alcohol abuse, uncomplicated: Secondary | ICD-10-CM

## 2020-04-28 DIAGNOSIS — R634 Abnormal weight loss: Secondary | ICD-10-CM | POA: Diagnosis not present

## 2020-04-28 DIAGNOSIS — R112 Nausea with vomiting, unspecified: Secondary | ICD-10-CM

## 2020-04-28 MED ORDER — PLENVU 140 G PO SOLR
140.0000 g | ORAL | 0 refills | Status: DC
Start: 2020-04-28 — End: 2020-05-06

## 2020-04-28 NOTE — Patient Instructions (Addendum)
If you are age 57 or older, your body mass index should be between 23-30. Your Body mass index is 13.6 kg/m. If this is out of the aforementioned range listed, please consider follow up with your Primary Care Provider.  If you are age 45 or younger, your body mass index should be between 19-25. Your Body mass index is 13.6 kg/m. If this is out of the aformentioned range listed, please consider follow up with your Primary Care Provider.   You have been scheduled for an endoscopy and colonoscopy. Please follow the written instructions given to you at your visit today. Please pick up your prep supplies at the pharmacy within the next 1-3 days. If you use inhalers (even only as needed), please bring them with you on the day of your procedure.  Your provider has requested that you go to the basement level for lab work before leaving today. Press "B" on the elevator. The lab is located at the first door on the left as you exit the elevator.  Due to recent changes in healthcare laws, you may see the results of your imaging and laboratory studies on MyChart before your provider has had a chance to review them.  We understand that in some cases there may be results that are confusing or concerning to you. Not all laboratory results come back in the same time frame and the provider may be waiting for multiple results in order to interpret others.  Please give [place] 48 hours in order for your provider to thoroughly review all the results before contacting the office for clarification of your results.    It was a pleasure to see you today!  Dr. Loletha Carrow

## 2020-04-28 NOTE — Progress Notes (Signed)
[facility] Consult Note:  History: [name] 04/28/2020  Referring provider: Marliss Coots, NP  Reason for consult/chief complaint: Nausea (patient feels sick on her stomach but nothing is coming up ) and Anemia (Patient is here for anemia and has lost weight )   Subjective  HPI:  This is a 59 year old patient accompanied by her sister and referred by hematology for anemia, abdominal pain and weight loss.  Hematology consult note from 02/24/2020 reviewed.  Patient referred for macrocytic anemia with hemoglobin of 10, MCV of 104, platelet 122.  B12 normal.  Patient reportedly had 10 pound weight loss in last 6 months, primarily would consume alcohol (liquor and beer) and water, little food.  Intermittent nausea and vomiting.  She was then referred to [place] for the alcohol dependence, nausea and vomiting and weight loss.  It was noted she had not previously had a colonoscopy.  [name] describes at least a year of nausea with intermittent vomiting, loss of appetite and early satiety.  She does not seem to get abdominal pain as near as I can tell, though she is a somewhat reluctant historian with poor health literacy.  She may have lost as much as 10 pounds in the last 6 months, his weight at 84 pounds in our office today.  She denies rectal bleeding.  She will typically have a BM every 2 or 3 days, which is not unusual for her since she does not eat much.  Regarding her alcohol use, she says "I do not drink much", which turns out to be in about a pint of liquor most days.  She denies dysphagia or odynophagia. ROS:  Review of Systems  Constitutional: Positive for fatigue. Negative for appetite change and unexpected weight change.  HENT: Negative for mouth sores and voice change.   Eyes: Negative for pain and redness.  Respiratory: Negative for cough and shortness of breath.   Cardiovascular: Negative for chest pain and palpitations.  Genitourinary: Negative for  dysuria and hematuria.  Musculoskeletal: Negative for arthralgias and myalgias.  Skin: Negative for pallor and rash.  Neurological: Negative for weakness and headaches.  Hematological: Negative for adenopathy.     Past Medical History: Past Medical History:  Diagnosis Date  . Alcoholic hepatitis   . Alcoholism ([place])   . Anemia   . Breast cancer ([place]) 2001   Rt side. Had lumpectomy, chemo and XRT by [facility]  . Hypertension   . Personal history of chemotherapy   . Personal history of radiation therapy   . Shortness of breath      Past Surgical History: Past Surgical History:  Procedure Laterality Date  . BREAST LUMPECTOMY Right 2001  . rt lumpectomy  2001     Family History: Family History  Problem Relation Age of Onset  . Leukemia Brother   . Lung cancer Maternal Aunt   . Colon cancer Neg Hx   . Stomach cancer Neg Hx   . Pancreatic cancer Neg Hx     Social History: Social History   Socioeconomic History  . Marital status: Single    Spouse name: Not on file  . Number of children: Not on file  . Years of education: Not on file  . Highest education level: Not on file  Occupational History  . Not on file  Tobacco Use  . Smoking status: Former Smoker    Packs/day: 0.50    Types: Cigarettes    Quit date: 11/02/2000    Years since quitting:  19.4  . Smokeless tobacco: Never Used  . Tobacco comment: unknown but before 2002  Vaping Use  . Vaping Use: Never used  Substance and Sexual Activity  . Alcohol use: Yes    Alcohol/week: 42.0 standard drinks    Types: 42 Shots of liquor per week    Comment: -unknown amount pt. drinks beer and liquor  . Drug use: No  . Sexual activity: Not Currently  Other Topics Concern  . Not on file  Social History Narrative  . Not on file   Social Determinants of Health   Financial Resource Strain:   . Difficulty of Paying Living Expenses: Not on file  Food Insecurity:   . Worried About Charity fundraiser in the Last Year: Not  on file  . Ran Out of Food in the Last Year: Not on file  Transportation Needs:   . Lack of Transportation (Medical): Not on file  . Lack of Transportation (Non-Medical): Not on file  Physical Activity:   . Days of Exercise per Week: Not on file  . Minutes of Exercise per Session: Not on file  Stress:   . Feeling of Stress : Not on file  Social Connections:   . Frequency of Communication with Friends and Family: Not on file  . Frequency of Social Gatherings with Friends and Family: Not on file  . Attends Religious Services: Not on file  . Active Member of Clubs or Organizations: Not on file  . Attends Archivist Meetings: Not on file  . Marital Status: Not on file    Allergies: No Known Allergies  Outpatient Meds: Current Outpatient Medications  Medication Sig Dispense Refill  . hydrochlorothiazide (HYDRODIURIL) 25 MG tablet Take 1 tablet (25 mg total) by mouth daily. 90 tablet 2  . lisinopril (PRINIVIL,ZESTRIL) 40 MG tablet Take 1 tablet (40 mg total) by mouth daily. 90 tablet 2  . Vitamin D, Ergocalciferol, (DRISDOL) 1.25 MG (50000 UNIT) CAPS capsule Take 50,000 Units by mouth every 7 (seven) days.    Marland Kitchen PEG-KCl-NaCl-NaSulf-Na Asc-C (PLENVU) 140 g SOLR Take 140 g by mouth as directed. 1 each 0   No current facility-administered medications for this visit.      ___________________________________________________________________ Objective   Exam:  BP 100/78   Pulse (!) 103   Ht 5\' 6"  (1.676 m)   Wt 84 lb 4 oz (38.2 kg)   BMI 13.60 kg/m  Her sister was present for the entire visit.  General: Chronically ill-appearing and emaciated.  Eyes: sclera anicteric, no redness  ENT: oral mucosa moist without lesions, no cervical or supraclavicular lymphadenopathy.  Poor dentition, no loose teeth.  CV: RRR without murmur, S1/S2, no JVD, no peripheral edema  Resp: clear to auscultation bilaterally, normal RR and effort noted  GI: soft, mild epigastric tenderness,  with active bowel sounds. No guarding or palpable organomegaly noted.  Flat belly, no distention or bulging flanks.  Skin; warm and dry, no rash or jaundice noted.  No spider nevi  Neuro: awake, alert and oriented x 3. Normal gross motor function and fluent speech  Labs:  CBC Latest Ref Rng & Units 02/24/2020 12/12/2017 01/23/2016  WBC 4.0 - 10.5 K/uL 7.3 3.9(L) 11.5(H)  Hemoglobin 12.0 - 15.0 g/dL 9.8(L) 11.2(L) 11.2(L)  Hematocrit 36 - 46 % 29.3(L) 30.7(L) 32.8(L)  Platelets 150 - 400 K/uL 331 135(L) 162   CMP Latest Ref Rng & Units 03/03/2020 02/24/2020 12/12/2017  Glucose 70 - 99 mg/dL - 32(LL) 103(H)  BUN 6 -  20 mg/dL - 42(H) 16  Creatinine 0.44 - 1.00 mg/dL 1.90(H) 2.01(H) 1.10(H)  Sodium 135 - 145 mmol/L - 141 140  Potassium 3.5 - 5.1 mmol/L - 4.0 3.5  Chloride 98 - 111 mmol/L - 94(L) 101  CO2 22 - 32 mmol/L - 10(L) 27  Calcium 8.9 - 10.3 mg/dL - 8.8(L) 7.6(L)  Total Protein 6.5 - 8.1 g/dL - 8.3(H) -  Total Bilirubin 0.3 - 1.2 mg/dL - 1.3(H) -  Alkaline Phos 38 - 126 U/L - 124 -  AST 15 - 41 U/L - 131(H) -  ALT 0 - 44 U/L - 35 -   Acute hepatitis panel and HIV testing negative in July 2013.   12/14/2017 EKG (personally reviewed).  Prolonged QTC at 532ms without bundle branch block  Radiologic Studies:  CLINICAL DATA:  Weight loss, macrocytic anemia and history of RIGHT breast cancer   EXAM: CT ABDOMEN AND PELVIS WITH CONTRAST   TECHNIQUE: Multidetector CT imaging of the abdomen and pelvis was performed using the standard protocol following bolus administration of intravenous contrast.   CONTRAST:  25mL OMNIPAQUE IOHEXOL 300 MG/ML  SOLN   COMPARISON:  02/29/2012 the most recent CT comparison. MRI-MRCP from 2013 as well.   FINDINGS: Lower chest: Basilar atelectasis. No consolidation. No pleural effusion. Limited assessment of the lung bases and upper abdomen due to respiratory motion.   Hepatobiliary: Severe hepatic steatosis. No suspicious focal hepatic lesion.  No pericholecystic stranding. Cholelithiasis without biliary duct dilation. Pattern of calcification in the pancreatic head is similar to the prior study. Previous MRCP suggested select suggested raise the question of small distal common bile duct stones. Calcifications in the region of the distal common bile duct and pancreatic head appear to be more centered in the pancreatic duct on today's exam.   Pancreas: Pancreatic atrophy and calcifications in the pancreatic head. No visible mass. Significant change aside from mild increase in atrophy of the pancreas when compared to the previous examination. No peripancreatic stranding.   Spleen: Spleen normal size and contour.   Adrenals/Urinary Tract: Adrenal glands are normal.   No suspicious renal lesion. Small low-density focus in the interpolar RIGHT kidney likely a small cyst. No hydronephrosis. Urinary bladder mildly distended. No perivesical stranding. No perinephric stranding.   Stomach/Bowel: No acute small bowel process. No pericecal stranding. Appendix is normal. Colon is relatively ahaustral with only limited distension but without pericolonic inflammation and is stool filled throughout its course.   Vascular/Lymphatic: No retroperitoneal adenopathy. Vascular structures in the abdomen and pelvis are patent.   No pelvic lymphadenopathy.   Reproductive: No adnexal mass.  Atrophy of the uterus.   Other: Stranding overlying the sacrum with irregularity near the upper margin of the gluteal cleft. No fluid collection. Best seen on image 71 of series 2.   Musculoskeletal: Osteopenia. No acute bone finding or destructive bone process. Sclerosis in the bilateral femoral heads suggestive of mild AVN.   IMPRESSION: 1. Cholelithiasis without definite evidence of cholecystitis and with pattern of calcifications in the pancreatic head unchanged from previous exam. Favor pancreatic ductal calcifications/chronic pancreatitis given  above findings. Calcifications in this region do not appear to be centered within the common bile duct. 2. For above findings would suggest correlation with biliary enzymes to determine whether further evaluation of the biliary tree may be warranted. 3. Stranding overlying the sacrum with irregularity near the upper margin of the gluteal cleft. No fluid collection. Findings are concerning for sacral decubitus ulcer. 4. Severe hepatic steatosis. 5.  Ahaustral appearance of the colon is nonspecific without signs of current inflammation. This may be due to prior inflammation. Appendix is normal. 6. Bilateral femoral AVN 7. Aortic atherosclerosis.   Aortic Atherosclerosis (ICD10-I70.0).     Electronically Signed   By: Zetta Bills M.D.   On: 03/03/2020 13:12   Assessment: Encounter Diagnoses  Name Primary?  . Nausea and vomiting in adult Yes  . Abnormal loss of weight   . Alcohol abuse   . LFTs abnormal   . Alcohol-induced chronic pancreatitis ([place])   . Gallstones   . Alcoholic fatty liver     Nausea with intermittent vomiting, weight loss to the point of severe protein calorie malnutrition in the setting of longstanding alcohol abuse.  I suspect the alcohol abuse, about which she appears to have little insight, most likely explains her digestive symptoms, weight loss and the anemia from marrow suppression.  It also explains her AST predominant transaminitis.  She is also unaware that she has radiographic changes of chronic pancreatitis, though she does not report epigastric pain or diarrhea.  Her gallstones appear to be incidental.  Severe alcohol-related fatty liver on imaging without radiographic or exam evidence of cirrhosis or portal hypertension.  I had a long discussion with both of them about the need for eventual alcohol cessation, but it is not clear if Gursimran has much intention of doing that or the current insight to do so.  Plan: Upper endoscopy and colonoscopy to  rule out malignancy ulcer, or other sources GI symptoms and weight loss. Procedures were described in detail along with risks and benefits and, while she was initially reluctant, after some time alone speaking with her sister about it, she ultimately decided to proceed.   The benefits and risks of the planned procedure were described in detail with the patient or (when appropriate) their health care proxy.  Risks were outlined as including, but not limited to, bleeding, infection, perforation, adverse medication reaction leading to cardiac or pulmonary decompensation, pancreatitis (if ERCP).  The limitation of incomplete mucosal visualization was also discussed.  No guarantees or warranties were given.  Patient at increased risk for cardiopulmonary complications of procedure due to medical comorbidities.   Regarding the elevated LFT, hepatitis B and C serologies as well as HIV antibody were ordered today.   60 minutes were spent on this encounter (including chart review, history/exam, counseling/coordination of care, and documentation)   Thank you for the courtesy of this consult.  Please call me with any questions or concerns.  Nelida Meuse III  CC: Referring provider noted above

## 2020-04-29 LAB — HEPATITIS B SURFACE ANTIBODY,QUALITATIVE: Hep B S Ab: NONREACTIVE

## 2020-04-29 LAB — HEPATITIS B SURFACE ANTIGEN: Hepatitis B Surface Ag: NONREACTIVE

## 2020-04-29 LAB — HIV ANTIBODY (ROUTINE TESTING W REFLEX): HIV 1&2 Ab, 4th Generation: NONREACTIVE

## 2020-04-29 LAB — HEPATITIS C ANTIBODY
Hepatitis C Ab: NONREACTIVE
SIGNAL TO CUT-OFF: 0.1 (ref <1.00)

## 2020-05-06 ENCOUNTER — Encounter: Payer: Self-pay | Admitting: Gastroenterology

## 2020-05-06 ENCOUNTER — Ambulatory Visit (AMBULATORY_SURGERY_CENTER): Payer: Medicaid Other | Admitting: Gastroenterology

## 2020-05-06 ENCOUNTER — Other Ambulatory Visit: Payer: Self-pay

## 2020-05-06 VITALS — BP 153/94 | HR 83 | Temp 97.2°F | Resp 17 | Ht 66.0 in | Wt 84.0 lb

## 2020-05-06 DIAGNOSIS — K3189 Other diseases of stomach and duodenum: Secondary | ICD-10-CM

## 2020-05-06 DIAGNOSIS — K2980 Duodenitis without bleeding: Secondary | ICD-10-CM

## 2020-05-06 DIAGNOSIS — D122 Benign neoplasm of ascending colon: Secondary | ICD-10-CM

## 2020-05-06 DIAGNOSIS — K2970 Gastritis, unspecified, without bleeding: Secondary | ICD-10-CM | POA: Diagnosis not present

## 2020-05-06 DIAGNOSIS — K222 Esophageal obstruction: Secondary | ICD-10-CM

## 2020-05-06 DIAGNOSIS — R112 Nausea with vomiting, unspecified: Secondary | ICD-10-CM

## 2020-05-06 DIAGNOSIS — R634 Abnormal weight loss: Secondary | ICD-10-CM

## 2020-05-06 DIAGNOSIS — K2950 Unspecified chronic gastritis without bleeding: Secondary | ICD-10-CM | POA: Diagnosis not present

## 2020-05-06 MED ORDER — SODIUM CHLORIDE 0.9 % IV SOLN
500.0000 mL | Freq: Once | INTRAVENOUS | Status: DC
Start: 2020-05-06 — End: 2020-05-06

## 2020-05-06 NOTE — Progress Notes (Signed)
PT taken to PACU. Monitors in place. VSS. Report given to RN. 

## 2020-05-06 NOTE — Op Note (Signed)
[facility] Patient Name: [name] Procedure Date: 05/06/2020 9:18 AM MRN: [id number] Endoscopist: Mallie Mussel L. Loletha Carrow , MD Age: 59 Referring MD:  Date of Birth: 23-Dec-1960 Gender: Female Account #: [id number] Procedure:                Upper GI endoscopy Indications:              Nausea with vomiting, Weight loss (in setting of                            alcohol abuse) Medicines:                Monitored Anesthesia Care Procedure:                Pre-Anesthesia Assessment:                           - Prior to the procedure, a History and Physical                            was performed, and patient medications and                            allergies were reviewed. The patient's tolerance of                            previous anesthesia was also reviewed. The risks                            and benefits of the procedure and the sedation                            options and risks were discussed with the patient.                            All questions were answered, and informed consent                            was obtained. Prior Anticoagulants: The patient has                            taken no previous anticoagulant or antiplatelet                            agents. ASA Grade Assessment: III - A patient with                            severe systemic disease. After reviewing the risks                            and benefits, the patient was deemed in                            satisfactory condition to undergo the procedure.  After obtaining informed consent, the endoscope was                            passed under direct vision. Throughout the                            procedure, the patient's blood pressure, pulse, and                            oxygen saturations were monitored continuously. The                            Endoscope was introduced through the mouth, and                            advanced to the second part of duodenum.  The upper                            GI endoscopy was accomplished without difficulty.                            The patient tolerated the procedure well. Scope In: Scope Out: Findings:                 One benign-appearing, intrinsic moderate stenosis                            was found in the distal esophagus. This stenosis                            measured 9 mm (inner diameter) x 1 cm (in length) -                            scope able to pass with slow steady pressure. The                            stenosis was traversed. A TTS dilator was passed                            through the scope. Dilation with a 13.5-14.5-15.5                            mm balloon dilator was performed to 14.5 mm. The                            dilation site was examined and showed moderate                            mucosal disruption. Biopsies were taken with a cold                            forceps for histology.  Diffuse atrophic mucosa was found in the entire                            examined stomach.                           Localized mild inflammation characterized by                            congestion (edema) and erosions was found in the                            prepyloric region of the stomach. Several biopsies                            were obtained in the gastric antrum with cold                            forceps for histology.                           The cardia and gastric fundus were normal on                            retroflexion.                           Diffuse congested mucosa without active bleeding                            and with no stigmata of bleeding was found in the                            entire duodenum. Biopsies were taken with a cold                            forceps for histology. Complications:            No immediate complications. Estimated Blood Loss:     Estimated blood loss was minimal. Impression:               -  Benign-appearing esophageal stenosis. Dilated.                            Biopsied.                           - Gastric mucosal atrophy.                           - Gastritis.                           - Congested duodenal mucosa. Biopsied.                           - Several biopsies were obtained in the gastric  antrum. Recommendation:           - Patient has a contact number available for                            emergencies. The signs and symptoms of potential                            delayed complications were discussed with the                            patient. Return to normal activities tomorrow.                            Written discharge instructions were provided to the                            patient.                           - Resume previous diet.                           - Continue present medications.                           - Await pathology results.                           - See the other procedure note for documentation of                            additional recommendations. Ebon Ketchum L. Loletha Carrow, MD 05/06/2020 10:12:52 AM This report has been signed electronically.

## 2020-05-06 NOTE — Op Note (Signed)
[facility] Patient Name: [name] Procedure Date: 05/06/2020 9:18 AM MRN: [id number] Endoscopist: Mallie Mussel L. Loletha Carrow , MD Age: 59 Referring MD:  Date of Birth: Sep 19, 1960 Gender: Female Account #: [id number] Procedure:                Colonoscopy Indications:              Weight loss Medicines:                Monitored Anesthesia Care Procedure:                Pre-Anesthesia Assessment:                           - Prior to the procedure, a History and Physical                            was performed, and patient medications and                            allergies were reviewed. The patient's tolerance of                            previous anesthesia was also reviewed. The risks                            and benefits of the procedure and the sedation                            options and risks were discussed with the patient.                            All questions were answered, and informed consent                            was obtained. Prior Anticoagulants: The patient has                            taken no previous anticoagulant or antiplatelet                            agents. ASA Grade Assessment: III - A patient with                            severe systemic disease. After reviewing the risks                            and benefits, the patient was deemed in                            satisfactory condition to undergo the procedure.                           After obtaining informed consent, the colonoscope  was passed under direct vision. Throughout the                            procedure, the patient's blood pressure, pulse, and                            oxygen saturations were monitored continuously. The                            Colonoscope was introduced through the anus and                            advanced to the the cecum, identified by                            appendiceal orifice and ileocecal valve. The                             colonoscopy was performed without difficulty. The                            patient tolerated the procedure well. The quality                            of the bowel preparation was poor. The ileocecal                            valve, appendiceal orifice, and rectum were                            photographed. The bowel preparation used was Plenvu. Scope In: 9:49:07 AM Scope Out: 10:06:20 AM Scope Withdrawal Time: 0 hours 11 minutes 9 seconds  Total Procedure Duration: 0 hours 17 minutes 13 seconds  Findings:                 The perianal and digital rectal examinations were                            normal.                           Multiple diverticula were found in the right colon.                           A diminutive polyp was found in the ascending                            colon. The polyp was sessile. The polyp was removed                            with a cold snare. Resection and retrieval were                            complete.  A diminutive polyp was found in the ascending                            colon. The polyp was sessile and partially inside a                            small diverticulum. The polyp was removed with a                            cold biopsy forceps. Resection and retrieval were                            complete.                           A large amount of semi-solid stool was found in the                            entire colon, interfering with visualization.                            Lavage of the area was performed using a large                            amount, resulting in incomplete clearance with                            continued poor visualization.                           The exam was otherwise without abnormality on                            direct and retroflexion views. Complications:            No immediate complications. Estimated Blood Loss:     Estimated blood loss was  minimal. Impression:               - Preparation of the colon was poor.                           - Diverticulosis in the right colon.                           - One diminutive polyp in the ascending colon,                            removed with a cold snare. Resected and retrieved.                           - One diminutive polyp in the ascending colon,                            removed with a cold biopsy forceps. Resected and  retrieved.                           - Stool in the entire examined colon.                           - The examination was otherwise normal on direct                            and retroflexion views.                           No cause for weight loss seen despite limited                            visualization from poor prep. Recommendation:           - Patient has a contact number available for                            emergencies. The signs and symptoms of potential                            delayed complications were discussed with the                            patient. Return to normal activities tomorrow.                            Written discharge instructions were provided to the                            patient.                           - Resume previous diet.                           - Continue present medications.                           - Await pathology results.                           - Repeat colonoscopy in 1 year for surveillance. At                            that time, patient would have to be willing to take                            sufficient bowel preparation.                           - Stop alcohol use Keslie Gritz L. Loletha Carrow, MD 05/06/2020 10:18:19 AM This report has been signed electronically.

## 2020-05-06 NOTE — Progress Notes (Signed)
Called to room to assist during endoscopic procedure.  Patient ID and intended procedure confirmed with present staff. Received instructions for my participation in the procedure from the performing physician.  

## 2020-05-06 NOTE — Patient Instructions (Signed)
Please read handouts provided. Continue present medications. Resume previous diet. Await pathology results. Recommending to repeat colonoscopy in 1 year. Stop alcohol use.       YOU HAD AN ENDOSCOPIC PROCEDURE TODAY AT [facility]:   Refer to the procedure report that was given to you for any specific questions about what was found during the examination.  If the procedure report does not answer your questions, please call your gastroenterologist to clarify.  If you requested that your care partner not be given the details of your procedure findings, then the procedure report has been included in a sealed envelope for you to review at your convenience later.  YOU SHOULD EXPECT: Some feelings of bloating in the abdomen. Passage of more gas than usual.  Walking can help get rid of the air that was put into your GI tract during the procedure and reduce the bloating. If you had a lower endoscopy (such as a colonoscopy or flexible sigmoidoscopy) you may notice spotting of blood in your stool or on the toilet paper. If you underwent a bowel prep for your procedure, you may not have a normal bowel movement for a few days.  Please Note:  You might notice some irritation and congestion in your nose or some drainage.  This is from the oxygen used during your procedure.  There is no need for concern and it should clear up in a day or so.  SYMPTOMS TO REPORT IMMEDIATELY:   Following lower endoscopy (colonoscopy or flexible sigmoidoscopy):  Excessive amounts of blood in the stool  Significant tenderness or worsening of abdominal pains  Swelling of the abdomen that is new, acute  Fever of 100F or higher   Following upper endoscopy (EGD)  Vomiting of blood or coffee ground material  New chest pain or pain under the shoulder blades  Painful or persistently difficult swallowing  New shortness of breath  Fever of 100F or higher  Black, tarry-looking stools  For urgent or emergent  issues, a gastroenterologist can be reached at any hour by calling [phone]. Do not use MyChart messaging for urgent concerns.    DIET:  We do recommend a small meal at first, but then you may proceed to your regular diet.  Drink plenty of fluids but you should avoid alcoholic beverages for 24 hours.  ACTIVITY:  You should plan to take it easy for the rest of today and you should NOT DRIVE or use heavy machinery until tomorrow (because of the sedation medicines used during the test).    FOLLOW UP: Our staff will call the number listed on your records 48-72 hours following your procedure to check on you and address any questions or concerns that you may have regarding the information given to you following your procedure. If we do not reach you, we will leave a message.  We will attempt to reach you two times.  During this call, we will ask if you have developed any symptoms of COVID 19. If you develop any symptoms (ie: fever, flu-like symptoms, shortness of breath, cough etc.) before then, please call [phone].  If you test positive for Covid 19 in the 2 weeks post procedure, please call and report this information to [place].    If any biopsies were taken you will be contacted by phone or by letter within the next 1-3 weeks.  Please call us at [phone] if you have not heard about the biopsies in 3 weeks.    SIGNATURES/CONFIDENTIALITY: You  and/or your care partner have signed paperwork which will be entered into your electronic medical record.  These signatures attest to the fact that that the information above on your After Visit Summary has been reviewed and is understood.  Full responsibility of the confidentiality of this discharge information lies with you and/or your care-partner.

## 2020-05-10 ENCOUNTER — Telehealth: Payer: Self-pay | Admitting: *Deleted

## 2020-05-10 NOTE — Telephone Encounter (Signed)
  Follow up Call-  Call back number 05/06/2020  Post procedure Call Back phone  # [phone] sister  Permission to leave phone message Yes  Some recent data might be hidden     Patient questions:  Do you have a fever, pain , or abdominal swelling? No. Pain Score  0 *  Have you tolerated food without any problems? Yes.    Have you been able to return to your normal activities? Yes.    Do you have any questions about your discharge instructions: Diet   No. Medications  No. Follow up visit  No.  Do you have questions or concerns about your Care? No.  Actions: * If pain score is 4 or above: No action needed, pain <4  1. Have you developed a fever since your procedure? NO  2.   Have you had an respiratory symptoms (SOB or cough) since your procedure? NO  3.   Have you tested positive for COVID 19 since your procedure NO  4.   Have you had any family members/close contacts diagnosed with the COVID 19 since your procedure?  NO   If yes to any of these questions please route to Joylene John, RN and Joella Prince, RN

## 2020-05-10 NOTE — Telephone Encounter (Signed)
Message left

## 2020-05-13 ENCOUNTER — Encounter: Payer: Self-pay | Admitting: Gastroenterology

## 2020-05-20 ENCOUNTER — Other Ambulatory Visit: Payer: Self-pay

## 2020-05-20 ENCOUNTER — Inpatient Hospital Stay
Admission: EM | Admit: 2020-05-20 | Discharge: 2020-05-22 | DRG: 641 | Disposition: A | Discharge status: Home / Self Care | Payer: Medicaid Other | Attending: Internal Medicine | Admitting: Internal Medicine

## 2020-05-20 DIAGNOSIS — K2950 Unspecified chronic gastritis without bleeding: Secondary | ICD-10-CM | POA: Diagnosis present

## 2020-05-20 DIAGNOSIS — T502X5A Adverse effect of carbonic-anhydrase inhibitors, benzothiadiazides and other diuretics, initial encounter: Secondary | ICD-10-CM | POA: Diagnosis present

## 2020-05-20 DIAGNOSIS — D509 Iron deficiency anemia, unspecified: Secondary | ICD-10-CM | POA: Diagnosis present

## 2020-05-20 DIAGNOSIS — Z9221 Personal history of antineoplastic chemotherapy: Secondary | ICD-10-CM

## 2020-05-20 DIAGNOSIS — E876 Hypokalemia: Secondary | ICD-10-CM | POA: Diagnosis present

## 2020-05-20 DIAGNOSIS — K760 Fatty (change of) liver, not elsewhere classified: Secondary | ICD-10-CM | POA: Diagnosis present

## 2020-05-20 DIAGNOSIS — R531 Weakness: Secondary | ICD-10-CM

## 2020-05-20 DIAGNOSIS — Z681 Body mass index (BMI) 19 or less, adult: Secondary | ICD-10-CM

## 2020-05-20 DIAGNOSIS — R64 Cachexia: Secondary | ICD-10-CM | POA: Diagnosis present

## 2020-05-20 DIAGNOSIS — Z853 Personal history of malignant neoplasm of breast: Secondary | ICD-10-CM

## 2020-05-20 DIAGNOSIS — Z87891 Personal history of nicotine dependence: Secondary | ICD-10-CM | POA: Diagnosis not present

## 2020-05-20 DIAGNOSIS — Z20822 Contact with and (suspected) exposure to covid-19: Secondary | ICD-10-CM | POA: Diagnosis present

## 2020-05-20 DIAGNOSIS — Z79899 Other long term (current) drug therapy: Secondary | ICD-10-CM | POA: Diagnosis not present

## 2020-05-20 DIAGNOSIS — R251 Tremor, unspecified: Secondary | ICD-10-CM | POA: Diagnosis present

## 2020-05-20 DIAGNOSIS — R636 Underweight: Secondary | ICD-10-CM | POA: Diagnosis present

## 2020-05-20 DIAGNOSIS — F1020 Alcohol dependence, uncomplicated: Secondary | ICD-10-CM | POA: Diagnosis present

## 2020-05-20 DIAGNOSIS — D649 Anemia, unspecified: Secondary | ICD-10-CM

## 2020-05-20 DIAGNOSIS — I10 Essential (primary) hypertension: Secondary | ICD-10-CM | POA: Diagnosis present

## 2020-05-20 DIAGNOSIS — Z806 Family history of leukemia: Secondary | ICD-10-CM

## 2020-05-20 DIAGNOSIS — D539 Nutritional anemia, unspecified: Secondary | ICD-10-CM | POA: Diagnosis present

## 2020-05-20 DIAGNOSIS — Z923 Personal history of irradiation: Secondary | ICD-10-CM | POA: Diagnosis not present

## 2020-05-20 DIAGNOSIS — Z862 Personal history of diseases of the blood and blood-forming organs and certain disorders involving the immune mechanism: Secondary | ICD-10-CM

## 2020-05-20 DIAGNOSIS — D638 Anemia in other chronic diseases classified elsewhere: Secondary | ICD-10-CM | POA: Diagnosis present

## 2020-05-20 DIAGNOSIS — K860 Alcohol-induced chronic pancreatitis: Secondary | ICD-10-CM | POA: Diagnosis present

## 2020-05-20 DIAGNOSIS — E46 Unspecified protein-calorie malnutrition: Secondary | ICD-10-CM

## 2020-05-20 LAB — BASIC METABOLIC PANEL
Anion gap: 19 — ABNORMAL HIGH (ref 5–15)
BUN: 20 mg/dL (ref 6–20)
CO2: 25 mmol/L (ref 22–32)
Calcium: 7.5 mg/dL — ABNORMAL LOW (ref 8.9–10.3)
Chloride: 104 mmol/L (ref 98–111)
Creatinine, Ser: 1.06 mg/dL — ABNORMAL HIGH (ref 0.44–1.00)
GFR calc non Af Amer: 58 mL/min — ABNORMAL LOW (ref >60)
Glucose, Bld: 95 mg/dL (ref 70–99)
Potassium: <2 mmol/L — CL (ref 3.5–5.1)
Sodium: 148 mmol/L — ABNORMAL HIGH (ref 135–145)

## 2020-05-20 LAB — VITAMIN B12: Vitamin B-12: 261 pg/mL (ref 180–914)

## 2020-05-20 LAB — FOLATE: Folate: 4.7 ng/mL — ABNORMAL LOW (ref >5.9)

## 2020-05-20 LAB — RESP PANEL BY RT PCR (RSV, FLU A&B, COVID)
Influenza A by PCR: NEGATIVE
Influenza B by PCR: NEGATIVE
Respiratory Syncytial Virus by PCR: NEGATIVE
SARS Coronavirus 2 by RT PCR: NEGATIVE

## 2020-05-20 LAB — RETICULOCYTES
Immature Retic Fract: 26.4 % — ABNORMAL HIGH (ref 2.3–15.9)
RBC.: 1.61 MIL/uL — ABNORMAL LOW (ref 3.87–5.11)
Retic Count, Absolute: 38.3 10*3/uL (ref 19.0–186.0)
Retic Ct Pct: 2.4 % (ref 0.4–3.1)

## 2020-05-20 LAB — CBC
HCT: 17.9 % — ABNORMAL LOW (ref 36.0–46.0)
Hemoglobin: 6.6 g/dL — ABNORMAL LOW (ref 12.0–15.0)
MCH: 40 pg — ABNORMAL HIGH (ref 26.0–34.0)
MCHC: 36.9 g/dL — ABNORMAL HIGH (ref 30.0–36.0)
MCV: 108.5 fL — ABNORMAL HIGH (ref 80.0–100.0)
Platelets: 388 10*3/uL (ref 150–400)
RBC: 1.65 MIL/uL — ABNORMAL LOW (ref 3.87–5.11)
RDW: 15.6 % — ABNORMAL HIGH (ref 11.5–15.5)
WBC: 5.2 10*3/uL (ref 4.0–10.5)
nRBC: 0.8 % — ABNORMAL HIGH (ref 0.0–0.2)

## 2020-05-20 LAB — IRON AND TIBC
Iron: 74 ug/dL (ref 28–170)
Saturation Ratios: 41 % — ABNORMAL HIGH (ref 10.4–31.8)
TIBC: 179 ug/dL — ABNORMAL LOW (ref 250–450)
UIBC: 105 ug/dL

## 2020-05-20 LAB — MAGNESIUM: Magnesium: 1 mg/dL — ABNORMAL LOW (ref 1.7–2.4)

## 2020-05-20 LAB — FERRITIN: Ferritin: 174 ng/mL (ref 11–307)

## 2020-05-20 MED ORDER — SODIUM CHLORIDE 0.9 % IV SOLN
1.0000 mg | Freq: Once | INTRAVENOUS | Status: AC
  Administered 2020-05-20: 1 mg via INTRAVENOUS
  Filled 2020-05-20: qty 0.2

## 2020-05-20 MED ORDER — ACETAMINOPHEN 650 MG RE SUPP: 650.0000 mg | Freq: Four times a day (QID) | RECTAL | Status: DC | PRN

## 2020-05-20 MED ORDER — LACTATED RINGERS IV SOLN: INTRAVENOUS | Status: DC

## 2020-05-20 MED ORDER — ONDANSETRON HCL 4 MG PO TABS: 4.0000 mg | ORAL_TABLET | Freq: Four times a day (QID) | ORAL | Status: DC | PRN

## 2020-05-20 MED ORDER — POTASSIUM CHLORIDE CRYS ER 20 MEQ PO TBCR
40.0000 meq | EXTENDED_RELEASE_TABLET | Freq: Once | ORAL | Status: AC
  Administered 2020-05-20: 40 meq via ORAL
  Filled 2020-05-20: qty 2

## 2020-05-20 MED ORDER — FOLIC ACID 1 MG PO TABS
1.0000 mg | ORAL_TABLET | Freq: Every day | ORAL | Status: DC
  Administered 2020-05-20 – 2020-05-22 (×3): 1 mg via ORAL
  Filled 2020-05-20 (×3): qty 1

## 2020-05-20 MED ORDER — LORAZEPAM 2 MG/ML IJ SOLN: 1.0000 mg | INTRAMUSCULAR | Status: DC | PRN

## 2020-05-20 MED ORDER — PANTOPRAZOLE SODIUM 40 MG IV SOLR
40.0000 mg | INTRAVENOUS | Status: DC
  Administered 2020-05-20: 40 mg via INTRAVENOUS
  Filled 2020-05-20: qty 40

## 2020-05-20 MED ORDER — LORAZEPAM 2 MG/ML IJ SOLN: 0.0000 mg | Freq: Four times a day (QID) | INTRAMUSCULAR | Status: DC

## 2020-05-20 MED ORDER — ADULT MULTIVITAMIN W/MINERALS CH
1.0000 | ORAL_TABLET | Freq: Every day | ORAL | Status: DC
  Administered 2020-05-20 – 2020-05-22 (×3): 1 via ORAL
  Filled 2020-05-20 (×3): qty 1

## 2020-05-20 MED ORDER — LORAZEPAM 1 MG PO TABS: 1.0000 mg | ORAL_TABLET | ORAL | Status: DC | PRN

## 2020-05-20 MED ORDER — POTASSIUM CHLORIDE 10 MEQ/100ML IV SOLN
10.0000 meq | INTRAVENOUS | Status: AC
  Administered 2020-05-20 (×3): 10 meq via INTRAVENOUS
  Filled 2020-05-20 (×3): qty 100

## 2020-05-20 MED ORDER — SODIUM CHLORIDE 0.9 % IV SOLN
10.0000 mL/h | Freq: Once | INTRAVENOUS | Status: AC
  Administered 2020-05-20: 10 mL/h via INTRAVENOUS

## 2020-05-20 MED ORDER — LORAZEPAM 2 MG/ML IJ SOLN: 0.0000 mg | Freq: Two times a day (BID) | INTRAMUSCULAR | Status: DC

## 2020-05-20 MED ORDER — PANTOPRAZOLE SODIUM 40 MG IV SOLR
40.0000 mg | Freq: Two times a day (BID) | INTRAVENOUS | Status: DC
  Administered 2020-05-21 (×2): 40 mg via INTRAVENOUS
  Filled 2020-05-20 (×2): qty 40

## 2020-05-20 MED ORDER — MAGNESIUM SULFATE 2 GM/50ML IV SOLN
2.0000 g | Freq: Once | INTRAVENOUS | Status: AC
  Administered 2020-05-20: 2 g via INTRAVENOUS
  Filled 2020-05-20: qty 50

## 2020-05-20 MED ORDER — THIAMINE HCL 100 MG/ML IJ SOLN
100.0000 mg | Freq: Once | INTRAMUSCULAR | Status: AC
  Administered 2020-05-20: 100 mg via INTRAVENOUS
  Filled 2020-05-20: qty 2

## 2020-05-20 MED ORDER — ACETAMINOPHEN 325 MG PO TABS: 650.0000 mg | ORAL_TABLET | Freq: Four times a day (QID) | ORAL | Status: DC | PRN

## 2020-05-20 MED ORDER — ONDANSETRON HCL 4 MG/2ML IJ SOLN: 4.0000 mg | Freq: Four times a day (QID) | INTRAMUSCULAR | Status: DC | PRN

## 2020-05-20 MED ORDER — THIAMINE HCL 100 MG PO TABS
100.0000 mg | ORAL_TABLET | Freq: Every day | ORAL | Status: DC
  Administered 2020-05-20 – 2020-05-22 (×3): 100 mg via ORAL
  Filled 2020-05-20 (×3): qty 1

## 2020-05-20 NOTE — ED Notes (Addendum)
Pt assisted to bedside toilet for BM. Post assisted pt back to bed

## 2020-05-20 NOTE — H&P (Signed)
History and Physical    Jenny SCOTTO UYQ:[id number] DOB: [birth year] DOA: 05/20/2020  PCP: Marliss Coots, NP   Patient coming from: Home  I have personally briefly reviewed patient's old medical records in [place]  Chief Complaint: Weakness, abnormal labs  HPI: [name] is a 59 y.o. female with medical history significant for hypertension, breast cancer status post chemo and XRT, and alcohol use disorder, as well as alcohol related microcytic anemia seen by hematology, Dr. Janese Banks in July 2021 who presented to the emergency room on the advice of her PCP, after outpatient labs done on 9/15 showed hypokalemia and anemia.  Patient had seen her PCP back on 9/15 with complaints of nausea, intermittent vomiting, loss of appetite and early satiety as well as weight loss of at least 10 pounds that has been going on for the past year.  Patient drinks about a pint of liquor most days.  She denies coffee-ground or blood in her emesis, denies black or bloody.  Plan during her recent appointment was for referral for endoscopy and colonoscopy to evaluate for GI etiology. ED Course: On arrival, vitals were within normal limits.  Blood work notable for potassium less than 2, magnesium of 1 and hemoglobin of 6.6, down from 9.82 months prior.  Platelets normal. EKG as reviewed by me : NSR at 82 with no acute ST-T wave changes. Patient started on potassium and magnesium IV as well as thiamine.  Hospitalist consulted for admission.  Review of Systems: As per HPI otherwise all other systems on review of systems negative.    Past Medical History:  Diagnosis Date  . Alcoholic hepatitis   . Alcoholism ([place])   . Anemia   . Breast cancer ([place]) 2001   Rt side. Had lumpectomy, chemo and XRT by [facility]  . Hypertension   . Personal history of chemotherapy   . Personal history of radiation therapy   . Shortness of breath     Past Surgical History:  Procedure Laterality Date  . BREAST LUMPECTOMY  Right 2001  . rt lumpectomy  2001     reports that she quit smoking about 19 years ago. Her smoking use included cigarettes. She smoked 0.50 packs per day. She has never used smokeless tobacco. She reports current alcohol use of about 42.0 standard drinks of alcohol per week. She reports that she does not use drugs.  No Known Allergies  Family History  Problem Relation Age of Onset  . Leukemia Brother   . Lung cancer Maternal Aunt   . Colon cancer Neg Hx   . Stomach cancer Neg Hx   . Pancreatic cancer Neg Hx       Prior to Admission medications   Medication Sig Start Date End Date Taking? Authorizing Provider  hydrochlorothiazide (HYDRODIURIL) 25 MG tablet Take 1 tablet (25 mg total) by mouth daily. 01/04/17   Verner Mould, MD  lisinopril (PRINIVIL,ZESTRIL) 40 MG tablet Take 1 tablet (40 mg total) by mouth daily. 01/04/17   Verner Mould, MD  Vitamin D, Ergocalciferol, (DRISDOL) 1.25 MG (50000 UNIT) CAPS capsule Take 50,000 Units by mouth every 7 (seven) days.    [provider]    Physical Exam: Vitals:   05/20/20 1739 05/20/20 1740 05/20/20 1900  BP: 111/78  (!) 162/85  Pulse: 81  81  Resp: 20  15  Temp: 98.4 F (36.9 C)    TempSrc: Oral    SpO2: 100%  99%  Weight:  38.1 kg  Height:  5\' 7"  (1.702 m)      Vitals:   05/20/20 1739 05/20/20 1740 05/20/20 1900  BP: 111/78  (!) 162/85  Pulse: 81  81  Resp: 20  15  Temp: 98.4 F (36.9 C)    TempSrc: Oral    SpO2: 100%  99%  Weight:  38.1 kg   Height:  5\' 7"  (1.702 m)       Constitutional:  Appears malnourished and cachectic.  Alert and oriented x 3 . Not in any apparent distress HEENT:      Head: Normocephalic and atraumatic.         Eyes: PERLA, EOMI, Conjunctivae are normal. Sclera is non-icteric.       Mouth/Throat: Mucous membranes are moist.       Neck: Supple with no signs of meningismus. Cardiovascular: Regular rate and rhythm. No murmurs, gallops, or rubs. 2+ symmetrical  distal pulses are present . No JVD. No LE edema Respiratory: Respiratory effort normal .Lungs sounds clear bilaterally. No wheezes, crackles, or rhonchi.  Gastrointestinal: Soft, non tender, and non distended with positive bowel sounds. No rebound or guarding. Genitourinary: No CVA tenderness. Musculoskeletal: Nontender with normal range of motion in all extremities. No cyanosis, or erythema of extremities. Neurologic:  Face is symmetric. Moving all extremities. No gross focal neurologic deficits . Skin: Skin is warm, dry.  No rash or ulcers Psychiatric: Mood and affect are normal    Labs on Admission: I have personally reviewed following labs and imaging studies  CBC: Recent Labs  Lab 05/20/20 1745  WBC 5.2  HGB 6.6*  HCT 17.9*  MCV 108.5*  PLT 785   Basic Metabolic Panel: Recent Labs  Lab 05/20/20 1745  NA 148*  K <2.0*  CL 104  CO2 25  GLUCOSE 95  BUN 20  CREATININE 1.06*  CALCIUM 7.5*  MG 1.0*   GFR: Estimated Creatinine Clearance: 35.2 mL/min (A) (by C-G formula based on SCr of 1.06 mg/dL (H)). Liver Function Tests: No results for input(s): AST, ALT, ALKPHOS, BILITOT, PROT, ALBUMIN in the last 168 hours. No results for input(s): LIPASE, AMYLASE in the last 168 hours. No results for input(s): AMMONIA in the last 168 hours. Coagulation Profile: No results for input(s): INR, PROTIME in the last 168 hours. Cardiac Enzymes: No results for input(s): CKTOTAL, CKMB, CKMBINDEX, TROPONINI in the last 168 hours. BNP (last 3 results) No results for input(s): PROBNP in the last 8760 hours. HbA1C: No results for input(s): HGBA1C in the last 72 hours. CBG: No results for input(s): GLUCAP in the last 168 hours. Lipid Profile: No results for input(s): CHOL, HDL, LDLCALC, TRIG, CHOLHDL, LDLDIRECT in the last 72 hours. Thyroid Function Tests: No results for input(s): TSH, T4TOTAL, FREET4, T3FREE, THYROIDAB in the last 72 hours. Anemia Panel: No results for input(s):  VITAMINB12, FOLATE, FERRITIN, TIBC, IRON, RETICCTPCT in the last 72 hours. Urine analysis:    Component Value Date/Time   COLORURINE AMBER (A) 12/12/2017 1541   APPEARANCEUR HAZY (A) 12/12/2017 1541   LABSPEC 1.022 12/12/2017 1541   PHURINE 5.0 12/12/2017 1541   GLUCOSEU NEGATIVE 12/12/2017 1541   HGBUR MODERATE (A) 12/12/2017 1541   BILIRUBINUR MODERATE (A) 12/12/2017 1541   KETONESUR 5 (A) 12/12/2017 1541   PROTEINUR >=300 (A) 12/12/2017 1541   UROBILINOGEN 0.2 03/13/2012 1104   NITRITE POSITIVE (A) 12/12/2017 1541   LEUKOCYTESUR MODERATE (A) 12/12/2017 1541    Radiological Exams on Admission: No results found.   Assessment/Plan 59 year old female with history of  hypertension, breast cancer status post chemo and XRT, and alcohol use disorder, as well as alcohol related microcytic anemia seen by hematology, Dr. Janese Banks in July 2021 with hemoglobin 6.6 who presented to the emergency room on the advice of her PCP, after outpatient labs done on 9/15 showed hypokalemia and anemia.  Patient symptomatic for weakness, weight loss and nausea.    Generalized weakness -Suspect multifactorial.  Patient has had weight loss for the past year, nausea with intermittent vomiting. -Differentials include symptomatic anemia, underlying malignancy, malnutrition related to chronic alcohol use, electrolyte abnormalities -Treat  etiologies as outlined below    Symptomatic anemia with history of macrocytic anemia -Patient had a full hematology work-up in July by Dr. Janese Banks with final diagnosis microcytic anemia related to alcohol use -Hemoglobin 6.6, down from 9.8 when patient was evaluated by hematology -Being transfused 1 unit in the ER -Monitor H&H posttransfusion -Follow-up stool for occult blood and anemia panel -GI consulted .   Hypomagnesemia -Given 2 g IV magnesium in the ER.  Continue to monitor  Severe hypokalemia -Potassium less than 2, possibly in part related to thiazide diuretic -Receiving  oral and IV potassium repletion -Monitor and replete as needed -Continuous cardiac monitoring    Alcohol dependence ([place]), moderate -Patient drinks a pint of liquor daily -Thiamine folate and multivitamin.  IV thiamine given in the ER -CIWA withdrawal protocol  Persistent nausea Decreased oral intake, decreased appetite, weight loss -Consider GI consult to evaluate for upper and lower endoscopy in view of severe anemia -Protonix  HYPERTENSION, BENIGN SYSTEMIC -Patient is normotensive so we will hold her home hydrochlorothiazide and lisinopril for now    Protein calorie malnutrition ([place]), suspect severe -Likely related to chronic alcohol use, chronic nausea, -Nutritionist consult -Patient with weight loss over the past several months, with BMI of 13    History of breast cancer -Status post chemo and radiation.  No acute disease suspected at this time    DVT prophylaxis: SCDs given severe anemia code Status: full code  Family Communication:  none  Disposition Plan: Back to previous home environment Consults called: GI Status:At the time of admission, it appears that the appropriate admission status for this patient is INPATIENT. This is judged to be reasonable and necessary in order to provide the required intensity of service to ensure the patient's safety given the presenting symptoms, physical exam findings, and initial radiographic and laboratory data in the context of their  Comorbid conditions.   Patient requires inpatient status due to high intensity of service, high risk for further deterioration and high frequency of surveillance required.   I certify that at the point of admission it is my clinical judgment that the patient will require inpatient hospital care spanning beyond [place] MD [facility]     05/20/2020, 8:43 PM

## 2020-05-20 NOTE — ED Provider Notes (Addendum)
[facility] Emergency Department Provider Note  ____________________________________________   First MD Initiated Contact with Patient 05/20/20 1839     (approximate)  I have reviewed the triage vital signs and the nursing notes.   HISTORY  Chief Complaint Abnormal Lab    HPI [name] is a 59 y.o. female with history of alcoholism here with abnormal labs.  The patient arrives with complaint of abnormal labs.  She had lab work drawn at her PCPs approximately a week ago and this returned with low potassium and blood level.  She reportedly has been not answering her phone and so they actually sent a letter, which patient received and so she presents for evaluation today.  She notes generalized weakness and fatigue.  She states her legs have been giving out due to weakness and cramping.  She denies any active bleeding.  Does not recall any blood in her stools.  She does note that she gets frequent nausea and vomiting and will go several days without eating due to this nausea.  She does admit to fairly regular alcohol use.  No other complaints.  No fevers or chills.  No pain.        Past Medical History:  Diagnosis Date  . Alcoholic hepatitis   . Alcoholism ([place])   . Anemia   . Breast cancer ([place]) 2001   Rt side. Had lumpectomy, chemo and XRT by [facility]  . Hypertension   . Personal history of chemotherapy   . Personal history of radiation therapy   . Shortness of breath     Patient Active Problem List   Diagnosis Date Noted  . Cough 06/30/2012  . Heme + stool 03/15/2012  . Anemia 03/15/2012  . Necrotizing pneumonia ([place]) 03/06/2012  . Cholelithiasis 03/01/2012  . Abnormal finding on GI tract imaging 03/01/2012  . Occasional numbness/prickling/tingling of fingers and toes 12/15/2011  . Hypokalemia 12/15/2011  . Hypomagnesemia 08/06/2011  . Unspecified episodic mood disorder 07/24/2011  . Tobacco abuse 11/03/2010  . HYPERTRIGLYCERIDEMIA  01/25/2009  . Alcohol dependence ([place]) 10/11/2006  . NEUROPATHY, PERIPHERAL 10/11/2006  . HYPERTENSION, BENIGN SYSTEMIC 10/11/2006    Past Surgical History:  Procedure Laterality Date  . BREAST LUMPECTOMY Right 2001  . rt lumpectomy  2001    Prior to Admission medications   Medication Sig Start Date End Date Taking? Authorizing Provider  hydrochlorothiazide (HYDRODIURIL) 25 MG tablet Take 1 tablet (25 mg total) by mouth daily. 01/04/17   Verner Mould, MD  lisinopril (PRINIVIL,ZESTRIL) 40 MG tablet Take 1 tablet (40 mg total) by mouth daily. 01/04/17   Verner Mould, MD  Vitamin D, Ergocalciferol, (DRISDOL) 1.25 MG (50000 UNIT) CAPS capsule Take 50,000 Units by mouth every 7 (seven) days.    [provider]    Allergies Patient has no known allergies.  Family History  Problem Relation Age of Onset  . Leukemia Brother   . Lung cancer Maternal Aunt   . Colon cancer Neg Hx   . Stomach cancer Neg Hx   . Pancreatic cancer Neg Hx     Social History Social History   Tobacco Use  . Smoking status: Former Smoker    Packs/day: 0.50    Types: Cigarettes    Quit date: 11/02/2000    Years since quitting: 19.5  . Smokeless tobacco: Never Used  . Tobacco comment: unknown but before 2002  Vaping Use  . Vaping Use: Never used  Substance Use Topics  . Alcohol use: Yes  Alcohol/week: 42.0 standard drinks    Types: 42 Shots of liquor per week    Comment: -unknown amount pt. drinks beer and liquor  . Drug use: No    Review of Systems  Review of Systems  Constitutional: Positive for fatigue. Negative for fever.  HENT: Negative for congestion and sore throat.   Eyes: Negative for visual disturbance.  Respiratory: Negative for cough and shortness of breath.   Cardiovascular: Negative for chest pain.  Gastrointestinal: Positive for nausea and vomiting. Negative for abdominal pain and diarrhea.  Genitourinary: Negative for flank pain.   Musculoskeletal: Positive for arthralgias and myalgias. Negative for back pain and neck pain.  Skin: Negative for rash and wound.  Neurological: Positive for weakness.  All other systems reviewed and are negative.    ____________________________________________  PHYSICAL EXAM:      VITAL SIGNS: ED Triage Vitals  Enc Vitals Group     BP 05/20/20 1739 111/78     Pulse Rate 05/20/20 1739 81     Resp 05/20/20 1739 20     Temp 05/20/20 1739 98.4 F (36.9 C)     Temp Source 05/20/20 1739 Oral     SpO2 05/20/20 1739 100 %     Weight 05/20/20 1740 84 lb (38.1 kg)     Height 05/20/20 1740 5\' 7"  (1.702 m)     Head Circumference --      Peak Flow --      Pain Score 05/20/20 1740 0     Pain Loc --      Pain Edu? --      Excl. in [place]? --      Physical Exam Vitals and nursing note reviewed.  Constitutional:      General: She is not in acute distress.    Appearance: She is well-developed.  HENT:     Head: Normocephalic and atraumatic.     Mouth/Throat:     Mouth: Mucous membranes are dry.  Eyes:     Conjunctiva/sclera: Conjunctivae normal.  Cardiovascular:     Rate and Rhythm: Normal rate and regular rhythm.     Heart sounds: Normal heart sounds. No murmur heard.  No friction rub.  Pulmonary:     Effort: Pulmonary effort is normal. No respiratory distress.     Breath sounds: Normal breath sounds. No wheezing or rales.  Abdominal:     General: There is no distension.     Palpations: Abdomen is soft.     Tenderness: There is no abdominal tenderness.  Musculoskeletal:     Cervical back: Neck supple.  Skin:    General: Skin is warm.     Capillary Refill: Capillary refill takes less than 2 seconds.  Neurological:     Mental Status: She is alert and oriented to person, place, and time.     Motor: No abnormal muscle tone.       ____________________________________________   LABS (all labs ordered are listed, but only abnormal results are displayed)  Labs Reviewed   BASIC METABOLIC PANEL - Abnormal; Notable for the following components:      Result Value   Sodium 148 (*)    Potassium <2.0 (*)    Creatinine, Ser 1.06 (*)    Calcium 7.5 (*)    GFR calc non Af Amer 58 (*)    Anion gap 19 (*)    All other components within normal limits  CBC - Abnormal; Notable for the following components:   RBC 1.65 (*)  Hemoglobin 6.6 (*)    HCT 17.9 (*)    MCV 108.5 (*)    MCH 40.0 (*)    MCHC 36.9 (*)    RDW 15.6 (*)    nRBC 0.8 (*)    All other components within normal limits  MAGNESIUM - Abnormal; Notable for the following components:   Magnesium 1.0 (*)    All other components within normal limits  RESP PANEL BY RT PCR (RSV, FLU A&B, COVID)  VITAMIN B12  FOLATE  IRON AND TIBC  FERRITIN  RETICULOCYTES  TYPE AND SCREEN  PREPARE RBC (CROSSMATCH)    ____________________________________________  EKG: Normal sinus rhythm, ventricular rate 82.  QRS 117, QTc 569.  Likely prolonged QT, otherwise no acute ST elevations or depressions. ________________________________________  RADIOLOGY All imaging, including plain films, CT scans, and ultrasounds, independently reviewed by me, and interpretations confirmed via formal radiology reads.  ED MD interpretation:     Official radiology report(s): No results found.  ____________________________________________  PROCEDURES   Procedure(s) performed (including Critical Care):  .Critical Care Performed by: Duffy Bruce, MD Authorized by: Duffy Bruce, MD   Critical care provider statement:    Critical care time (minutes):  35   Critical care time was exclusive of:  Separately billable procedures and treating other patients and teaching time   Critical care was necessary to treat or prevent imminent or life-threatening deterioration of the following conditions:  Cardiac failure, circulatory failure and metabolic crisis   Critical care was time spent personally by me on the following  activities:  Development of treatment plan with patient or surrogate, discussions with consultants, evaluation of patient's response to treatment, examination of patient, obtaining history from patient or surrogate, ordering and performing treatments and interventions, ordering and review of laboratory studies, ordering and review of radiographic studies, pulse oximetry, re-evaluation of patient's condition and review of old charts   I assumed direction of critical care for this patient from another provider in my specialty: no      ____________________________________________  INITIAL IMPRESSION / MDM / [place] / ED COURSE  As part of my medical decision making, I reviewed the following data within the [place] notes reviewed and incorporated, Old chart reviewed, Notes from prior ED visits, and [place] Controlled Substance Database       *[name] was evaluated in Emergency Department on 05/20/2020 for the symptoms described in the history of present illness. She was evaluated in the context of the global COVID-19 pandemic, which necessitated consideration that the patient might be at risk for infection with the SARS-CoV-2 virus that causes COVID-19. Institutional protocols and algorithms that pertain to the evaluation of patients at risk for COVID-19 are in a state of rapid change based on information released by regulatory bodies including the CDC and federal and state organizations. These policies and algorithms were followed during the patient's care in the ED.  Some ED evaluations and interventions may be delayed as a result of limited staffing during the pandemic.*     Medical Decision Making: 69 female here with generalized weakness.  Lab work shows marked hypokalemia, hypomagnesemia, and anemia.  Suspect severe malnutrition in setting of chronic alcohol use leading to significant electrolyte derangements.  Regarding her anemia, she does not appear to  be actively bleeding. MCV 109, suspect anemia related to her EtOH abuse, likely folate deficiency. Anemia panel sent. Admit to medicine with transfusion, IV k and IV mag replacement. Patient updated and in agreement.  ____________________________________________  FINAL CLINICAL IMPRESSION(S) / ED DIAGNOSES  Final diagnoses:  Symptomatic anemia  Hypokalemia  Hypomagnesemia     MEDICATIONS GIVEN DURING THIS VISIT:  Medications  potassium chloride 10 mEq in 100 mL IVPB (10 mEq Intravenous New Bag/Given 05/20/20 1944)  lactated ringers infusion ( Intravenous New Bag/Given 05/20/20 1903)  0.9 %  sodium chloride infusion (has no administration in time range)  thiamine (B-1) injection 100 mg (has no administration in time range)  folic acid 1 mg in sodium chloride 0.9 % 50 mL IVPB (has no administration in time range)  potassium chloride SA (KLOR-CON) CR tablet 40 mEq (40 mEq Oral Given 05/20/20 1857)  magnesium sulfate IVPB 2 g 50 mL (2 g Intravenous New Bag/Given 05/20/20 1857)     ED Discharge Orders    None       Note:  This document was prepared using Dragon voice recognition software and may include unintentional dictation errors.   Duffy Bruce, MD 05/20/20 Jon Billings, MD 05/20/20 2006

## 2020-05-20 NOTE — ED Triage Notes (Signed)
Pt to triage via wheelchair.  Pt reports she was sent to er for eval of low hemoglobin.  Pt reports aching all over.  No dizziness.  Pt alert  Speech clear.

## 2020-05-20 NOTE — ED Notes (Signed)
Dr. Duncan at bedside 

## 2020-05-21 ENCOUNTER — Encounter: Payer: Self-pay | Admitting: Internal Medicine

## 2020-05-21 DIAGNOSIS — R531 Weakness: Secondary | ICD-10-CM

## 2020-05-21 LAB — COMPREHENSIVE METABOLIC PANEL
ALT: 17 U/L (ref 0–44)
AST: 62 U/L — ABNORMAL HIGH (ref 15–41)
Albumin: 2.1 g/dL — ABNORMAL LOW (ref 3.5–5.0)
Alkaline Phosphatase: 96 U/L (ref 38–126)
Anion gap: 16 — ABNORMAL HIGH (ref 5–15)
BUN: 16 mg/dL (ref 6–20)
CO2: 23 mmol/L (ref 22–32)
Calcium: 7.3 mg/dL — ABNORMAL LOW (ref 8.9–10.3)
Chloride: 105 mmol/L (ref 98–111)
Creatinine, Ser: 1.14 mg/dL — ABNORMAL HIGH (ref 0.44–1.00)
GFR calc non Af Amer: 53 mL/min — ABNORMAL LOW (ref >60)
Glucose, Bld: 82 mg/dL (ref 70–99)
Potassium: 2.7 mmol/L — CL (ref 3.5–5.1)
Sodium: 144 mmol/L (ref 135–145)
Total Bilirubin: 0.8 mg/dL (ref 0.3–1.2)
Total Protein: 5.7 g/dL — ABNORMAL LOW (ref 6.5–8.1)

## 2020-05-21 LAB — CBC
HCT: 24 % — ABNORMAL LOW (ref 36.0–46.0)
Hemoglobin: 8.6 g/dL — ABNORMAL LOW (ref 12.0–15.0)
MCH: 35.4 pg — ABNORMAL HIGH (ref 26.0–34.0)
MCHC: 35.8 g/dL (ref 30.0–36.0)
MCV: 98.8 fL (ref 80.0–100.0)
Platelets: 283 10*3/uL (ref 150–400)
RBC: 2.43 MIL/uL — ABNORMAL LOW (ref 3.87–5.11)
RDW: 17.4 % — ABNORMAL HIGH (ref 11.5–15.5)
WBC: 5.2 10*3/uL (ref 4.0–10.5)
nRBC: 0.6 % — ABNORMAL HIGH (ref 0.0–0.2)

## 2020-05-21 LAB — HEPATIC FUNCTION PANEL
ALT: 16 U/L (ref 0–44)
AST: 51 U/L — ABNORMAL HIGH (ref 15–41)
Albumin: 2.1 g/dL — ABNORMAL LOW (ref 3.5–5.0)
Alkaline Phosphatase: 86 U/L (ref 38–126)
Bilirubin, Direct: 0.2 mg/dL (ref 0.0–0.2)
Indirect Bilirubin: 0.4 mg/dL (ref 0.3–0.9)
Total Bilirubin: 0.6 mg/dL (ref 0.3–1.2)
Total Protein: 5.9 g/dL — ABNORMAL LOW (ref 6.5–8.1)

## 2020-05-21 LAB — PREPARE RBC (CROSSMATCH)

## 2020-05-21 LAB — BASIC METABOLIC PANEL
Anion gap: 14 (ref 5–15)
Anion gap: 18 — ABNORMAL HIGH (ref 5–15)
BUN: 15 mg/dL (ref 6–20)
BUN: 19 mg/dL (ref 6–20)
CO2: 22 mmol/L (ref 22–32)
CO2: 23 mmol/L (ref 22–32)
Calcium: 7.2 mg/dL — ABNORMAL LOW (ref 8.9–10.3)
Calcium: 7.4 mg/dL — ABNORMAL LOW (ref 8.9–10.3)
Chloride: 103 mmol/L (ref 98–111)
Chloride: 104 mmol/L (ref 98–111)
Creatinine, Ser: 1.01 mg/dL — ABNORMAL HIGH (ref 0.44–1.00)
Creatinine, Ser: 1.19 mg/dL — ABNORMAL HIGH (ref 0.44–1.00)
GFR calc non Af Amer: 51 mL/min — ABNORMAL LOW (ref >60)
GFR, Estimated: >60 mL/min (ref >60)
Glucose, Bld: 201 mg/dL — ABNORMAL HIGH (ref 70–99)
Glucose, Bld: 91 mg/dL (ref 70–99)
Potassium: 2.8 mmol/L — ABNORMAL LOW (ref 3.5–5.1)
Potassium: <2 mmol/L — CL (ref 3.5–5.1)
Sodium: 139 mmol/L (ref 135–145)
Sodium: 145 mmol/L (ref 135–145)

## 2020-05-21 LAB — MAGNESIUM
Magnesium: 1.5 mg/dL — ABNORMAL LOW (ref 1.7–2.4)
Magnesium: 1.7 mg/dL (ref 1.7–2.4)

## 2020-05-21 LAB — PHOSPHORUS: Phosphorus: 4 mg/dL (ref 2.5–4.6)

## 2020-05-21 MED ORDER — POTASSIUM CHLORIDE CRYS ER 20 MEQ PO TBCR
40.0000 meq | EXTENDED_RELEASE_TABLET | ORAL | Status: AC
  Administered 2020-05-21 (×2): 40 meq via ORAL
  Filled 2020-05-21 (×2): qty 2

## 2020-05-21 MED ORDER — POTASSIUM CHLORIDE 10 MEQ/100ML IV SOLN
10.0000 meq | INTRAVENOUS | Status: AC
  Administered 2020-05-21 (×6): 10 meq via INTRAVENOUS
  Filled 2020-05-21 (×2): qty 100

## 2020-05-21 MED ORDER — MAGNESIUM SULFATE 2 GM/50ML IV SOLN
2.0000 g | Freq: Once | INTRAVENOUS | Status: AC
  Administered 2020-05-21: 2 g via INTRAVENOUS
  Filled 2020-05-21: qty 50

## 2020-05-21 MED ORDER — LISINOPRIL 20 MG PO TABS
40.0000 mg | ORAL_TABLET | Freq: Every day | ORAL | Status: DC
  Administered 2020-05-21 – 2020-05-22 (×2): 40 mg via ORAL
  Filled 2020-05-21: qty 4
  Filled 2020-05-21: qty 2

## 2020-05-21 MED ORDER — FERROUS SULFATE 325 (65 FE) MG PO TABS
325.0000 mg | ORAL_TABLET | Freq: Every day | ORAL | Status: DC
  Administered 2020-05-21 – 2020-05-22 (×2): 325 mg via ORAL
  Filled 2020-05-21 (×2): qty 1

## 2020-05-21 MED ORDER — POTASSIUM CHLORIDE 10 MEQ/100ML IV SOLN
10.0000 meq | INTRAVENOUS | Status: AC
  Administered 2020-05-21 (×4): 10 meq via INTRAVENOUS
  Filled 2020-05-21 (×3): qty 100

## 2020-05-21 MED ORDER — ENSURE ENLIVE PO LIQD: 237.0000 mL | Freq: Two times a day (BID) | ORAL | Status: DC

## 2020-05-21 MED ORDER — POTASSIUM CHLORIDE 10 MEQ/100ML IV SOLN
10.0000 meq | Freq: Once | INTRAVENOUS | Status: AC
  Administered 2020-05-21: 10 meq via INTRAVENOUS
  Filled 2020-05-21: qty 100

## 2020-05-21 MED ORDER — HYDRALAZINE HCL 20 MG/ML IJ SOLN
10.0000 mg | INTRAMUSCULAR | Status: DC | PRN
  Administered 2020-05-21: 10 mg via INTRAVENOUS
  Filled 2020-05-21: qty 1

## 2020-05-21 MED ORDER — POTASSIUM CHLORIDE CRYS ER 20 MEQ PO TBCR
40.0000 meq | EXTENDED_RELEASE_TABLET | Freq: Once | ORAL | Status: AC
  Administered 2020-05-21: 40 meq via ORAL
  Filled 2020-05-21: qty 2

## 2020-05-21 MED ORDER — POTASSIUM CHLORIDE 10 MEQ/100ML IV SOLN
10.0000 meq | INTRAVENOUS | Status: AC
  Administered 2020-05-21 (×4): 10 meq via INTRAVENOUS
  Filled 2020-05-21 (×5): qty 100

## 2020-05-21 NOTE — Progress Notes (Signed)
Initial Nutrition Assessment  DOCUMENTATION CODES:   Underweight  INTERVENTION:   Ensure Enlive po BID, each supplement provides 350 kcal and 20 grams of protein  MVI daily   Liberalize diet   Pt at high refeed risk; recommend monitor potassium, magnesium and phosphorus labs daily until stable  NUTRITION DIAGNOSIS:   Increased nutrient needs related to other (see comment) (Pt is underweight) as evidenced by estimated needs.  GOAL:   Patient will meet greater than or equal to 90% of their needs  MONITOR:   PO intake, Supplement acceptance, Labs, Weight trends, Skin, I & O's  REASON FOR ASSESSMENT:   Malnutrition Screening Tool    ASSESSMENT:   59 y/o female with h/o hypertension, breast cancer status post chemo and XRT in 2001, alcohol use disorder, microcytic anemia seen by hematology, Dr. Janese Banks in July 2021 who is admitted with hypokalemia and anemia.   RD working remotely.  Spoke with pt via phone. Pt reports fair appetite and oral intake pta. Pt with h/o intermittent nausea and poor appetite at baseline. RD suspects pt with decreased appetite and oral intake at baseline as pt is underweight. Pt reports that she is eating 100% of meals in hospital. Pt reports that she does drink Ensure sometimes at home but currently she is out; pt enjoys vanilla and chocolate flavors. RD will add supplements to help pt meet her estimated needs. RD will also liberalize the heart healthy portion of pt's diet as this is restrictive of protein. Pt is at refeed risk. Pt reports weight loss pta but states that she is now gaining it back. Per chart, pt is down 7lbs(8%) over the past 3 months; this is significant as pt is already underweight. Pt is at high risk for malnutrition but unable to diagnose at this time as NFPE cannot be performed. RD will obtain nutrition related exam and history at follow-up.   Medications reviewed and include: ferrous sulfate, folic acid, MVI, protonix, thiamine, LRS  @50ml /hr  Labs reviewed: K 2.7(L), creat 1.14(H), P 4.0 wnl, Mg 1.5(L), AST 62(H) Folate 4.7(L), B12 261- 10/7 Hgb 8.6(L), Hct 24.0(L)  NUTRITION - FOCUSED PHYSICAL EXAM: Unable to perform at this time   Diet Order:   Diet Order            Diet 2 gram sodium Room service appropriate? Yes with Assist; Fluid consistency: Thin  Diet effective now                EDUCATION NEEDS:   Education needs have been addressed  Skin:  Skin Assessment: Reviewed RN Assessment  Last BM:  10/8  Height:   Ht Readings from Last 1 Encounters:  05/20/20 5\' 7"  (1.702 m)    Weight:   Wt Readings from Last 1 Encounters:  05/20/20 38.1 kg    Ideal Body Weight:  61.36 kg  BMI:  Body mass index is 13.16 kg/m.  Estimated Nutritional Needs:   Kcal:  1300-1500kcal/day  Protein:  65-75g/day  Fluid:  1.2-1.4L/day  Koleen Distance MS, RD, LDN Please refer to [facility] for RD and/or RD on-call/weekend/after hours pager

## 2020-05-21 NOTE — ED Notes (Signed)
Date and time results received: 05/21/20 0024 Test: K+  Critical Value: <2  Name of Provider Notified: Randol Kern

## 2020-05-21 NOTE — Progress Notes (Signed)
MEDICATION RELATED CONSULT NOTE   Pharmacy Consult for electrolyte management  Indication: electrolyte derangement  No Known Allergies  Patient Measurements: Height: 5\' 7"  (170.2 cm) Weight: 38.1 kg (84 lb) IBW/kg (Calculated) : 61.6  Labs: Recent Labs    05/20/20 1745 05/20/20 1745 05/20/20 2341 05/21/20 0541 05/21/20 1505  WBC 5.2  --   --  5.2  --   HGB 6.6*  --   --  8.6*  --   HCT 17.9*  --   --  24.0*  --   PLT 388  --   --  283  --   CREATININE 1.06*   < > 1.19* 1.14* 1.01*  MG 1.0*  --  1.7 1.5*  --   PHOS  --   --   --  4.0  --   ALBUMIN  --   --  2.1* 2.1*  --   PROT  --   --  5.9* 5.7*  --   AST  --   --  51* 62*  --   ALT  --   --  16 17  --   ALKPHOS  --   --  86 96  --   BILITOT  --   --  0.6 0.8  --   BILIDIR  --   --  0.2  --   --   IBILI  --   --  0.4  --   --    < > = values in this interval not displayed.   Estimated Creatinine Clearance: 37 mL/min (A) (by C-G formula based on SCr of 1.01 mg/dL (H)).  Medical History: Past Medical History:  Diagnosis Date  . Alcoholic hepatitis   . Alcoholism ([place])   . Anemia   . Breast cancer ([place]) 2001   Rt side. Had lumpectomy, chemo and XRT by [facility]  . Hypertension   . Personal history of chemotherapy   . Personal history of radiation therapy   . Shortness of breath     Assessment: 59 yo female complaining of weakness and found to have electrolyte abnormalities. Pharmacy has been consulted to manage electrolyte repletion.   Today K 2.7 (now received 77meq KCl repletion since lab draw), Mg 1.5, Calcium 8.8 (corrected for hypoalbuminemia)  PM follow up: K 2.8, renal function improving; pt received 3 of 4 previously ordered K runs  Goal of Therapy:  K > 4 Mg > 2 Ca > 8.5  Plan:  Repeat KCl 10 meq IV x 4 runs KCl 40 mEq PO x2 doses Recheck electrolytes with AM labs   Rayna Sexton, PharmD, BCPS Clinical Pharmacist 05/21/2020 4:19 PM

## 2020-05-21 NOTE — Consult Note (Signed)
Consultation  Referring Provider:     Dr Damita Dunnings Admit date  05/20/20 Consult date        05/21/20 Reason for Consultation:     anemia         HPI:   [name] is a 59 y.o. female hypertension, breast cancer status post chemo and XRT, and alcohol use disorder, chronic anemia, admitted for weakness/abnormal labs yesterday. Follows with Dr Janese Banks of hematology and has had recent endoscopic evaluation as below:            Colonoscopy 05/06/20- Dr Loletha Carrow- anemia/weight loss- 2 small polyps, however it was noted that the prep was rather poor, with semi solid stool throughout the colon- polyps were adenomatous, 1y repeat recommended.  EGD 05/06/20- Dr Danis/anemia/weight loss- benign appearing esophageal stenosis dilated to 14.5; gastritis with some atrophy. Biopsies of esophagus, stomach and duodenum were taken. Esophageal biopsies negative. Stomach with chronic gastritis. Duodenum with some edema and blunting but no increased lymphocytosis. There was no dysplasia/malignancy.  CT 7/21 IMPRESSION: 1. Cholelithiasis without definite evidence of cholecystitis and with pattern of calcifications in the pancreatic head unchanged from previous exam. Favor pancreatic ductal calcifications/chronic pancreatitis given above findings. Calcifications in this region do not appear to be centered within the common bile duct. 2. For above findings would suggest correlation with biliary enzymes to determine whether further evaluation of the biliary tree may be warranted. 3. Stranding overlying the sacrum with irregularity near the upper margin of the gluteal cleft. No fluid collection. Findings are concerning for sacral decubitus ulcer. 4. Severe hepatic steatosis. 5. Ahaustral appearance of the colon is nonspecific without signs of current inflammation. This may be due to prior inflammation. Appendix is normal. 6. Bilateral femoral AVN 7. Aortic atherosclerosis. Do note in further chart review she has history  of chronic pancreatitis dating back to at least 2013 on MRCP  Last hematology note indicates anemia of chronic disease without evidence of hemolysis, normal b12.b1/b6 levels, and negative myeloma panel. It is suspected her chronic macrocytic anemia is related to her ongoing etoh abuse and myelosuppression due to same. Her chronic pancreatitis and liver changes are related to the ongoing etoh abuse. HBV/HCV/HIV screens negative this year. Hemoglobin yesterday was noted to be 6.6 and has received 2u prbcs since admission with hgb improved to 8.6. stool for occult blood test pending. Red cells rather macrocytic. Patient reports she has some reflux problems- not taking any ppi or H2ra at home currently but does endorse using tums. She denies any dysphagia and abdominal pain, melena/NV, hematochezia. Noted she is receiving IV pantoprazole 40mg  pobid here.  Liver enzymes have alcoholic pattern- does endorse drinking a pint of liquor daily- does not feel like this affects her health adversely at this point.  Bilirubin and alp normal. Platelet count normal. No esophageal varices on recent EGD. No ascites. She has received some K+ and magnesium since admission for hypokalemia/hypokalemia.  Past Medical History:  Diagnosis Date  . Alcoholic hepatitis   . Alcoholism ([place])   . Anemia   . Breast cancer ([place]) 2001   Rt side. Had lumpectomy, chemo and XRT by [facility]  . Hypertension   . Personal history of chemotherapy   . Personal history of radiation therapy   . Shortness of breath     Past Surgical History:  Procedure Laterality Date  . BREAST LUMPECTOMY Right 2001  . rt lumpectomy  2001    Family History  Problem Relation Age of Onset  . Leukemia Brother   .  Lung cancer Maternal Aunt   . Colon cancer Neg Hx   . Stomach cancer Neg Hx   . Pancreatic cancer Neg Hx      Social History   Tobacco Use  . Smoking status: Former Smoker    Packs/day: 0.50    Types: Cigarettes    Quit date: 11/02/2000     Years since quitting: 19.5  . Smokeless tobacco: Never Used  . Tobacco comment: unknown but before 2002  Vaping Use  . Vaping Use: Never used  Substance Use Topics  . Alcohol use: Yes    Alcohol/week: 42.0 standard drinks    Types: 42 Shots of liquor per week    Comment: -unknown amount pt. drinks beer and liquor  . Drug use: No    Prior to Admission medications   Medication Sig Start Date End Date Taking? Authorizing Provider  Ferrous Sulfate (IRON) 325 (65 Fe) MG TABS Take 1 tablet by mouth daily. 05/11/20  Yes [provider]  hydrochlorothiazide (HYDRODIURIL) 25 MG tablet Take 1 tablet (25 mg total) by mouth daily. 01/04/17  Yes Verner Mould, MD  lisinopril (PRINIVIL,ZESTRIL) 40 MG tablet Take 1 tablet (40 mg total) by mouth daily. 01/04/17  Yes Verner Mould, MD  meloxicam (MOBIC) 7.5 MG tablet Take 7.5 mg by mouth 2 (two) times daily as needed. 05/10/20  Yes [provider]  Vitamin D, Ergocalciferol, (DRISDOL) 1.25 MG (50000 UNIT) CAPS capsule Take 50,000 Units by mouth every 7 (seven) days.   Yes [provider]    Current Facility-Administered Medications  Medication Dose Route Frequency Provider Last Rate Last Admin  . acetaminophen (TYLENOL) tablet 650 mg  650 mg Oral Q6H PRN Athena Masse, MD       Or  . acetaminophen (TYLENOL) suppository 650 mg  650 mg Rectal Q6H PRN Athena Masse, MD      . ferrous sulfate tablet 325 mg  325 mg Oral Daily Fritzi Mandes, MD   325 mg at 05/21/20 0934  . folic acid (FOLVITE) tablet 1 mg  1 mg Oral Daily Judd Gaudier V, MD   1 mg at 05/21/20 0934  . hydrALAZINE (APRESOLINE) injection 10 mg  10 mg Intravenous Q4H PRN Sharion Settler, NP   10 mg at 05/21/20 0701  . lactated ringers infusion   Intravenous Continuous Sharion Settler, NP 50 mL/hr at 05/21/20 0927 New Bag at 05/21/20 0927  . lisinopril (ZESTRIL) tablet 40 mg  40 mg Oral Daily Fritzi Mandes, MD   40 mg at 05/21/20 0934  .  LORazepam (ATIVAN) injection 0-4 mg  0-4 mg Intravenous Q6H Athena Masse, MD       Followed by  . [START ON 05/22/2020] LORazepam (ATIVAN) injection 0-4 mg  0-4 mg Intravenous Q12H Athena Masse, MD      . LORazepam (ATIVAN) tablet 1-4 mg  1-4 mg Oral Q1H PRN Athena Masse, MD       Or  . LORazepam (ATIVAN) injection 1-4 mg  1-4 mg Intravenous Q1H PRN Athena Masse, MD      . multivitamin with minerals tablet 1 tablet  1 tablet Oral Daily Athena Masse, MD   1 tablet at 05/21/20 0915  . ondansetron (ZOFRAN) tablet 4 mg  4 mg Oral Q6H PRN Athena Masse, MD       Or  . ondansetron [facility]) injection 4 mg  4 mg Intravenous Q6H PRN Athena Masse, MD      .  pantoprazole (PROTONIX) injection 40 mg  40 mg Intravenous Q12H Athena Masse, MD   40 mg at 05/21/20 0915  . potassium chloride 10 mEq in 100 mL IVPB  10 mEq Intravenous Q1 Hr x 4 Fritzi Mandes, MD   Stopped at 05/21/20 1228  . thiamine tablet 100 mg  100 mg Oral Daily Judd Gaudier V, MD   100 mg at 05/21/20 0915    Allergies as of 05/20/2020  . (No Known Allergies)     Review of Systems:    All systems reviewed and negative except where noted in HPI.     Physical Exam:  Vital signs in last 24 hours: Temp:  [97.9 F (36.6 C)-98.5 F (36.9 C)] 97.9 F (36.6 C) (10/08 1250) Pulse Rate:  [69-99] 87 (10/08 1250) Resp:  [13-20] 17 (10/08 1250) BP: (111-179)/(78-129) 159/105 (10/08 1250) SpO2:  [97 %-100 %] 100 % (10/08 1250) Weight:  [38.1 kg] 38.1 kg (10/07 1740) Last BM Date: 05/21/20 General:   Pleasant woman in NAD Head:  Normocephalic and atraumatic. Eyes:   No icterus.   Conjunctiva pink. Ears:  Normal auditory acuity. Mouth: Mucosa pink moist, no lesions. Neck:  Supple; no masses felt Lungs:  Respirations even and unlabored. Lungs clear to auscultation bilaterally.   No wheezes, crackles, or rhonchi.  Heart:  S1S2, RRR, no MRG. No edema. Abdomen:   Flat, soft, nondistended, nontender. Normal bowel sounds.  No appreciable masses or hepatomegaly. No rebound signs or other peritoneal signs. Rectal:  Not performed.  Msk:  MAEW x4, No clubbing or cyanosis. Strength 5/5. Symmetrical without gross deformities. Neurologic:  Alert and  oriented x4;  Cranial nerves II-XII intact.  Skin:  Warm, dry, pink without significant lesions or rashes. Psych:  Alert and cooperative. Normal affect.limited insight and limited judgement.  LAB RESULTS: Recent Labs    05/20/20 1745 05/21/20 0541  WBC 5.2 5.2  HGB 6.6* 8.6*  HCT 17.9* 24.0*  PLT 388 283   BMET Recent Labs    05/20/20 1745 05/20/20 2341 05/21/20 0541  NA 148* 145 144  K <2.0* <2.0* 2.7*  CL 104 104 105  CO2 25 23 23   GLUCOSE 95 91 82  BUN 20 19 16   CREATININE 1.06* 1.19* 1.14*  CALCIUM 7.5* 7.4* 7.3*   LFT Recent Labs    05/20/20 2341 05/20/20 2341 05/21/20 0541  PROT 5.9*   < > 5.7*  ALBUMIN 2.1*   < > 2.1*  AST 51*   < > 62*  ALT 16   < > 17  ALKPHOS 86   < > 96  BILITOT 0.6   < > 0.8  BILIDIR 0.2  --   --   IBILI 0.4  --   --    < > = values in this interval not displayed.   PT/INR No results for input(s): LABPROT, INR in the last 72 hours.  STUDIES: No results found.     Impression / Plan:   1. Exacerbation of anemia of chronic disease, likely etoh related. Do note recent procedures/imaging- will review with Dr Haig Prophet. Did discuss systemic effects of chronic etoh use with patient, including bone marrow suppression, recommend etoh cessation. She acknowledged, but does not appear interested in this at this time. Awaiting fobt test results.  Agree with transfusion and PPI 2. GERD- recommend discharging home on acid reducer for her chronic gerd.   Addendum: have reviewed case with Dr Haig Prophet, No current plans to repeat scope/imaging- but should follow up  with her GI provider as o/p. In further consideration checking fecal elastase may be helpful to assess for EPI due to her chronic pancreatitis, in regards to  weight loss.  Thank you very much for this consult. These services were provided by Stephens November, NP-C, in collaboration with Dr Haig Prophet with whom I have discussed this patient in full.   Stephens November, NP-C

## 2020-05-21 NOTE — ED Notes (Signed)
Blood consent signed in paper form

## 2020-05-21 NOTE — Progress Notes (Signed)
[place] [facility] [place] NAME: [name]    MR#:  [id number]  DATE OF BIRTH:  [birth year]  SUBJECTIVE:  patient not the best historian however came in with hemoglobin of 6.6 after she was advised by her PCP once outpatient labs were done. She been having weakness. Drinks on a regular basis although at present she is more interested in eating her lunch and was not able to answer postop questions appropriately.  Appears week. Quite a bit of shaking in both hands. Complains of leg pain and cannot stand for long. Recently moved in with her sister.  REVIEW OF SYSTEMS:   Review of Systems  Constitutional: Positive for weight loss. Negative for chills and fever.  HENT: Negative for ear discharge, ear pain and nosebleeds.   Eyes: Negative for blurred vision, pain and discharge.  Respiratory: Negative for sputum production, shortness of breath, wheezing and stridor.   Cardiovascular: Negative for chest pain, palpitations, orthopnea and PND.  Gastrointestinal: Negative for abdominal pain, diarrhea, nausea and vomiting.  Genitourinary: Negative for frequency and urgency.  Musculoskeletal: Positive for joint pain and myalgias. Negative for back pain.  Neurological: Positive for weakness. Negative for sensory change, speech change and focal weakness.  Psychiatric/Behavioral: Negative for depression and hallucinations. The patient is not nervous/anxious.    Tolerating Diet:yes Tolerating PT: pending  DRUG ALLERGIES:  No Known Allergies  VITALS:  Blood pressure (!) 138/95, pulse 87, temperature 97.9 F (36.6 C), temperature source Oral, resp. rate 17, height 5\' 7"  (1.702 m), weight 38.1 kg, SpO2 100 %.  PHYSICAL EXAMINATION:   Physical Exam  GENERAL:  59 y.o.-year-old patient lying in the bed with no acute distress. Thin frail cachectic malnourished HEENT: Head atraumatic, normocephalic. Oropharynx and nasopharynx clear.  NECK:  Supple, no jugular  venous distention. No thyroid enlargement, no tenderness.  LUNGS: Normal breath sounds bilaterally, no wheezing, rales, rhonchi. No use of accessory muscles of respiration.  CARDIOVASCULAR: S1, S2 normal. No murmurs, rubs, or gallops.  ABDOMEN: Soft, nontender, nondistended. Bowel sounds present. No organomegaly or mass.  EXTREMITIES: No cyanosis, clubbing  + edema b/l.    NEUROLOGIC: grossly nonfocal. Generalized ability. Bilateral upper extremity tremors appears chronic  PSYCHIATRIC:  patient is alert and oriented x 2.  SKIN: No obvious rash, lesion, or ulcer.   LABORATORY PANEL:  CBC Recent Labs  Lab 05/21/20 0541  WBC 5.2  HGB 8.6*  HCT 24.0*  PLT 283    Chemistries  Recent Labs  Lab 05/21/20 0541 05/21/20 0541 05/21/20 1505  NA 144   < > 139  K 2.7*   < > 2.8*  CL 105   < > 103  CO2 23   < > 22  GLUCOSE 82   < > 201*  BUN 16   < > 15  CREATININE 1.14*   < > 1.01*  CALCIUM 7.3*   < > 7.2*  MG 1.5*  --   --   AST 62*  --   --   ALT 17  --   --   ALKPHOS 96  --   --   BILITOT 0.8  --   --    < > = values in this interval not displayed.   Cardiac Enzymes No results for input(s): TROPONINI in the last 168 hours. RADIOLOGY:  No results found. ASSESSMENT AND PLAN:  59 year old female with history of hypertension, breast cancer status post chemo and XRT, and alcohol use disorder, as  well as alcohol related microcytic anemia seen by hematology, Dr. Janese Banks in July 2021 with hemoglobin 6.6 who presented to the emergency room on the advice of her PCP, after outpatient labs done on 9/15 showed hypokalemia and anemia.  Patient symptomatic for weakness, weight loss and nausea.    Generalized weakness -Suspect multifactorial.  Patient has had weight loss for the past year, nausea with intermittent vomiting-- with poor PO intake suspected from alcohol abuse. -Treat  etiologies as outlined below    Symptomatic anemia with history of macrocytic anemia -Patient had a full  hematology work-up in July by Dr. Janese Banks with final diagnosis microcytic anemia related to alcohol use -Hemoglobin 6.6, down from 9.8 when patient was evaluated by hematology -status post 2 unit blood transfusion --hemoglobin 8.6 -GI consult with Dr. Haig Prophet. Patient recently had EGD and colonoscopy in September. -- EGD showed chronic gastritis. -- Colonoscopy showed poor prep. Polyps were excised. Appeared  benign    Hypomagnesemia Severe hypokalemia -Potassium less than 2, possibly in part related to thiazide diuretic/poor PO intake -Receiving oral and IV potassium repletion -- replete mag -- phosphorus 4.0    Alcohol dependence ([place]), moderate -Patient drinks a pint of liquor daily -Thiamine folate and multivitamin.  IV thiamine given in the ER -CIWA withdrawal protocol-- patient has chronic tremors  Persistent nausea Decreased oral intake, decreased appetite, weight loss -Protonix -- appreciate dietitian input  HTN -resume lisinopril. Hold HCTZ due to electrolyte abnormalities   Protein calorie malnutrition ([place]), suspect severe -Likely related to chronic alcohol use, chronic nausea, -Nutritionist consult appreciated -Patient with weight loss over the past several months, with BMI of 13    History of breast cancer -Status post chemo and radiation.    DVT prophylaxis: SCDs given severe anemia code Status: full code  Family Communication:  none  Disposition Plan: Back to previous home environment Consults called: GI  Status is: Inpatient  Remains inpatient appropriate because:IV treatments appropriate due to intensity of illness or inability to take PO   Dispo: The patient is from: Home              Anticipated d/c is to: Home              Anticipated d/c date is: 2 days              Patient currently is not medically stable to d/c. patient admitted with low hemoglobin and significant electrolyte abnormality in the setting of alcohol abuse. Improving slowly. PT  consult pending      TOTAL TIME TAKING CARE OF THIS PATIENT: *25* minutes.  >50% time spent on counselling and coordination of care  Note: This dictation was prepared with Dragon dictation along with smaller phrase technology. Any transcriptional errors that result from this process are unintentional.  Fritzi Mandes M.D    [facility]   CC: Primary care physician; Synthia Innocent Audrea Muscat, NPPatient ID: [name], female   DOB: 1961/06/23, 59 y.o.   MRN: [id number]

## 2020-05-21 NOTE — TOC Initial Note (Signed)
Transition of Care [facility]) - Initial/Assessment Note    Patient Details  Name: [name] MRN: [id number] Date of Birth: 1960/11/08  Transition of Care [facility]) CM/SW Contact:    Iona Beard, [place] Phone Number: 05/21/2020, 12:14 PM  Clinical Narrative:                 Pt presents to ED with generalized weakness. TOC received consult due to pts substance abuse. TOC completed assessment and contacted pts sister, [name] [phone] for collateral information. Pt stated that she currently lives with her sister. Pt stated that she is able to bathe herself with assistance of her sister and she needs assistance fixing meals because her legs swell but she can feed herself. Pt stated that she can dress herself little by little. Pt states that she is continent. Pt states that she does not have home health but would be interested if recommended, pt states that she would not be interested in SNF if recommended. Pt states that she has no equipment at home. Pt states that she does drink but does not know how much.   Per conversation with pts sister [name] pt does need assistance with her ADL's due to her inability to stand for long periods of time and pain in legs. Ms. Jerline Pain stated that pt is not continent and that she has been wearing pull ups while at Ms. Marigene Ehlers home. Ms. Jerline Pain could not state how much pt was drinking before she moved into Ms. Parker's home but that it was "a lot". Ms. Jerline Pain also stated that now pt drinks about a pint of liquor a day but is not sure if she drinks more when she is in her room. Pt does not drive and Ms. Jerline Pain is able to take her to appointments and pick up her medications. Per Ms. Jerline Pain pt does have a rollator that she uses at home and has a wheelchair if needed. Ms. Jerline Pain stated that pt is able to return to her home once she is ready for discharge and that they would be interested in [facility] if recommended. TOC spoke with Attending about if pt would be  receiving PT eval. TOC has not received reply. TOC to follow.   Expected Discharge Plan: [place] Barriers to Discharge: Continued Medical Work up, ED Active Substance Abuse   Patient Goals and CMS Choice Patient states their goals for this hospitalization and ongoing recovery are:: Return to sisters home with [facility].   Choice offered to / list presented to : NA  Expected Discharge Plan and Services Expected Discharge Plan: [facility] In-house Referral: Clinical Social Work Discharge Planning Services: NA Post Acute Care Choice: [place] arrangements for the past 2 months: Single Family Home                 DME Arranged: N/A DME Agency: NA                  Prior Living Arrangements/Services Living arrangements for the past 2 months: Single Family Home Lives with:: Siblings Patient language and need for interpreter reviewed:: Yes Do you feel safe going back to the place where you live?: Yes        Care giver support system in place?: Yes (comment) [name] (Sister) [phone])   Criminal Activity/Legal Involvement Pertinent to Current Situation/Hospitalization: No - Comment as needed  Activities of Daily Living      Permission Sought/Granted  Emotional Assessment Appearance:: Appears older than stated age Attitude/Demeanor/Rapport: Engaged Affect (typically observed): Accepting Orientation: : Oriented to  Time, Oriented to Self, Oriented to Place Alcohol / Substance Use: Alcohol Use Psych Involvement: No (comment)  Admission diagnosis:  Hypokalemia [E87.6] Patient Active Problem List   Diagnosis Date Noted  . Generalized weakness 05/20/2020  . Protein calorie malnutrition ([place]) 05/20/2020  . History of breast cancer 05/20/2020  . Cough 06/30/2012  . Heme + stool 03/15/2012  . Symptomatic anemia 03/15/2012  . Necrotizing pneumonia ([place]) 03/06/2012  . Cholelithiasis 03/01/2012  .  Abnormal finding on GI tract imaging 03/01/2012  . Occasional numbness/prickling/tingling of fingers and toes 12/15/2011  . Hypokalemia 12/15/2011  . Hypomagnesemia 08/06/2011  . Unspecified episodic mood disorder 07/24/2011  . Tobacco abuse 11/03/2010  . HYPERTRIGLYCERIDEMIA 01/25/2009  . H/O macrocytic anemia 10/30/2007  . Alcohol dependence ([place]) 10/11/2006  . NEUROPATHY, PERIPHERAL 10/11/2006  . HYPERTENSION, BENIGN SYSTEMIC 10/11/2006   PCP:  Marliss Coots, NP Pharmacy:   [facility] [address], [place] [address] [place] [address] Phone: [phone] Fax: [phone]  [facility] [address], [place] [address] Phone: [phone] Fax: [place] [address] Phone: [phone] Fax: [phone]  [place], [place] [address] Phone: [phone] Fax: [phone]     Social Determinants of Health (SDOH) Interventions    Readmission Risk Interventions No flowsheet data found.

## 2020-05-21 NOTE — Progress Notes (Signed)
MEDICATION RELATED CONSULT NOTE - INITIAL   Pharmacy Consult for electrolyte management  Indication: electrolyte derangement  No Known Allergies  Patient Measurements: Height: 5\' 7"  (170.2 cm) Weight: 38.1 kg (84 lb) IBW/kg (Calculated) : 61.6  Labs: Recent Labs    05/20/20 1745 05/20/20 2341 05/21/20 0541  WBC 5.2  --  5.2  HGB 6.6*  --  8.6*  HCT 17.9*  --  24.0*  PLT 388  --  283  CREATININE 1.06* 1.19* 1.14*  MG 1.0* 1.7 1.5*  ALBUMIN  --  2.1* 2.1*  PROT  --  5.9* 5.7*  AST  --  51* 62*  ALT  --  16 17  ALKPHOS  --  86 96  BILITOT  --  0.6 0.8  BILIDIR  --  0.2  --   IBILI  --  0.4  --    Estimated Creatinine Clearance: 32.7 mL/min (A) (by C-G formula based on SCr of 1.14 mg/dL (H)).  Medical History: Past Medical History:  Diagnosis Date  . Alcoholic hepatitis   . Alcoholism ([place])   . Anemia   . Breast cancer ([place]) 2001   Rt side. Had lumpectomy, chemo and XRT by [facility]  . Hypertension   . Personal history of chemotherapy   . Personal history of radiation therapy   . Shortness of breath     Assessment: 59 yo female complaining of weakness and found to have electrolyte abnormalities. Pharmacy has been consulted to manage electrolyte repletion.   Today K 2.7 (now received 59meq KCl repletion since lab draw), Mg 1.5, Calcium 8.8 (corrected for hypoalbuminemia)  Goal of Therapy:  K > 4 Mg > 2 Ca > 8.5  Plan:  KCl 61meq IV x 4 Mg 2g IV x1 Check K 1 hour after completion of repletion, other electrolytes with AM labs   Brendolyn Patty, PharmD Clinical Pharmacist  05/21/2020   8:15 AM

## 2020-05-22 DIAGNOSIS — R531 Weakness: Secondary | ICD-10-CM | POA: Diagnosis not present

## 2020-05-22 LAB — BASIC METABOLIC PANEL
Anion gap: 6 (ref 5–15)
BUN: 14 mg/dL (ref 6–20)
CO2: 23 mmol/L (ref 22–32)
Calcium: 7.6 mg/dL — ABNORMAL LOW (ref 8.9–10.3)
Chloride: 107 mmol/L (ref 98–111)
Creatinine, Ser: 0.98 mg/dL (ref 0.44–1.00)
GFR, Estimated: >60 mL/min (ref >60)
Glucose, Bld: 74 mg/dL (ref 70–99)
Potassium: 4.5 mmol/L (ref 3.5–5.1)
Sodium: 136 mmol/L (ref 135–145)

## 2020-05-22 LAB — PHOSPHORUS: Phosphorus: 2.5 mg/dL (ref 2.5–4.6)

## 2020-05-22 LAB — OCCULT BLOOD X 1 CARD TO LAB, STOOL: Fecal Occult Bld: POSITIVE — AB

## 2020-05-22 LAB — MAGNESIUM: Magnesium: 1.5 mg/dL — ABNORMAL LOW (ref 1.7–2.4)

## 2020-05-22 MED ORDER — THIAMINE HCL 100 MG PO TABS: 50.0000 mg | ORAL_TABLET | Freq: Every day | ORAL | 0 refills | Status: AC

## 2020-05-22 MED ORDER — ADULT MULTIVITAMIN W/MINERALS CH: 1.0000 | ORAL_TABLET | Freq: Every day | ORAL | 0 refills | Status: AC

## 2020-05-22 MED ORDER — PANTOPRAZOLE SODIUM 40 MG PO TBEC: 40.0000 mg | DELAYED_RELEASE_TABLET | Freq: Two times a day (BID) | ORAL | 0 refills | Status: AC

## 2020-05-22 MED ORDER — HYDRALAZINE HCL 25 MG PO TABS
25.0000 mg | ORAL_TABLET | Freq: Two times a day (BID) | ORAL | Status: DC
  Administered 2020-05-22: 25 mg via ORAL
  Filled 2020-05-22: qty 1

## 2020-05-22 MED ORDER — AMLODIPINE BESYLATE 10 MG PO TABS
10.0000 mg | ORAL_TABLET | Freq: Every day | ORAL | Status: DC
  Administered 2020-05-22: 10 mg via ORAL
  Filled 2020-05-22: qty 1

## 2020-05-22 MED ORDER — ENSURE ENLIVE PO LIQD: 237.0000 mL | Freq: Two times a day (BID) | ORAL | 12 refills | Status: AC

## 2020-05-22 MED ORDER — PANTOPRAZOLE SODIUM 40 MG PO TBEC
40.0000 mg | DELAYED_RELEASE_TABLET | Freq: Two times a day (BID) | ORAL | Status: DC
  Administered 2020-05-22: 40 mg via ORAL
  Filled 2020-05-22: qty 1

## 2020-05-22 MED ORDER — AMLODIPINE BESYLATE 10 MG PO TABS: 10.0000 mg | ORAL_TABLET | Freq: Every day | ORAL | 0 refills | Status: AC

## 2020-05-22 MED ORDER — FOLIC ACID 1 MG PO TABS
1.0000 mg | ORAL_TABLET | Freq: Every day | ORAL | 0 refills | Status: DC
Start: 2020-05-23 — End: 2020-12-13

## 2020-05-22 MED ORDER — MAGNESIUM SULFATE 2 GM/50ML IV SOLN
2.0000 g | Freq: Once | INTRAVENOUS | Status: DC
  Filled 2020-05-22: qty 50

## 2020-05-22 MED ORDER — MAGNESIUM OXIDE 400 (241.3 MG) MG PO TABS: 400.0000 mg | ORAL_TABLET | Freq: Two times a day (BID) | ORAL | 0 refills | Status: AC

## 2020-05-22 MED ORDER — HYDRALAZINE HCL 25 MG PO TABS: 25.0000 mg | ORAL_TABLET | Freq: Two times a day (BID) | ORAL | 0 refills | Status: AC

## 2020-05-22 MED ORDER — MAGNESIUM OXIDE 400 (241.3 MG) MG PO TABS
400.0000 mg | ORAL_TABLET | Freq: Two times a day (BID) | ORAL | Status: DC
  Administered 2020-05-22: 400 mg via ORAL
  Filled 2020-05-22: qty 1

## 2020-05-22 MED ORDER — HYDRALAZINE HCL 20 MG/ML IJ SOLN: 10.0000 mg | INTRAMUSCULAR | Status: DC | PRN

## 2020-05-22 NOTE — Discharge Summary (Signed)
[facility] [place] NAME: [name]    MR#:  [id number]  [place] OF BIRTH:  1961/06/21  DATE OF ADMISSION:  05/20/2020 ADMITTING PHYSICIAN: Athena Masse, MD  DATE OF DISCHARGE: 05/22/2020  PRIMARY CARE PHYSICIAN: Marliss Coots, NP    ADMISSION DIAGNOSIS:  Hypokalemia [E87.6] Hypomagnesemia [E83.42] Symptomatic anemia [D64.9]  DISCHARGE DIAGNOSIS:  Anemia of chronic disease Ongoing alcohol abuse Electrolyte abnormality in the setting of drinking alcohol HTN-Uncontrolled  SECONDARY DIAGNOSIS:   Past Medical History:  Diagnosis Date   Alcoholic hepatitis    Alcoholism ([place])    Anemia    Breast cancer ([place]) 2001   Rt side. Had lumpectomy, chemo and XRT by [facility]   Hypertension    Personal history of chemotherapy    Personal history of radiation therapy    Shortness of breath     HOSPITAL COURSE:   59 year old female with history of hypertension, breast cancer status post chemo and XRT, and alcohol use disorder, as well as alcohol related microcytic anemia seen by hematology, Dr. Janese Banks in July 2021 with hemoglobin 6.6 who presented to the emergency room on the advice of her PCP, after outpatient labs done on 9/15 showed hypokalemia and anemia. Patient symptomatic for weakness, weight loss and nausea.  Generalized weakness -Suspect multifactorial. Patient has had weight loss for the past year, nausea with intermittent vomiting-- with poor PO intake suspected from alcohol abuse. -Treat etiologies as outlined below  Symptomatic anemiawith history of macrocytic anemia Anemia of chronic disease -Patient had a full hematology work-up in July by Dr. Janese Banks with final diagnosis microcytic anemia related to alcohol use -Hemoglobin 6.6, down from 9.8 when patient was evaluated by hematology -status post 2 unit blood transfusion --hemoglobin 8.6 -GI consult with Dr. Haig Prophet. Patient recently had EGD and colonoscopy in  September. -- EGD showed chronic gastritis. -- Colonoscopy showed poor prep. Polyps were excised. Appeared  benign -- no indication for any further G.I. workup at present  Hypomagnesemia Severe hypokalemia -Potassium less than 2, possibly in part related to thiazide diuretic/poor PO intake -Receiving oral and IV potassium repletion -- replete mag -- phosphorus 4.0  Alcohol dependence ([place]), moderate -Patient drinks a pint of liquor daily -Thiamine folate and multivitamin.IV thiamine given in the ER -CIWA withdrawal protocol-- patient has chronic tremors -- patient advised to abstain from drinking   HTN-uncontrolled -resume lisinopril. Added Norvasc and hydralazine. -Appeared discharge 156/96 -? Compliance to med  Nutrition Status: Nutrition Problem: Increased nutrient needs Etiology: other (see comment) (Pt is underweight) Signs/Symptoms: estimated needs Interventions: Ensure Enlive (each supplement provides 350kcal and 20 grams of protein), MVI, Liberalize Diet -Likely related to chronic alcohol use -Nutritionist consult appreciated -- patient has been eating appropriately in the hospital.  History of breast cancer -Status post chemo and radiation.  Physical therapy recommends PT. With her managed Medicaid outpatient PT will be set up per TOC  DVT prophylaxis:SCDs given severe anemia code Status:full code Family Communication:with sister Ms. Jerline Pain. Patient will be returning back home with Ms. Jerline Pain. Disposition Plan:Back to previous home environment Consults called:GI  Status is: Inpatient   Dispo: The patient is from: Home  Anticipated d/c is to: Home  Anticipated d/c date is: today  Patient currently is  medically stable and nearing baseline to d/c.  CONSULTS OBTAINED:  Treatment Team:  Lesly Rubenstein, MD  DRUG ALLERGIES:  No Known Allergies  DISCHARGE MEDICATIONS:   Allergies as of  05/22/2020   No  Known Allergies     Medication List    STOP taking these medications   hydrochlorothiazide 25 MG tablet Commonly known as: HYDRODIURIL     TAKE these medications   amLODipine 10 MG tablet Commonly known as: NORVASC Take 1 tablet (10 mg total) by mouth daily. Start taking on: May 23, 2020   feeding supplement (ENSURE ENLIVE) Liqd Take 237 mLs by mouth 2 (two) times daily between meals.   folic acid 1 MG tablet Commonly known as: FOLVITE Take 1 tablet (1 mg total) by mouth daily. Start taking on: May 23, 2020   hydrALAZINE 25 MG tablet Commonly known as: APRESOLINE Take 1 tablet (25 mg total) by mouth 2 (two) times daily.   Iron 325 (65 Fe) MG Tabs Take 1 tablet by mouth daily.   lisinopril 40 MG tablet Commonly known as: ZESTRIL Take 1 tablet (40 mg total) by mouth daily.   magnesium oxide 400 (241.3 Mg) MG tablet Commonly known as: MAG-OX Take 1 tablet (400 mg total) by mouth 2 (two) times daily.   meloxicam 7.5 MG tablet Commonly known as: MOBIC Take 7.5 mg by mouth 2 (two) times daily as needed.   multivitamin with minerals Tabs tablet Take 1 tablet by mouth daily. Start taking on: May 23, 2020   pantoprazole 40 MG tablet Commonly known as: PROTONIX Take 1 tablet (40 mg total) by mouth 2 (two) times daily before a meal.   thiamine 100 MG tablet Take 0.5 tablets (50 mg total) by mouth daily. Start taking on: May 23, 2020   Vitamin D (Ergocalciferol) 1.25 MG (50000 UNIT) Caps capsule Commonly known as: DRISDOL Take 50,000 Units by mouth every 7 (seven) days.       If you experience worsening of your admission symptoms, develop shortness of breath, life threatening emergency, suicidal or homicidal thoughts you must seek medical attention immediately by calling 911 or calling your MD immediately  if symptoms less severe.  You Must read complete instructions/literature along with all the possible adverse reactions/side  effects for all the Medicines you take and that have been prescribed to you. Take any new Medicines after you have completely understood and accept all the possible adverse reactions/side effects.   Please note  You were cared for by a hospitalist during your hospital stay. If you have any questions about your discharge medications or the care you received while you were in the hospital after you are discharged, you can call the unit and asked to speak with the hospitalist on call if the hospitalist that took care of you is not available. Once you are discharged, your primary care physician will handle any further medical issues. Please note that NO REFILLS for any discharge medications will be authorized once you are discharged, as it is imperative that you return to your primary care physician (or establish a relationship with a primary care physician if you do not have one) for your aftercare needs so that they can reassess your need for medications and monitor your lab values. Today   SUBJECTIVE   Eating breakfast. Doing overall well. Wondering when she can go home. No new complaints.  VITAL SIGNS:  Blood pressure (!) 151/96, pulse 89, temperature 98.5 F (36.9 C), temperature source Oral, resp. rate 17, height 5\' 5"  (1.651 m), weight 43 kg, SpO2 100 %.  I/O:    Intake/Output Summary (Last 24 hours) at 05/22/2020 1318 Last data filed at 05/22/2020 0900 Gross per 24 hour  Intake 130.31 ml  Output 2 ml  Net 128.31 ml    PHYSICAL EXAMINATION:  GENERAL:  59 y.o.-year-old patient lying in the bed with no acute distress. Thin frail HEENT: Head atraumatic, normocephalic. Oropharynx and nasopharynx clear.  LUNGS: Normal breath sounds bilaterally, no wheezing, rales,rhonchi or crepitation. No use of accessory muscles of respiration.  CARDIOVASCULAR: S1, S2 normal. No murmurs, rubs, or gallops.  ABDOMEN: Soft, non-tender, non-distended. Bowel sounds present. No organomegaly or mass.   EXTREMITIES: No pedal edema, cyanosis, or clubbing.  NEUROLOGIC: Cranial nerves II through XII are intact. Muscle strength 5/5 in all extremities. Sensation intact. Gait not checked.  PSYCHIATRIC: The patient is alert and oriented x 2 SKIN: No obvious rash, lesion, or ulcer.   DATA REVIEW:   CBC  Recent Labs  Lab 05/21/20 0541  WBC 5.2  HGB 8.6*  HCT 24.0*  PLT 283    Chemistries  Recent Labs  Lab 05/21/20 0541 05/21/20 1505 05/22/20 0427  NA 144   < > 136  K 2.7*   < > 4.5  CL 105   < > 107  CO2 23   < > 23  GLUCOSE 82   < > 74  BUN 16   < > 14  CREATININE 1.14*   < > 0.98  CALCIUM 7.3*   < > 7.6*  MG 1.5*  --  1.5*  AST 62*  --   --   ALT 17  --   --   ALKPHOS 96  --   --   BILITOT 0.8  --   --    < > = values in this interval not displayed.    Microbiology Results   Recent Results (from the past 240 hour(s))  Resp Panel by RT PCR (RSV, Flu A&B, Covid) - Nasopharyngeal Swab     Status: None   Collection Time: 05/20/20  8:23 PM   Specimen: Nasopharyngeal Swab  Result Value Ref Range Status   SARS Coronavirus 2 by RT PCR NEGATIVE NEGATIVE Final    Comment: (NOTE) SARS-CoV-2 target nucleic acids are NOT DETECTED.  The SARS-CoV-2 RNA is generally detectable in upper respiratoy specimens during the acute phase of infection. The lowest concentration of SARS-CoV-2 viral copies this assay can detect is 131 copies/mL. A negative result does not preclude SARS-Cov-2 infection and should not be used as the sole basis for treatment or other patient management decisions. A negative result may occur with  improper specimen collection/handling, submission of specimen other than nasopharyngeal swab, presence of viral mutation(s) within the areas targeted by this assay, and inadequate number of viral copies (<131 copies/mL). A negative result must be combined with clinical observations, patient history, and epidemiological information. The expected result is  Negative.  Fact Sheet for Patients:  PinkCheek.be  Fact Sheet for Healthcare Providers:  GravelBags.it  This test is no t yet approved or cleared by the [place] FDA and  has been authorized for detection and/or diagnosis of SARS-CoV-2 by FDA under an Emergency Use Authorization (EUA). This EUA will remain  in effect (meaning this test can be used) for the duration of the COVID-19 declaration under Section 564(b)(1) of the Act, 21 U.S.C. section 360bbb-3(b)(1), unless the authorization is terminated or revoked sooner.     Influenza A by PCR NEGATIVE NEGATIVE Final   Influenza B by PCR NEGATIVE NEGATIVE Final    Comment: (NOTE) The Xpert Xpress SARS-CoV-2/FLU/RSV assay is intended as an aid in  the diagnosis of influenza from Nasopharyngeal swab specimens and  should not be used as a sole basis for treatment. Nasal washings and  aspirates are unacceptable for Xpert Xpress SARS-CoV-2/FLU/RSV  testing.  Fact Sheet for Patients: PinkCheek.be  Fact Sheet for Healthcare Providers: GravelBags.it  This test is not yet approved or cleared by the [place] FDA and  has been authorized for detection and/or diagnosis of SARS-CoV-2 by  FDA under an Emergency Use Authorization (EUA). This EUA will remain  in effect (meaning this test can be used) for the duration of the  Covid-19 declaration under Section 564(b)(1) of the Act, 21  U.S.C. section 360bbb-3(b)(1), unless the authorization is  terminated or revoked.    Respiratory Syncytial Virus by PCR NEGATIVE NEGATIVE Final    Comment: (NOTE) Fact Sheet for Patients: PinkCheek.be  Fact Sheet for Healthcare Providers: GravelBags.it  This test is not yet approved or cleared by the [place] FDA and  has been authorized for detection and/or diagnosis of  SARS-CoV-2 by  FDA under an Emergency Use Authorization (EUA). This EUA will remain  in effect (meaning this test can be used) for the duration of the  COVID-19 declaration under Section 564(b)(1) of the Act, 21 U.S.C.  section 360bbb-3(b)(1), unless the authorization is terminated or  revoked. Performed at [facility], [address] [place], [place] [address]     RADIOLOGY:  No results found.   CODE STATUS:     Code Status Orders  (From admission, onward)         Start     Ordered   05/20/20 2037  Full code  Continuous        05/20/20 2039        Code Status History    Date Active Date Inactive Code Status Order ID Comments User Context   03/04/2012 1411 03/17/2012 2319 Full Code [id number]  Street, Sharon [place], MD Inpatient   07/15/2011 1541 07/16/2011 0524 Full Code [id number]  Orlie Dakin, PA-C ED   07/15/2011 1540 07/15/2011 1541 Full Code [id number]  Orlie Dakin, PA-C ED   Advance Care Planning Activity       TOTAL TIME TAKING CARE OF THIS PATIENT: *35* minutes.    Fritzi Mandes M.D  [facility]    CC: Primary care physician; Synthia Innocent Audrea Muscat, NP

## 2020-05-22 NOTE — TOC Transition Note (Addendum)
Transition of Care [facility]) - CM/SW Discharge Note   Patient Details  Name: [name] MRN: [id number] Date of Birth: Mar 30, 1961  [facility] CM/SW Contact:  Truitt Merle, LCSW Phone Number: 05/22/2020, 4:11 PM   Clinical Narrative:    Per Dr. Posey Pronto, patient medically ready for discharge home. P/T recommends home health P/T. LCSW attempted to contact managed Medicaid to inquire about home health. However, they are closed on weekends. Spoke with patient in reference to outpatient P/T, as well as contacting her managed Medicaid insurance to check benefits for home health services. Patient expressed understanding. Dr. Posey Pronto and RN updated via secure chat. Patient to be transported to sister's home by sister-Angela. TOC signing off as patient has no further needs.    Final next level of care: Home/Self Care Barriers to Discharge: Barriers Resolved   Patient Goals and CMS Choice Patient states their goals for this hospitalization and ongoing recovery are:: Return to sisters home with [facility].   Choice offered to / list presented to : NA  Discharge Placement                       Discharge Plan and Services In-house Referral: Clinical Social Work Discharge Planning Services: NA Post Acute Care Choice: Home Health          DME Arranged: N/A DME Agency: NA                  Social Determinants of Health (SDOH) Interventions     Readmission Risk Interventions No flowsheet data found.

## 2020-05-22 NOTE — Plan of Care (Signed)
  Problem: Education: Goal: Knowledge of General Education information will improve Description Including pain rating scale, medication(s)/side effects and non-pharmacologic comfort measures Outcome: Progressing   Problem: Activity: Goal: Risk for activity intolerance will decrease Outcome: Progressing   Problem: Safety: Goal: Ability to remain free from injury will improve Outcome: Progressing   

## 2020-05-22 NOTE — Progress Notes (Signed)
Patient's vitals WDL at time of discharge. Patient's sister was her pick up person whom she also lives with; Mrs. [name] Pain in [place]

## 2020-05-22 NOTE — Progress Notes (Signed)
MEDICATION RELATED CONSULT NOTE  Pharmacy Consult for electrolyte management  Indication: electrolyte derangement  No Known Allergies  Patient Measurements: Height: 5\' 5"  (165.1 cm) Weight: 43 kg (94 lb 12.8 oz) IBW/kg (Calculated) : 57  Labs: Recent Labs    05/20/20 1745 05/20/20 1745 05/20/20 2341 05/20/20 2341 05/21/20 0541 05/21/20 1505 05/22/20 0427  WBC 5.2  --   --   --  5.2  --   --   HGB 6.6*  --   --   --  8.6*  --   --   HCT 17.9*  --   --   --  24.0*  --   --   PLT 388  --   --   --  283  --   --   CREATININE 1.06*   < > 1.19*   < > 1.14* 1.01* 0.98  MG 1.0*   < > 1.7  --  1.5*  --  1.5*  PHOS  --   --   --   --  4.0  --  2.5  ALBUMIN  --   --  2.1*  --  2.1*  --   --   PROT  --   --  5.9*  --  5.7*  --   --   AST  --   --  51*  --  62*  --   --   ALT  --   --  16  --  17  --   --   ALKPHOS  --   --  86  --  96  --   --   BILITOT  --   --  0.6  --  0.8  --   --   BILIDIR  --   --  0.2  --   --   --   --   IBILI  --   --  0.4  --   --   --   --    < > = values in this interval not displayed.   Estimated Creatinine Clearance: 43 mL/min (by C-G formula based on SCr of 0.98 mg/dL).  Medical History: Past Medical History:  Diagnosis Date  . Alcoholic hepatitis   . Alcoholism ([place])   . Anemia   . Breast cancer ([place]) 2001   Rt side. Had lumpectomy, chemo and XRT by [facility]  . Hypertension   . Personal history of chemotherapy   . Personal history of radiation therapy   . Shortness of breath     Assessment: 59 yo female complaining of weakness and found to have electrolyte abnormalities. Pharmacy has been consulted to manage electrolyte repletion.    Goal of Therapy:  K > 4 Mg > 2 Ca > 8.5  Plan:  Mag 400 mg BID x 2 doses ordered by MD. Will add mag 2 g IV x 1 to supplement as magnesium remains low despite IV replacement yesterday.   Dorena Bodo, PharmD Clinical Pharmacist  05/22/2020   8:13 AM

## 2020-05-22 NOTE — Evaluation (Signed)
Physical Therapy Evaluation Patient Details Name: [name] MRN: [id number] DOB: [birth year] Today's Date: 05/22/2020   History of Present Illness  59 yo female with onset of weakness and detox from EtOH was admitted for followup care for her electrolyte changes and anemia.  Pt has PHx:  breast CA, HTN, EtOH Hepatitis, anemia, SOB, nausea, malnutrition,  Clinical Impression  Pt was seen for mobility on no AD, but with min guard for safety.  Has family at home at all times, and can maneuver with HHA on furniture and has [place] at home.  Stairs were accomplished, and should be better to follow up with HHPT for further strength and balance changes.  Mobility will be done while pt stays on and increase distances as able.      Follow Up Recommendations SNF    Equipment Recommendations  Rolling walker with 5" wheels    Recommendations for Other Services       Precautions / Restrictions Precautions Precautions: Fall Precaution Comments: ck sats and pulses Restrictions Weight Bearing Restrictions: No      Mobility  Bed Mobility Overal bed mobility: Needs Assistance Bed Mobility: Supine to Sit     Supine to sit: Min guard     General bed mobility comments: pt is able to take steps and recover balance with min guard assist  Transfers Overall transfer level: Needs assistance Equipment used: 1 person hand held assist Transfers: Sit to/from Stand Sit to Stand: Min guard         General transfer comment: able to stand with extra time  Ambulation/Gait Ambulation/Gait assistance: Min guard Gait Distance (Feet): 300 Feet Assistive device: 1 person hand held assist Gait Pattern/deviations: Step-through pattern;Step-to pattern;Decreased stride length;Narrow base of support;Drifts right/left Gait velocity: reduced   General Gait Details: pt is shuffling nearly wiht steps, able to maintain her balance on RW  Stairs Stairs: Yes Stairs assistance: Min guard Stair Management:  One rail Right;One rail Left;Alternating pattern;Step to pattern Number of Stairs: 4 General stair comments: performed stairs to enter house and to manage home but does not have to go up stairs to upstairs.  Wheelchair Mobility    Modified Rankin (Stroke Patients Only)       Balance Overall balance assessment: Needs assistance Sitting-balance support: Bilateral upper extremity supported;Feet supported Sitting balance-Leahy Scale: Good     Standing balance support: No upper extremity supported Standing balance-Leahy Scale: Fair                               Pertinent Vitals/Pain      Home Living Family/patient expects to be discharged to:: Private residence Living Arrangements: Other relatives Available Help at Discharge: Family;Available 24 hours/day Type of Home: House Home Access: Level entry     Home Layout: One level Home Equipment: Cane - single point;Walker - 2 wheels      Prior Function Level of Independence: Independent with assistive device(s);Independent         Comments: has not needed AD's     Hand Dominance   Dominant Hand: Right    Extremity/Trunk Assessment   Upper Extremity Assessment Upper Extremity Assessment: Overall WFL for tasks assessed    Lower Extremity Assessment Lower Extremity Assessment: Generalized weakness    Cervical / Trunk Assessment Cervical / Trunk Assessment: Normal  Communication   Communication: No difficulties  Cognition Arousal/Alertness: Awake/alert Behavior During Therapy: Flat affect Overall Cognitive Status: Within Functional Limits for tasks assessed  General Comments General comments (skin integrity, edema, etc.): pt is up to walk and noted her struggle with controlling balance in a small time frame, but is overall able to avoid having to reach for objects    Exercises     Assessment/Plan    PT Assessment Patient needs continued  PT services  PT Problem List Decreased strength;Decreased range of motion;Decreased activity tolerance;Decreased balance;Decreased mobility;Decreased coordination;Decreased cognition;Decreased knowledge of use of DME;Decreased safety awareness       PT Treatment Interventions DME instruction;Gait training;Stair training;Functional mobility training;Therapeutic activities;Therapeutic exercise;Balance training;Neuromuscular re-education;Patient/family education    PT Goals (Current goals can be found in the Care Plan section)  Acute Rehab PT Goals Patient Stated Goal: to walk and recover her balance and endurance PT Goal Formulation: With patient Time For Goal Achievement: 06/05/20 Potential to Achieve Goals: Good    Frequency Min 2X/week   Barriers to discharge Inaccessible home environment home with family who can help with stairs as is needed    Co-evaluation               AM-PAC PT "6 Clicks" Mobility  Outcome Measure Help needed turning from your back to your side while in a flat bed without using bedrails?: None Help needed moving from lying on your back to sitting on the side of a flat bed without using bedrails?: A Little Help needed moving to and from a bed to a chair (including a wheelchair)?: A Little Help needed standing up from a chair using your arms (e.g., wheelchair or bedside chair)?: A Little Help needed to walk in hospital room?: A Little Help needed climbing 3-5 steps with a railing? : A Lot 6 Click Score: 18    End of Session Equipment Utilized During Treatment: Gait belt Activity Tolerance: Patient tolerated treatment well;Patient limited by fatigue Patient left: in chair;with call bell/phone within reach;with chair alarm set Nurse Communication: Mobility status PT Visit Diagnosis: Unsteadiness on feet (R26.81);Muscle weakness (generalized) (M62.81);Difficulty in walking, not elsewhere classified (R26.2)    Time: 5784-6962 PT Time Calculation (min)  (ACUTE ONLY): 23 min   Charges:   PT Evaluation $PT Eval Moderate Complexity: 1 Mod PT Treatments $Gait Training: 8-22 mins       Ramond Dial 05/22/2020, 1:46 PM  Mee Hives, PT MS Acute Rehab Dept. Number: [place] [facility] [place]

## 2020-05-22 NOTE — Discharge Instructions (Signed)
Patient advised to abstain from drinking alcohol

## 2020-05-23 LAB — TYPE AND SCREEN
ABO/RH(D): O POS
Antibody Screen: POSITIVE
DAT, IgG: NEGATIVE
DAT, complement: NEGATIVE
Unit division: 0
Unit division: 0
Unit division: 0
Unit division: 0

## 2020-05-23 LAB — BPAM RBC
Blood Product Expiration Date: 202110212359
Blood Product Expiration Date: 202111022359
Blood Product Expiration Date: 202111082359
Blood Product Expiration Date: 202111082359
ISSUE DATE / TIME: 202110061428
ISSUE DATE / TIME: 202110080054
Unit Type and Rh: 5100
Unit Type and Rh: 5100
Unit Type and Rh: 5100
Unit Type and Rh: 5100

## 2020-05-27 ENCOUNTER — Other Ambulatory Visit: Payer: Self-pay

## 2020-05-27 ENCOUNTER — Ambulatory Visit: Payer: Medicaid Other | Attending: Physician Assistant | Admitting: Physical Therapy

## 2020-05-27 DIAGNOSIS — M25561 Pain in right knee: Secondary | ICD-10-CM | POA: Insufficient documentation

## 2020-05-27 DIAGNOSIS — R293 Abnormal posture: Secondary | ICD-10-CM | POA: Diagnosis present

## 2020-05-27 DIAGNOSIS — R262 Difficulty in walking, not elsewhere classified: Secondary | ICD-10-CM | POA: Insufficient documentation

## 2020-05-27 DIAGNOSIS — M542 Cervicalgia: Secondary | ICD-10-CM | POA: Insufficient documentation

## 2020-05-27 DIAGNOSIS — M6281 Muscle weakness (generalized): Secondary | ICD-10-CM | POA: Insufficient documentation

## 2020-05-27 DIAGNOSIS — M25562 Pain in left knee: Secondary | ICD-10-CM | POA: Diagnosis present

## 2020-05-27 DIAGNOSIS — G8929 Other chronic pain: Secondary | ICD-10-CM | POA: Insufficient documentation

## 2020-05-27 NOTE — Therapy (Signed)
[place], [place], [address] Phone: [phone]   Fax:  [phone]  Physical Therapy Evaluation  Patient Details  Name: [name] MRN: [id number] Date of Birth: 12-02-2010 Referring Provider (PT): August Luz A PA   Encounter Date: 05/27/2020   PT End of Session - 05/27/20 0800    Visit Number 1    Number of Visits 16    Date for PT Re-Evaluation 07/22/20    Authorization Time Period [employer] MCD    PT Start Time 0800    PT Stop Time 0844    PT Time Calculation (min) 44 min    Activity Tolerance Patient tolerated treatment well;Patient limited by fatigue    Behavior During Therapy Flat affect           Past Medical History:  Diagnosis Date  . Alcoholic hepatitis   . Alcoholism ([place])   . Anemia   . Breast cancer ([place]) 2001   Rt side. Had lumpectomy, chemo and XRT by [facility]  . Hypertension   . Personal history of chemotherapy   . Personal history of radiation therapy   . Shortness of breath     Past Surgical History:  Procedure Laterality Date  . BREAST LUMPECTOMY Right 2001  . rt lumpectomy  2001    There were no vitals filed for this visit.    Subjective Assessment - 05/27/20 0809    Subjective Pt was living in apartment alone in motel and sister believes she fell a couple of times, Ms Simmon does not know how she hurt her neck  but she went to the hospital because of low potassium and stay a couple of days and discharged 05-20-20 .  Neck was strained and she was weak.  My main concerns are my neck and my two legs because of weakness. and bil swelling of knees.    Patient is accompained by: Family member   sister [name]   Pertinent History See medical Chart  Breast CA , HTN  ETOH Hepatitis, SOB, malnutrition    How long can you sit comfortably? N/A    How long can you stand comfortably? I can stand for a while but intially I hurt in both knees.  I have a walker but I dont use it     How long can you walk comfortably? does not walk for exercise and depends on sister    Patient Stated Goals I want to get stronger    Currently in Pain? Yes    Pain Score 8     Pain Location Neck    Pain Orientation Right;Left    Pain Descriptors / Indicators Aching;Spasm    Pain Type Acute pain    Pain Onset 1 to 4 weeks ago    Pain Frequency Intermittent    Aggravating Factors  neck pain keeps me up and leg pain,  sleeping.    Multiple Pain Sites Yes    Pain Score 9    Pain Location Knee   and bil ankles   Pain Orientation Right;Left    Pain Descriptors / Indicators Aching    Pain Type Chronic pain    Pain Onset More than a month ago    Pain Frequency Constant    Aggravating Factors  stairs  , rising from chair.  household chores takes time              [facility] PT Assessment - 05/27/20 0001  Assessment   Medical Diagnosis strain of mx, fascia, tendon at neck level    Referring Provider (PT) August Luz A PA    Onset Date/Surgical Date 03/27/20   neck pain 1 month and potassium low 05/20/20   Hand Dominance Right    Prior Therapy None      Precautions   Precautions Fall      Restrictions   Weight Bearing Restrictions No      Balance Screen   Has the patient fallen in the past 6 months Yes    How many times? 3   leg weakness   Has the patient had a decrease in activity level because of a fear of falling?  Yes    Is the patient reluctant to leave their home because of a fear of falling?  Yes      Home Environment   Living Environment Private residence    Living Arrangements Other relatives   sister right now .. was in motel alone   Available Help at Discharge Family    Type of [place] to enter    Entrance Stairs-Number of Steps 4    Entrance Stairs-Rails Can reach both    [place] Two level    Additional Comments pt stays on level floor      Prior Function   Level of Independence Needs assistance with homemaking       Cognition   Overall Cognitive Status Within Functional Limits for tasks assessed      Observation/Other Assessments   Focus on Therapeutic Outcomes (FOTO)  MCD NA      Functional Tests   Functional tests Sit to Stand      Squat   Comments unable to squat without pain or without bil UE support      Sit to Stand   Comments 30 sec 9 x MUST use chair arms.  Unable to do 5 x STS      Posture/Postural Control   Posture/Postural Control Postural limitations    Postural Limitations Rounded Shoulders;Forward head    Posture Comments Pt appears malnourished  and sarcopenic left clavicle elevated  near sternum      AROM   Overall AROM  Deficits    Right Shoulder Flexion 135 Degrees    Right Shoulder ABduction 131 Degrees    Left Shoulder Flexion 133 Degrees    Left Shoulder ABduction 129 Degrees    Right Hip Flexion 120    Left Hip Flexion 120    Right Knee Extension 0    Right Knee Flexion 133    Left Knee Extension 0    Left Knee Flexion 124   pain   Cervical Flexion 55    Cervical Extension 25   painful   Cervical - Right Side Bend 23    Cervical - Left Side Bend 18    Cervical - Right Rotation 20    Cervical - Left Rotation 25      Strength   Overall Strength Deficits    Right Shoulder Flexion 4-/5    Right Shoulder ABduction 4-/5    Right Shoulder Internal Rotation 4-/5    Right Shoulder External Rotation 4-/5    Left Shoulder Flexion 4-/5    Left Shoulder ABduction 4-/5    Left Shoulder Internal Rotation 4-/5    Left Shoulder External Rotation 4-/5    Right Hand Grip (lbs) 12    Left Hand Grip (lbs) 25  Palpation   Palpation comment tenderness over cervical paraspinal.  Pt with pain over bil joint line of knees      Ambulation/Gait   Ambulation/Gait Yes    Gait Pattern Decreased stride length;Decreased hip/knee flexion - right;Decreased hip/knee flexion - left;Step-to pattern    Gait velocity 2.25ft/sec     Stairs Yes    Stair Management Technique Two  rails;Step to pattern    Number of Stairs 4   up and down with CGA   Height of Stairs 6                      Objective measurements completed on examination: See above findings.               PT Education - 05/27/20 1212    Education Details POC explanation of findings  intiial HEP    Person(s) Educated Patient;Other (comment)   sister   Methods Explanation;Demonstration;Tactile cues;Verbal cues;Handout    Comprehension Verbalized understanding;Returned demonstration            PT Short Term Goals - 05/27/20 0845      PT SHORT TERM GOAL #1   Title Pt will be able to rise from chair without using hands    Baseline unable to rise presently   dependent onUE support    Time 3    Period Weeks    Status New    Target Date 06/17/20      PT SHORT TERM GOAL #2   Title Pt will be able to state at least 3 strategies for fall prevention    Baseline unaware of fall prevention strategies    Time 3    Period Weeks    Status New    Target Date 06/17/20      PT SHORT TERM GOAL #3   Title Pt will be gait trained utilizing best AD for prevention of falls as needed    Baseline has rolling walker at homw    Time 3    Period Weeks    Status New    Target Date 06/17/20             PT Long Term Goals - 05/27/20 0844      PT LONG TERM GOAL #1   Title Pt will be independent with advanced HEP given    Baseline no knowledge    Time 8    Period Weeks    Status New    Target Date 07/22/20      PT LONG TERM GOAL #2   Title Pt will be able to rise from floor to standing independently to decrease risk /fear of falling    Baseline unable to do floor to stand x fer    Time 8    Period Weeks    Status New    Target Date 07/22/20      PT LONG TERM GOAL #3   Title Pt will improve L/R LE strength to >/= 4/5 with </= 2/10 pain to promote safety with walking/standing activities    Baseline Pt has had 3 known falls in past 6 months   Pain in neck 6/10 and LE  8/10      Time 8    Period Weeks    Status New    Target Date 07/22/20      PT LONG TERM GOAL #4   Title Pt will be able to negotiate steps and carry 25 # of groceries    Baseline  Pt requires extra time and must use bil UE support to ascend /descend steps one step a time safely    Time 8    Period Weeks    Status New    Target Date 07/22/20      PT LONG TERM GOAL #5   Title Pt will be able to turn neck  without pain or waking at night    Baseline Pt has difficulty sleeping and wakes every 2 to 3 hours due to pain    Time 8    Period Weeks    Status New    Target Date 07/22/20             PT reviewd below exercises with sister and patient  Access Code: [email or web address]: [email or web address]: 10/14/2021Prepared by: Donnetta Simpers BeardsleyExercises  Supine Deep Neck Flexor Training - 2-3 x daily - 7 x weekly - 10 reps - 3 hold  Seated Cervical Retraction Protraction AROM - 2-3 x daily - 7 x weekly - 1 sets - 10 reps  Seated Gentle Upper Trapezius Stretch - 1 x daily - 7 x weekly - 1 sets - 3 reps - 30 hold  Gentle Levator Scapulae Stretch - 1 x daily - 7 x weekly - 3 sets - 3 reps - 30 hold  Squat with Counter Support - 1 x daily - 7 x weekly - 3 sets - 10 reps    Plan - 05/27/20 1140    Clinical Impression Statement 59 yo recently hospitalized for electrolyte imbalance/ low potassium has history of falls (at least 3 noted)  Pt presents with sarcopenic appearance and malnourished body and evident deconditioning  MD sent pt here for neck pain/strain from possible unwitnessed fall and continuing neck pain which pt/sister states has improved. Pt ambulates with decreased stride length and decreased hip flexion and knee flexion  Gait Velocity 2.21 ft/sec. Pt will benefit from skilled PT  2 x a week for 8 weeks to address impairments resulting from fall and deconditioning. from pain, weakness, difficulty walking , neck pain, difficulty ascending /descending stairs and has a slowed gait .     Personal Factors and Comorbidities Comorbidity 1;Comorbidity 2    Comorbidities See medical Chart  Breast CA , HTN  ETOH Hepatitis, SOB, malnutrition    Examination-Activity Limitations Transfers;Stand;Stairs;Locomotion Level;Squat    Examination-Participation Restrictions Laundry;Cleaning    Stability/Clinical Decision Making Evolving/Moderate complexity    Clinical Decision Making Moderate    Rehab Potential Good    PT Frequency 2x / week    PT Duration 8 weeks    PT Treatment/Interventions ADLs/Self Care Home Management;Aquatic Therapy;Cryotherapy;Electrical Stimulation;Iontophoresis 4mg /ml Dexamethasone;Traction;Moist Heat;Ultrasound;Balance training;Therapeutic exercise;Therapeutic activities;Functional mobility training;Stair training;Gait training;Neuromuscular re-education;Patient/family education;Manual techniques;Passive range of motion;Dry needling;Taping;Joint Manipulations;Spinal Manipulations    PT Next Visit Plan DEconditioned.  Neck strength.  pectoral stretch    PT Home Exercise Plan [email or web address]    Consulted and Agree with Plan of Care Patient;Family member/caregiver           Patient will benefit from skilled therapeutic intervention in order to improve the following deficits and impairments:  Pain, Postural dysfunction, Improper body mechanics, Impaired UE functional use, Impaired flexibility, Difficulty walking, Decreased mobility, Decreased range of motion, Decreased activity tolerance, Decreased balance  Visit Diagnosis: Cervicalgia  Muscle weakness (generalized)  Abnormal posture  Chronic pain of left knee  Chronic pain of right knee  Difficulty in walking, not elsewhere classified     Problem List Patient Active Problem List   Diagnosis Date Noted  .  Generalized weakness 05/20/2020  . Protein calorie malnutrition ([place]) 05/20/2020  . History of breast cancer 05/20/2020  . Cough 06/30/2012  . Heme + stool 03/15/2012  . Symptomatic anemia 03/15/2012   . Necrotizing pneumonia ([place]) 03/06/2012  . Cholelithiasis 03/01/2012  . Abnormal finding on GI tract imaging 03/01/2012  . Occasional numbness/prickling/tingling of fingers and toes 12/15/2011  . Hypokalemia 12/15/2011  . Hypomagnesemia 08/06/2011  . Unspecified episodic mood disorder 07/24/2011  . Tobacco abuse 11/03/2010  . HYPERTRIGLYCERIDEMIA 01/25/2009  . H/O macrocytic anemia 10/30/2007  . Alcohol dependence ([place]) 10/11/2006  . NEUROPATHY, PERIPHERAL 10/11/2006  . HYPERTENSION, BENIGN SYSTEMIC 10/11/2006   Voncille Lo, PT, [place] Certified Exercise Expert for the Aging Adult  05/27/20 12:23 PM Phone: [phone] Fax: [facility] [address] [place], [place], [address] Phone: [phone]   Fax:  [phone]  Name: YAQUELIN LANGELIER MRN: [id number] Date of Birth: Jan 30, 1961   Check all possible CPT codes:      []  97110 (Therapeutic Exercise)  []  92507 (SLP Treatment)  []  97112 (Neuro Re-ed)   []  92526 (Swallowing Treatment)   []  97116 (Gait Training)   []  [phone] (Cognitive Training, 1st 15 minutes) []  97140 (Manual Therapy)   []  97130 (Cognitive Training, each add'l 15 minutes)  []  97530 (Therapeutic Activities)  []  Other, List CPT Code ____________    []  03888 (Self Care)       [x]  All codes above (97110 - 97535)  []  97012 (Mechanical Traction)  [x]  97014 (E-stim Unattended)  []  97032 (E-stim manual)  [x]  97033 (Ionto)  [x]  97035 (Ultrasound)  []  97016 (Vaso)  []  97760 (Orthotic Fit) []  [id number] (Prosthetic Training) []  [id number] (Physical Performance Training) []  [id number] (Aquatic Therapy) []  [id number] (Canalith Repositioning) []  [id number] (Contrast Bath) []  [id number] (Paraffin) []  97597 (Wound Care 1st 20 sq cm) []  97598 (Wound Care each add'l 20 sq cm)

## 2020-05-27 NOTE — Patient Instructions (Addendum)

## 2020-06-04 ENCOUNTER — Ambulatory Visit: Payer: Medicaid Other | Admitting: Physical Therapy

## 2020-06-04 ENCOUNTER — Encounter: Payer: Self-pay | Admitting: Physical Therapy

## 2020-06-04 ENCOUNTER — Other Ambulatory Visit: Payer: Self-pay

## 2020-06-04 DIAGNOSIS — M6281 Muscle weakness (generalized): Secondary | ICD-10-CM

## 2020-06-04 DIAGNOSIS — M25562 Pain in left knee: Secondary | ICD-10-CM

## 2020-06-04 DIAGNOSIS — M542 Cervicalgia: Secondary | ICD-10-CM | POA: Diagnosis not present

## 2020-06-04 DIAGNOSIS — R262 Difficulty in walking, not elsewhere classified: Secondary | ICD-10-CM

## 2020-06-04 DIAGNOSIS — M25561 Pain in right knee: Secondary | ICD-10-CM

## 2020-06-04 DIAGNOSIS — G8929 Other chronic pain: Secondary | ICD-10-CM

## 2020-06-04 DIAGNOSIS — R293 Abnormal posture: Secondary | ICD-10-CM

## 2020-06-04 NOTE — Therapy (Signed)
[place], [place], [address] Phone: [phone]   Fax:  [phone]  Physical Therapy Treatment  Patient Details  Name: [name] MRN: [id number] Date of Birth: 1961-03-24 Referring Provider (PT): August Luz A PA   Encounter Date: 06/04/2020   PT End of Session - 06/04/20 0828    Visit Number 2    Number of Visits 16    Date for PT Re-Evaluation 07/22/20    Authorization Time Period UHC MCD    PT Start Time 0800    PT Stop Time 0840    PT Time Calculation (min) 40 min    Activity Tolerance Patient tolerated treatment well;Patient limited by fatigue           Past Medical History:  Diagnosis Date  . Alcoholic hepatitis   . Alcoholism ([place])   . Anemia   . Breast cancer ([place]) 2001   Rt side. Had lumpectomy, chemo and XRT by [facility]  . Hypertension   . Personal history of chemotherapy   . Personal history of radiation therapy   . Shortness of breath     Past Surgical History:  Procedure Laterality Date  . BREAST LUMPECTOMY Right 2001  . rt lumpectomy  2001    There were no vitals filed for this visit.   Subjective Assessment - 06/04/20 0806    Subjective No neck pain today but I had some yesterday. And also I had some back pan yesterday.    Pertinent History See medical Chart  Breast CA , HTN  ETOH Hepatitis, SOB, malnutrition                             [facility] Adult PT Treatment/Exercise - 06/04/20 0001      Neck Exercises: Machines for Strengthening   Nustep L3 x 6 minutes UE/LE       Neck Exercises: Standing   Other Standing Exercises counter squats per HEP- needs chair behind to perform correctly      Neck Exercises: Seated   Other Seated Exercise sit-stand with UE to rise and cues to control descent       Neck Exercises: Supine   Neck Retraction 10 reps    Other Supine Exercise supine 1# UE dumbbells : chest press , elbows in then out x 10 each , Alt  UE flex/extension x 20  , 1# tricep press x 10 each       Neck Exercises: Stretches   Upper Trapezius Stretch 5 reps    Upper Trapezius Stretch Limitations 10 sec    Levator Stretch 5 reps    Levator Stretch Limitations 10 sec    Other Neck Stretches door way pec stretch  10 sec x 4                     PT Short Term Goals - 05/27/20 0845      PT SHORT TERM GOAL #1   Title Pt will be able to rise from chair without using hands    Baseline unable to rise presently   dependent onUE support    Time 3    Period Weeks    Status New    Target Date 06/17/20      PT SHORT TERM GOAL #2   Title Pt will be able to state at least 3 strategies for fall prevention    Baseline unaware of fall prevention strategies  Time 3    Period Weeks    Status New    Target Date 06/17/20      PT SHORT TERM GOAL #3   Title Pt will be gait trained utilizing best AD for prevention of falls as needed    Baseline has rolling walker at homw    Time 3    Period Weeks    Status New    Target Date 06/17/20             PT Long Term Goals - 05/27/20 0844      PT LONG TERM GOAL #1   Title Pt will be independent with advanced HEP given    Baseline no knowledge    Time 8    Period Weeks    Status New    Target Date 07/22/20      PT LONG TERM GOAL #2   Title Pt will be able to rise from floor to standing independently to decrease risk /fear of falling    Baseline unable to do floor to stand x fer    Time 8    Period Weeks    Status New    Target Date 07/22/20      PT LONG TERM GOAL #3   Title Pt will improve L/R LE strength to >/= 4/5 with </= 2/10 pain to promote safety with walking/standing activities    Baseline Pt has had 3 known falls in past 6 months   Pain in neck 6/10 and LE  8/10    Time 8    Period Weeks    Status New    Target Date 07/22/20      PT LONG TERM GOAL #4   Title Pt will be able to negotiate steps and carry 25 # of groceries    Baseline Pt requires extra  time and must use bil UE support to ascend /descend steps one step a time safely    Time 8    Period Weeks    Status New    Target Date 07/22/20      PT LONG TERM GOAL #5   Title Pt will be able to turn neck  without pain or waking at night    Baseline Pt has difficulty sleeping and wakes every 2 to 3 hours due to pain    Time 8    Period Weeks    Status New    Target Date 07/22/20                 Plan - 06/04/20 0809    Clinical Impression Statement Pt arrives without pain today and ambulating without AD.  Reviewed HEP and she requires mod cues for correct technique and maintaining erect posture. Began Nustep and patient reports she likes the machine. Began supine UE strength with dumbells,Theraband is difficult for her to grip. She reported feeling good at end of session.    Personal Factors and Comorbidities Comorbidity 1;Comorbidity 2    Comorbidities See medical Chart  Breast CA , HTN  ETOH Hepatitis, SOB, malnutrition    Examination-Participation Restrictions Laundry;Cleaning    PT Treatment/Interventions ADLs/Self Care Home Management;Aquatic Therapy;Cryotherapy;Electrical Stimulation;Iontophoresis 4mg /ml Dexamethasone;Traction;Moist Heat;Ultrasound;Balance training;Therapeutic exercise;Therapeutic activities;Functional mobility training;Stair training;Gait training;Neuromuscular re-education;Patient/family education;Manual techniques;Passive range of motion;Dry needling;Taping;Joint Manipulations;Spinal Manipulations    PT Next Visit Plan DEconditioned.  Neck strength.  pectoral stretch    PT Home Exercise Plan [email or web address]    Consulted and Agree with Plan of Care Patient  Patient will benefit from skilled therapeutic intervention in order to improve the following deficits and impairments:  Pain, Postural dysfunction, Improper body mechanics, Impaired UE functional use, Impaired flexibility, Difficulty walking, Decreased mobility, Decreased range of motion, Decreased  activity tolerance, Decreased balance  Visit Diagnosis: Cervicalgia  Muscle weakness (generalized)  Abnormal posture  Chronic pain of left knee  Chronic pain of right knee  Difficulty in walking, not elsewhere classified     Problem List Patient Active Problem List   Diagnosis Date Noted  . Generalized weakness 05/20/2020  . Protein calorie malnutrition ([place]) 05/20/2020  . History of breast cancer 05/20/2020  . Cough 06/30/2012  . Heme + stool 03/15/2012  . Symptomatic anemia 03/15/2012  . Necrotizing pneumonia ([place]) 03/06/2012  . Cholelithiasis 03/01/2012  . Abnormal finding on GI tract imaging 03/01/2012  . Occasional numbness/prickling/tingling of fingers and toes 12/15/2011  . Hypokalemia 12/15/2011  . Hypomagnesemia 08/06/2011  . Unspecified episodic mood disorder 07/24/2011  . Tobacco abuse 11/03/2010  . HYPERTRIGLYCERIDEMIA 01/25/2009  . H/O macrocytic anemia 10/30/2007  . Alcohol dependence ([place]) 10/11/2006  . NEUROPATHY, PERIPHERAL 10/11/2006  . HYPERTENSION, BENIGN SYSTEMIC 10/11/2006    Dorene Ar, PTA 06/04/2020, 9:29 AM  [place], [place], [address] Phone: [phone]   Fax:  [phone]  Name: [name] MRN: [id number] Date of Birth: October 31, 1960

## 2020-06-07 ENCOUNTER — Inpatient Hospital Stay: Payer: Medicaid Other

## 2020-06-07 ENCOUNTER — Inpatient Hospital Stay: Payer: Medicaid Other | Admitting: Oncology

## 2020-06-09 ENCOUNTER — Encounter: Payer: Self-pay | Admitting: Physical Therapy

## 2020-06-09 ENCOUNTER — Ambulatory Visit: Payer: Medicaid Other | Admitting: Physical Therapy

## 2020-06-09 ENCOUNTER — Other Ambulatory Visit: Payer: Self-pay

## 2020-06-09 DIAGNOSIS — M542 Cervicalgia: Secondary | ICD-10-CM | POA: Diagnosis not present

## 2020-06-09 DIAGNOSIS — R293 Abnormal posture: Secondary | ICD-10-CM

## 2020-06-09 DIAGNOSIS — R262 Difficulty in walking, not elsewhere classified: Secondary | ICD-10-CM

## 2020-06-09 DIAGNOSIS — M25562 Pain in left knee: Secondary | ICD-10-CM

## 2020-06-09 DIAGNOSIS — M6281 Muscle weakness (generalized): Secondary | ICD-10-CM

## 2020-06-09 DIAGNOSIS — G8929 Other chronic pain: Secondary | ICD-10-CM

## 2020-06-09 NOTE — Therapy (Signed)
[place], [place], [address] Phone: [phone]   Fax:  [phone]  Physical Therapy Treatment  Patient Details  Name: [name] MRN: [id number] Date of Birth: [birth year] Referring Provider (PT): August Luz A PA   Encounter Date: 06/09/2020   PT End of Session - 06/09/20 0816    Visit Number 3    Number of Visits 16    Date for PT Re-Evaluation 07/22/20    Authorization Time Period [facility] MCD    PT Start Time 4097    PT Stop Time 0843    PT Time Calculation (min) 48 min           Past Medical History:  Diagnosis Date  . Alcoholic hepatitis   . Alcoholism ([place])   . Anemia   . Breast cancer ([place]) 2001   Rt side. Had lumpectomy, chemo and XRT by [facility]  . Hypertension   . Personal history of chemotherapy   . Personal history of radiation therapy   . Shortness of breath     Past Surgical History:  Procedure Laterality Date  . BREAST LUMPECTOMY Right 2001  . rt lumpectomy  2001    There were no vitals filed for this visit.   Subjective Assessment - 06/09/20 0815    Subjective Pt reports no pain and no soreness after last session. Had some pain with sitting up in the bed yesterday.    Pertinent History See medical Chart  Breast CA , HTN  ETOH Hepatitis, SOB, malnutrition    Currently in Pain? No/denies                             [facility] Adult PT Treatment/Exercise - 06/09/20 0001      Neck Exercises: Machines for Strengthening   Nustep L2 x 10 minutes     cues for posture      Neck Exercises: Standing   Other Standing Exercises stairs 6 inch steps 4 x 2 -bilat UE, then 6 inch step up x 10 each -     Other Standing Exercises 10 x 2 counter squats per HEP- needs chair behind to perform correctly      Neck Exercises: Seated   X to V 10 reps    W Back 10 reps    Other Seated Exercise sit-stand without UE , cues for controlled descent       Neck Exercises: Supine    Neck Retraction 10 reps    Neck Retraction Limitations towel     Other Supine Exercise supine 2# UE dumbbells : chest press , elbows in then out x 10 each , Alt UE flex/extension x 20  , 2# tricep press x 20 each    with 1 pillow support      Neck Exercises: Stretches   Other Neck Stretches door way pec stretch  10 sec x 4                   PT Education - 06/09/20 0845    Education Details HEP    Person(s) Educated Patient    Methods Explanation;Handout    Comprehension Verbalized understanding            PT Short Term Goals - 05/27/20 0845      PT SHORT TERM GOAL #1   Title Pt will be able to rise from chair without using hands    Baseline unable to  rise presently   dependent onUE support    Time 3    Period Weeks    Status New    Target Date 06/17/20      PT SHORT TERM GOAL #2   Title Pt will be able to state at least 3 strategies for fall prevention    Baseline unaware of fall prevention strategies    Time 3    Period Weeks    Status New    Target Date 06/17/20      PT SHORT TERM GOAL #3   Title Pt will be gait trained utilizing best AD for prevention of falls as needed    Baseline has rolling walker at homw    Time 3    Period Weeks    Status New    Target Date 06/17/20             PT Long Term Goals - 05/27/20 0844      PT LONG TERM GOAL #1   Title Pt will be independent with advanced HEP given    Baseline no knowledge    Time 8    Period Weeks    Status New    Target Date 07/22/20      PT LONG TERM GOAL #2   Title Pt will be able to rise from floor to standing independently to decrease risk /fear of falling    Baseline unable to do floor to stand x fer    Time 8    Period Weeks    Status New    Target Date 07/22/20      PT LONG TERM GOAL #3   Title Pt will improve L/R LE strength to >/= 4/5 with </= 2/10 pain to promote safety with walking/standing activities    Baseline Pt has had 3 known falls in past 6 months   Pain in neck  6/10 and LE  8/10    Time 8    Period Weeks    Status New    Target Date 07/22/20      PT LONG TERM GOAL #4   Title Pt will be able to negotiate steps and carry 25 # of groceries    Baseline Pt requires extra time and must use bil UE support to ascend /descend steps one step a time safely    Time 8    Period Weeks    Status New    Target Date 07/22/20      PT LONG TERM GOAL #5   Title Pt will be able to turn neck  without pain or waking at night    Baseline Pt has difficulty sleeping and wakes every 2 to 3 hours due to pain    Time 8    Period Weeks    Status New    Target Date 07/22/20                 Plan - 06/09/20 0849    Clinical Impression Statement Pt arrives with no pain today. Discussed positioning in seated and supine to promote a more neutral cervical spine. Increased resistance for UE strengthening wiht good tolerance. She was also able to sit-stand x 10 without UE today. She reported feeling good after session. She had a ittle discomfort with supine chin tucks. She climbed reciprocal 8 staris  with light UE assist and good safety. LLE fatigued with repetetive step ups.    PT Next Visit Plan DEconditioned.  Neck strength.  pectoral stretch  PT Home Exercise Plan [email or web address]           Patient will benefit from skilled therapeutic intervention in order to improve the following deficits and impairments:  Pain, Postural dysfunction, Improper body mechanics, Impaired UE functional use, Impaired flexibility, Difficulty walking, Decreased mobility, Decreased range of motion, Decreased activity tolerance, Decreased balance  Visit Diagnosis: Cervicalgia  Muscle weakness (generalized)  Chronic pain of left knee  Chronic pain of right knee  Difficulty in walking, not elsewhere classified  Abnormal posture     Problem List Patient Active Problem List   Diagnosis Date Noted  . Generalized weakness 05/20/2020  . Protein calorie malnutrition ([place]) 05/20/2020    . History of breast cancer 05/20/2020  . Cough 06/30/2012  . Heme + stool 03/15/2012  . Symptomatic anemia 03/15/2012  . Necrotizing pneumonia ([place]) 03/06/2012  . Cholelithiasis 03/01/2012  . Abnormal finding on GI tract imaging 03/01/2012  . Occasional numbness/prickling/tingling of fingers and toes 12/15/2011  . Hypokalemia 12/15/2011  . Hypomagnesemia 08/06/2011  . Unspecified episodic mood disorder 07/24/2011  . Tobacco abuse 11/03/2010  . HYPERTRIGLYCERIDEMIA 01/25/2009  . H/O macrocytic anemia 10/30/2007  . Alcohol dependence ([place]) 10/11/2006  . NEUROPATHY, PERIPHERAL 10/11/2006  . HYPERTENSION, BENIGN SYSTEMIC 10/11/2006    Dorene Ar, PTA 06/09/2020, 8:53 AM  [facility] [address] [place], [place], [address] Phone: [phone]   Fax:  [phone]  Name: [name] MRN: [id number] Date of Birth: 1961-02-01

## 2020-06-09 NOTE — Patient Instructions (Signed)
Access Code: [email or web address] URL: [email or web address] Date: 06/09/2020 Prepared by: Hessie Diener  Exercises  Standing Scapular Retraction in Abduction - 1 x daily - 7 x weekly - 2 sets - 10 reps - 5 hold

## 2020-06-11 ENCOUNTER — Ambulatory Visit: Payer: Medicaid Other

## 2020-06-11 ENCOUNTER — Other Ambulatory Visit: Payer: Self-pay

## 2020-06-11 DIAGNOSIS — M542 Cervicalgia: Secondary | ICD-10-CM | POA: Diagnosis not present

## 2020-06-11 DIAGNOSIS — G8929 Other chronic pain: Secondary | ICD-10-CM

## 2020-06-11 DIAGNOSIS — R262 Difficulty in walking, not elsewhere classified: Secondary | ICD-10-CM

## 2020-06-11 DIAGNOSIS — R293 Abnormal posture: Secondary | ICD-10-CM

## 2020-06-11 DIAGNOSIS — M6281 Muscle weakness (generalized): Secondary | ICD-10-CM

## 2020-06-11 NOTE — Therapy (Signed)
[place], [place], [address] Phone: [phone]   Fax:  [phone]  Physical Therapy Treatment  Patient Details  Name: [name] MRN: [id number] Date of Birth: 02-09-1961 Referring Provider (PT): August Luz A PA   Encounter Date: 06/11/2020   PT End of Session - 06/11/20 1450    Visit Number 4    Number of Visits 16    Date for PT Re-Evaluation 07/22/20    Authorization Time Period [facility] MCD    PT Start Time 0833    PT Stop Time 0918    PT Time Calculation (min) 45 min    Activity Tolerance Patient tolerated treatment well;Patient limited by fatigue    Behavior During Therapy Flat affect           Past Medical History:  Diagnosis Date  . Alcoholic hepatitis   . Alcoholism ([place])   . Anemia   . Breast cancer ([place]) 2001   Rt side. Had lumpectomy, chemo and XRT by [facility]  . Hypertension   . Personal history of chemotherapy   . Personal history of radiation therapy   . Shortness of breath     Past Surgical History:  Procedure Laterality Date  . BREAST LUMPECTOMY Right 2001  . rt lumpectomy  2001    There were no vitals filed for this visit.   Subjective Assessment - 06/11/20 0842    Subjective Pt reports no neck pain today. Pt asked how she couldimprove th strength of her legs.    Patient Stated Goals I want to get stronger    Currently in Pain? No/denies    Pain Score 0-No pain                             [facility] Adult PT Treatment/Exercise - 06/11/20 0001      Exercises   Exercises Neck;Shoulder;Knee/Hip      Neck Exercises: Machines for Strengthening   Nustep L4 x 6 minutes     cues for posture      Neck Exercises: Standing   Neck Retraction 10 reps      Neck Exercises: Seated   X to V 15 reps   against wall   W Back 15 reps   against wall     Neck Exercises: Supine   Neck Retraction 10 reps    Neck Retraction Limitations towel       Knee/Hip  Exercises: Standing   Wall Squat 2 sets;15 reps      Knee/Hip Exercises: Supine   Straight Leg Raises Right;Left;3 sets;10 reps    Straight Leg Raises Limitations partial lift    Other Supine Knee/Hip Exercises Clams; 10x3; RTB      Knee/Hip Exercises: Sidelying   Hip ABduction Right;Left;3 sets;10 reps      Shoulder Exercises: ROM/Strengthening   X to V Arms --    Other ROM/Strengthening Exercises --      Neck Exercises: Stretches   Other Neck Stretches door way pec stretch  10 sec x 4                   PT Education - 06/11/20 1449    Education Details Progressed HEP for LE strengthening exs.    Person(s) Educated Patient    Methods Explanation;Demonstration;Tactile cues;Verbal cues;Handout    Comprehension Verbalized understanding;Returned demonstration;Verbal cues required;Tactile cues required;Need further instruction  PT Short Term Goals - 05/27/20 0845      PT SHORT TERM GOAL #1   Title Pt will be able to rise from chair without using hands    Baseline unable to rise presently   dependent onUE support    Time 3    Period Weeks    Status New    Target Date 06/17/20      PT SHORT TERM GOAL #2   Title Pt will be able to state at least 3 strategies for fall prevention    Baseline unaware of fall prevention strategies    Time 3    Period Weeks    Status New    Target Date 06/17/20      PT SHORT TERM GOAL #3   Title Pt will be gait trained utilizing best AD for prevention of falls as needed    Baseline has rolling walker at homw    Time 3    Period Weeks    Status New    Target Date 06/17/20             PT Long Term Goals - 05/27/20 0844      PT LONG TERM GOAL #1   Title Pt will be independent with advanced HEP given    Baseline no knowledge    Time 8    Period Weeks    Status New    Target Date 07/22/20      PT LONG TERM GOAL #2   Title Pt will be able to rise from floor to standing independently to decrease risk /fear of  falling    Baseline unable to do floor to stand x fer    Time 8    Period Weeks    Status New    Target Date 07/22/20      PT LONG TERM GOAL #3   Title Pt will improve L/R LE strength to >/= 4/5 with </= 2/10 pain to promote safety with walking/standing activities    Baseline Pt has had 3 known falls in past 6 months   Pain in neck 6/10 and LE  8/10    Time 8    Period Weeks    Status New    Target Date 07/22/20      PT LONG TERM GOAL #4   Title Pt will be able to negotiate steps and carry 25 # of groceries    Baseline Pt requires extra time and must use bil UE support to ascend /descend steps one step a time safely    Time 8    Period Weeks    Status New    Target Date 07/22/20      PT LONG TERM GOAL #5   Title Pt will be able to turn neck  without pain or waking at night    Baseline Pt has difficulty sleeping and wakes every 2 to 3 hours due to pain    Time 8    Period Weeks    Status New    Target Date 07/22/20                 Plan - 06/11/20 1500    Clinical Impression Statement Pt continues to report no neck pain today. PT continued with upper body and cervical strengthening to address posture. Additional LE exs were started for strengthening to address balance and functional mobility. a written HEp was provided pt. pt returned demonstration of the exs and tolerated the session well.  Personal Factors and Comorbidities Comorbidity 1;Comorbidity 2    Comorbidities See medical Chart  Breast CA , HTN  ETOH Hepatitis, SOB, malnutrition    Examination-Activity Limitations Transfers;Stand;Stairs;Locomotion Level;Squat    Examination-Participation Restrictions Laundry;Cleaning    Stability/Clinical Decision Making Evolving/Moderate complexity    Clinical Decision Making Moderate    Rehab Potential Good    PT Frequency 2x / week    PT Treatment/Interventions ADLs/Self Care Home Management;Aquatic Therapy;Cryotherapy;Electrical Stimulation;Iontophoresis 4mg /ml  Dexamethasone;Traction;Moist Heat;Ultrasound;Balance training;Therapeutic exercise;Therapeutic activities;Functional mobility training;Stair training;Gait training;Neuromuscular re-education;Patient/family education;Manual techniques;Passive range of motion;Dry needling;Taping;Joint Manipulations;Spinal Manipulations    PT Next Visit Plan DEconditioned.  Neck strength.  pectoral stretch    PT Home Exercise Plan [email or web address]    Consulted and Agree with Plan of Care Patient           Patient will benefit from skilled therapeutic intervention in order to improve the following deficits and impairments:  Pain, Postural dysfunction, Improper body mechanics, Impaired UE functional use, Impaired flexibility, Difficulty walking, Decreased mobility, Decreased range of motion, Decreased activity tolerance, Decreased balance  Visit Diagnosis: Cervicalgia  Chronic pain of left knee  Muscle weakness (generalized)  Chronic pain of right knee  Difficulty in walking, not elsewhere classified  Abnormal posture     Problem List Patient Active Problem List   Diagnosis Date Noted  . Generalized weakness 05/20/2020  . Protein calorie malnutrition ([place]) 05/20/2020  . History of breast cancer 05/20/2020  . Cough 06/30/2012  . Heme + stool 03/15/2012  . Symptomatic anemia 03/15/2012  . Necrotizing pneumonia ([place]) 03/06/2012  . Cholelithiasis 03/01/2012  . Abnormal finding on GI tract imaging 03/01/2012  . Occasional numbness/prickling/tingling of fingers and toes 12/15/2011  . Hypokalemia 12/15/2011  . Hypomagnesemia 08/06/2011  . Unspecified episodic mood disorder 07/24/2011  . Tobacco abuse 11/03/2010  . HYPERTRIGLYCERIDEMIA 01/25/2009  . H/O macrocytic anemia 10/30/2007  . Alcohol dependence ([place]) 10/11/2006  . NEUROPATHY, PERIPHERAL 10/11/2006  . HYPERTENSION, BENIGN SYSTEMIC 10/11/2006   Gar Ponto MS, PT 06/11/20 3:10 PM  [facility] [place] [facility] [address] [place], [place], [address] Phone: [phone]   Fax:  [phone]  Name: [name] MRN: [id number] Date of Birth: [birth year]

## 2020-06-15 ENCOUNTER — Encounter: Payer: Self-pay | Admitting: Oncology

## 2020-06-15 ENCOUNTER — Inpatient Hospital Stay: Payer: Medicaid Other | Attending: Oncology | Admitting: Oncology

## 2020-06-15 ENCOUNTER — Inpatient Hospital Stay: Payer: Medicaid Other

## 2020-06-15 ENCOUNTER — Ambulatory Visit: Payer: Medicaid Other | Admitting: Physical Therapy

## 2020-06-15 VITALS — BP 93/76 | HR 92 | Temp 96.0°F | Resp 16 | Wt 95.0 lb

## 2020-06-15 DIAGNOSIS — D539 Nutritional anemia, unspecified: Secondary | ICD-10-CM

## 2020-06-15 DIAGNOSIS — I10 Essential (primary) hypertension: Secondary | ICD-10-CM | POA: Diagnosis not present

## 2020-06-15 DIAGNOSIS — R531 Weakness: Secondary | ICD-10-CM | POA: Diagnosis not present

## 2020-06-15 DIAGNOSIS — D649 Anemia, unspecified: Secondary | ICD-10-CM | POA: Diagnosis not present

## 2020-06-15 DIAGNOSIS — D6489 Other specified anemias: Secondary | ICD-10-CM

## 2020-06-15 DIAGNOSIS — Z79899 Other long term (current) drug therapy: Secondary | ICD-10-CM | POA: Diagnosis not present

## 2020-06-15 DIAGNOSIS — F1020 Alcohol dependence, uncomplicated: Secondary | ICD-10-CM

## 2020-06-15 LAB — CBC WITH DIFFERENTIAL/PLATELET
Abs Immature Granulocytes: 0.01 10*3/uL (ref 0.00–0.07)
Basophils Absolute: 0 10*3/uL (ref 0.0–0.1)
Basophils Relative: 1 %
Eosinophils Absolute: 0 10*3/uL (ref 0.0–0.5)
Eosinophils Relative: 1 %
HCT: 26.7 % — ABNORMAL LOW (ref 36.0–46.0)
Hemoglobin: 9 g/dL — ABNORMAL LOW (ref 12.0–15.0)
Immature Granulocytes: 0 %
Lymphocytes Relative: 40 %
Lymphs Abs: 2 10*3/uL (ref 0.7–4.0)
MCH: 34.5 pg — ABNORMAL HIGH (ref 26.0–34.0)
MCHC: 33.7 g/dL (ref 30.0–36.0)
MCV: 102.3 fL — ABNORMAL HIGH (ref 80.0–100.0)
Monocytes Absolute: 0.1 10*3/uL (ref 0.1–1.0)
Monocytes Relative: 3 %
Neutro Abs: 2.8 10*3/uL (ref 1.7–7.7)
Neutrophils Relative %: 55 %
Platelets: 148 10*3/uL — ABNORMAL LOW (ref 150–400)
RBC: 2.61 MIL/uL — ABNORMAL LOW (ref 3.87–5.11)
RDW: 15.9 % — ABNORMAL HIGH (ref 11.5–15.5)
WBC: 5.1 10*3/uL (ref 4.0–10.5)
nRBC: 0 % (ref 0.0–0.2)

## 2020-06-15 LAB — IRON AND TIBC
Iron: 162 ug/dL (ref 28–170)
Saturation Ratios: 92 % — ABNORMAL HIGH (ref 10.4–31.8)
TIBC: 176 ug/dL — ABNORMAL LOW (ref 250–450)
UIBC: 14 ug/dL

## 2020-06-15 LAB — VITAMIN B12: Vitamin B-12: 405 pg/mL (ref 180–914)

## 2020-06-15 LAB — FERRITIN: Ferritin: 459 ng/mL — ABNORMAL HIGH (ref 11–307)

## 2020-06-17 ENCOUNTER — Telehealth: Payer: Self-pay | Admitting: Physical Therapy

## 2020-06-17 ENCOUNTER — Ambulatory Visit: Payer: Medicaid Other | Admitting: Physical Therapy

## 2020-06-17 NOTE — Telephone Encounter (Signed)
Pt did not show for appt.  Pt called and informed of the attendance policy and reminded of future appt.  Pt stated she was confused about the times.  She verbalized understanding of the attendance policy and will attend or call to cancel in the future.  Voncille Lo, PT, [place] Certified Exercise Expert for the Aging Adult  06/17/20 10:06 AM Phone: [phone] Fax: [phone]

## 2020-06-20 NOTE — Progress Notes (Signed)
Hematology/Oncology Consult note [facility]  Telephone:([phone] [phone]  Patient Care Team: Marliss Coots, NP as PCP - General   Name of the patient: [name]  [id number]  01-07-1961   Date of visit: 06/20/20  Diagnosis- normocytic and sometime macrocytic anemia likely due to alcohol use  Chief complaint/ Reason for visit- routine f/u of anemia  Heme/Onc history:Patient is a 59 year old African-American female with a past medical history significant for hypertension referred for anemia. Most recent CBC from 02/17/2020 showed white count of 5.1, H&H of 10.1/28.7 with an MCV of 104 and a platelet count of 122. LFTs and serum creatinine was normal. B12 levels were normal at 510.Folic acid and TSH was normal.  Patient has lost more than 10 pounds in the last 6 months. She continues to drink alcohol every day and its mixture of hard liquor as well as beer. She is unable to quantify what is her intake of alcohol but reports she takes at least 8 drinks daily and has been doing so for several years. She reports having weakness in her bilateral legs which is also a chronic issue. She reports having random episodes of nausea and she often has to throw up.Denies any abdominal pain. Denies any dark melanotic stools or bleeding in her stools  Results of blood work from 02/24/2020 showed normal B1 and B6 level.  LDH was mildly elevated at 221.  Coombs test was negative.  Myeloma panel was unremarkable.  Iron studies showed an elevated ferritin of 340.  Iron studies were normal.  CBC showed H&H of 9.8/29.3 with an MCV of 107.  White count was normal at 7.3 and platelets normal at 331.  CMP showed elevated AST of 131 and elevated total bilirubin of 1.3.  CT abdomen did not show any evidence of malignancy.  Severe hepatic steatosis.  Features of chronic pancreatitis.  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  She was given 2 units of blood transfusion.  She has also had EGD and colonoscopy in September 2021 which showed chronic gastritis.   ECOG PS- 2 Pain scale- 0  Review of systems- Review of Systems  Constitutional: Positive for malaise/fatigue. Negative for chills, fever and weight loss.  HENT: Negative for congestion, ear discharge and nosebleeds.   Eyes: Negative for blurred vision.  Respiratory: Negative for cough, hemoptysis, sputum production, shortness of breath and wheezing.   Cardiovascular: Negative for chest pain, palpitations, orthopnea and claudication.  Gastrointestinal: Negative for abdominal pain, blood in stool, constipation, diarrhea, heartburn, melena, nausea and vomiting.  Genitourinary: Negative for dysuria, flank pain, frequency, hematuria and urgency.  Musculoskeletal: Negative for back pain, joint pain and myalgias.  Skin: Negative for rash.  Neurological: Positive for sensory change (Tingling numbness in hands and feet). Negative for dizziness, tingling, focal weakness, seizures, weakness and headaches.  Endo/Heme/Allergies: Does not bruise/bleed easily.  Psychiatric/Behavioral: Negative for depression and suicidal ideas. The patient does not have insomnia.       No Known Allergies   Past Medical History:  Diagnosis Date  . Alcoholic hepatitis   . Alcoholism ([place])   . Anemia   . Breast cancer ([place]) 2001   Rt side. Had lumpectomy, chemo and XRT by [facility]  . Hypertension   . Personal history  of chemotherapy   . Personal history of radiation therapy   . Shortness of breath      Past Surgical History:  Procedure Laterality Date  . BREAST LUMPECTOMY Right 2001  . rt lumpectomy  2001    Social History   Socioeconomic History  . Marital status: Single      Spouse name: Not on file  . Number of children: Not on file  . Years of education: Not on file  . Highest education level: Not on file  Occupational History  . Not on file  Tobacco Use  . Smoking status: Former Smoker    Packs/day: 0.50    Types: Cigarettes    Quit date: 11/02/2000    Years since quitting: 19.6  . Smokeless tobacco: Never Used  . Tobacco comment: unknown but before 2002  Vaping Use  . Vaping Use: Never used  Substance and Sexual Activity  . Alcohol use: Yes    Alcohol/week: 42.0 standard drinks    Types: 42 Shots of liquor per week    Comment: -unknown amount pt. drinks beer and liquor  . Drug use: No  . Sexual activity: Not Currently  Other Topics Concern  . Not on file  Social History Narrative  . Not on file   Social Determinants of Health   Financial Resource Strain:   . Difficulty of Paying Living Expenses: Not on file  Food Insecurity:   . Worried About Charity fundraiser in the Last Year: Not on file  . Ran Out of Food in the Last Year: Not on file  Transportation Needs:   . Lack of Transportation (Medical): Not on file  . Lack of Transportation (Non-Medical): Not on file  Physical Activity:   . Days of Exercise per Week: Not on file  . Minutes of Exercise per Session: Not on file  Stress:   . Feeling of Stress : Not on file  Social Connections:   . Frequency of Communication with Friends and Family: Not on file  . Frequency of Social Gatherings with Friends and Family: Not on file  . Attends Religious Services: Not on file  . Active Member of Clubs or Organizations: Not on file  . Attends Archivist Meetings: Not on file  . Marital Status: Not on file  Intimate Partner Violence:   . Fear of Current or Ex-Partner: Not on file  . Emotionally Abused: Not on file  . Physically Abused: Not on file  . Sexually Abused: Not on file    Family History  Problem Relation Age of Onset  . Leukemia Brother   . Lung cancer Maternal  Aunt   . Colon cancer Neg Hx   . Stomach cancer Neg Hx   . Pancreatic cancer Neg Hx      Current Outpatient Medications:  .  amLODipine (NORVASC) 10 MG tablet, Take 1 tablet (10 mg total) by mouth daily., Disp: 30 tablet, Rfl: 0 .  feeding supplement, ENSURE ENLIVE, (ENSURE ENLIVE) LIQD, Take 237 mLs by mouth 2 (two) times daily between meals., Disp: 237 mL, Rfl: 12 .  Ferrous Sulfate (IRON) 325 (65 Fe) MG TABS, Take 1 tablet by mouth daily., Disp: , Rfl:  .  folic acid (FOLVITE) 1 MG tablet, Take 1 tablet (1 mg total) by mouth daily., Disp: 30 tablet, Rfl: 0 .  hydrALAZINE (APRESOLINE) 25 MG tablet, Take 1 tablet (25 mg total) by mouth 2 (two) times daily., Disp: 60 tablet, Rfl: 0 .  lisinopril (PRINIVIL,ZESTRIL)  40 MG tablet, Take 1 tablet (40 mg total) by mouth daily., Disp: 90 tablet, Rfl: 2 .  magnesium oxide (MAG-OX) 400 (241.3 Mg) MG tablet, Take 1 tablet (400 mg total) by mouth 2 (two) times daily., Disp: 60 tablet, Rfl: 0 .  meloxicam (MOBIC) 7.5 MG tablet, Take 7.5 mg by mouth 2 (two) times daily as needed., Disp: , Rfl:  .  Multiple Vitamin (MULTIVITAMIN WITH MINERALS) TABS tablet, Take 1 tablet by mouth daily., Disp: 30 tablet, Rfl: 0 .  pantoprazole (PROTONIX) 40 MG tablet, Take 1 tablet (40 mg total) by mouth 2 (two) times daily before a meal., Disp: 60 tablet, Rfl: 0 .  thiamine 100 MG tablet, Take 0.5 tablets (50 mg total) by mouth daily., Disp: 30 tablet, Rfl: 0 .  Vitamin D, Ergocalciferol, (DRISDOL) 1.25 MG (50000 UNIT) CAPS capsule, Take 50,000 Units by mouth every 7 (seven) days., Disp: , Rfl:   Physical exam:  Vitals:   06/15/20 1145  BP: 93/76  Pulse: 92  Resp: 16  Temp: (!) 96 F (35.6 C)  TempSrc: Tympanic  SpO2: 100%  Weight: 95 lb (43.1 kg)   Physical Exam Constitutional:      Comments: Patient is sitting in a wheelchair.  Appears fatigued  HENT:     Head: Normocephalic and atraumatic.  Eyes:     Pupils: Pupils are equal, round, and reactive to light.   Cardiovascular:     Rate and Rhythm: Normal rate and regular rhythm.     Heart sounds: Normal heart sounds.  Pulmonary:     Effort: Pulmonary effort is normal.     Breath sounds: Normal breath sounds.  Abdominal:     General: Bowel sounds are normal.     Palpations: Abdomen is soft.  Musculoskeletal:     Cervical back: Normal range of motion.  Skin:    General: Skin is warm and dry.  Neurological:     Mental Status: She is alert and oriented to person, place, and time.      CMP Latest Ref Rng & Units 05/22/2020  Glucose 70 - 99 mg/dL 74  BUN 6 - 20 mg/dL 14  Creatinine 0.44 - 1.00 mg/dL 0.98  Sodium 135 - 145 mmol/L 136  Potassium 3.5 - 5.1 mmol/L 4.5  Chloride 98 - 111 mmol/L 107  CO2 22 - 32 mmol/L 23  Calcium 8.9 - 10.3 mg/dL 7.6(L)  Total Protein 6.5 - 8.1 g/dL -  Total Bilirubin 0.3 - 1.2 mg/dL -  Alkaline Phos 38 - 126 U/L -  AST 15 - 41 U/L -  ALT 0 - 44 U/L -   CBC Latest Ref Rng & Units 06/15/2020  WBC 4.0 - 10.5 K/uL 5.1  Hemoglobin 12.0 - 15.0 g/dL 9.0(L)  Hematocrit 36 - 46 % 26.7(L)  Platelets 150 - 400 K/uL 148(L)     Assessment and plan- Patient is a 58 y.o. female with chronic normocytic/macrocytic anemia likely secondary to alcohol use.  Patient has had a complete peripheral anemia work-up done which did not show any reversible etiology.  She was admitted to the hospital a month ago with a hemoglobin of 6.6 and was given blood transfusion.  Today her hemoglobin is 9.  Even the past back in 2013 and 2014 she had similarly dropped her hemoglobin to low levels which then stabilized around 10-11.  Her B12 and iron numbers are presently normal in fact ferritin is elevated at 459 with an iron saturation of 92% likely secondary to  recent blood transfusions.  I do not see an indication to do a bone marrow biopsy at this time.  However if she keeps dropping her hemoglobin to less than 7 I could consider 1 down the line.  I will repeat a CBC with differential in 2 in  4 months and see her back in 4 months with repeat ferritin and iron studies B12 folate CMP LDH and reticulocyte count  Visit Diagnosis 1. Macrocytic anemia   2. Anemia secondary to alcohol [facility])      Dr. Randa Evens, MD, MPH [facility] [phone] 06/20/2020 9:16 AM

## 2020-06-22 ENCOUNTER — Ambulatory Visit: Payer: Medicaid Other | Attending: Physician Assistant | Admitting: Physical Therapy

## 2020-06-22 ENCOUNTER — Other Ambulatory Visit: Payer: Self-pay

## 2020-06-22 ENCOUNTER — Encounter: Payer: Self-pay | Admitting: Physical Therapy

## 2020-06-22 DIAGNOSIS — M25561 Pain in right knee: Secondary | ICD-10-CM | POA: Insufficient documentation

## 2020-06-22 DIAGNOSIS — G8929 Other chronic pain: Secondary | ICD-10-CM | POA: Insufficient documentation

## 2020-06-22 DIAGNOSIS — R293 Abnormal posture: Secondary | ICD-10-CM | POA: Diagnosis present

## 2020-06-22 DIAGNOSIS — R262 Difficulty in walking, not elsewhere classified: Secondary | ICD-10-CM | POA: Diagnosis present

## 2020-06-22 DIAGNOSIS — M6281 Muscle weakness (generalized): Secondary | ICD-10-CM | POA: Diagnosis present

## 2020-06-22 DIAGNOSIS — M542 Cervicalgia: Secondary | ICD-10-CM | POA: Diagnosis present

## 2020-06-22 DIAGNOSIS — M25562 Pain in left knee: Secondary | ICD-10-CM | POA: Insufficient documentation

## 2020-06-22 NOTE — Patient Instructions (Signed)

## 2020-06-22 NOTE — Therapy (Signed)
[place], [place], [address] Phone: [phone]   Fax:  [phone]  Physical Therapy Treatment  Patient Details  Name: [name] MRN: [id number] Date of Birth: Dec 18, 1960 Referring Provider (PT): August Luz A PA   Encounter Date: 06/22/2020   PT End of Session - 06/22/20 0805    Visit Number 5    Number of Visits 16    Date for PT Re-Evaluation 07/22/20    Authorization Time Period [facility]    PT Start Time 0804    PT Stop Time 0845    PT Time Calculation (min) 41 min    Activity Tolerance Patient tolerated treatment well;Patient limited by fatigue    Behavior During Therapy Flat affect           Past Medical History:  Diagnosis Date  . Alcoholic hepatitis   . Alcoholism ([place])   . Anemia   . Breast cancer ([place]) 2001   Rt side. Had lumpectomy, chemo and XRT by [facility]  . Hypertension   . Personal history of chemotherapy   . Personal history of radiation therapy   . Shortness of breath     Past Surgical History:  Procedure Laterality Date  . BREAST LUMPECTOMY Right 2001  . rt lumpectomy  2001    There were no vitals filed for this visit.   Subjective Assessment - 06/22/20 0806    Subjective Pt with no neck pain today.  but sometimes I have pain in my neck occassionally.  I feel good about my exercises    Pertinent History See medical Chart  Breast CA , HTN  ETOH Hepatitis, SOB, malnutrition    How long can you sit comfortably? N/A    How long can you stand comfortably? I get tired easily   I have a walker but I dont use it    How long can you walk comfortably? does not walk for exercise and depends on sister    Patient Stated Goals I want to get stronger    Currently in Pain? No/denies    Pain Location Neck    Pain Score 6    Pain Location Knee    Pain Orientation Right;Left    Pain Descriptors / Indicators Aching              [facility] PT Assessment - 06/22/20 0001       Sit to Stand   Comments 5 x sTS 26.31 sec MUST use UE strength                         [facility] Adult PT Treatment/Exercise - 06/22/20 0001      Self-Care   Self-Care Other Self-Care Comments    Other Self-Care Comments  went over fall prevention handouts and  gave reminders so she could verbalize 3 strategies      Neck Exercises: Machines for Strengthening   Nustep L4 x 6 minutes     cues for posture      Neck Exercises: Supine   Neck Retraction 10 reps    Neck Retraction Limitations towel VC    Other Supine Exercise supine 3# UE dumbbells : chest press , elbows in then out x 10 each , Alt UE flex/extension x 20  , 2# tricep press x 20 each    with 1 pillow support      Knee/Hip Exercises: Standing   Other Standing Knee Exercises  eccentric lowering  stand to sit without UE support to increase strength.   2 x 10 with 1 minute rest in between      Knee/Hip Exercises: Supine   Bridges with Diona Foley Squeeze 2 sets;Strengthening;Both;10 reps    Straight Leg Raises Right;Left;3 sets;10 reps    Straight Leg Raises Limitations partial lift VC to increased AROM    Other Supine Knee/Hip Exercises Clams; 10x3; Red t band      Knee/Hip Exercises: Sidelying   Hip ABduction Right;Left;2 sets;10 reps    Hip ABduction Limitations fatigues at end of session    Clams sidelying clam with red t band 10 x on R and L                  PT Education - 06/22/20 0810    Education Details added to HEP Fall prevention and went over and had pt verbalize 3 strategies  and review HEP    Person(s) Educated Patient    Methods Explanation;Verbal cues;Tactile cues;Demonstration    Comprehension Verbalized understanding;Returned demonstration            PT Short Term Goals - 06/22/20 0807      PT SHORT TERM GOAL #1   Title Pt will be able to rise from chair without using hands    Baseline unable to rise presently   dependent onUE support 5 x with UE support 26.31 sec    Time 3    Period  Weeks    Status On-going    Target Date 06/17/20      PT SHORT TERM GOAL #2   Title Pt will be able to state at least 3 strategies for fall prevention    Baseline Pt given handout and verbalized 3  with reminders    Time 3    Period Weeks    Status On-going    Target Date 06/17/20      PT SHORT TERM GOAL #3   Title Pt will be gait trained utilizing best AD for prevention of falls as needed    Baseline Pt not using AD in clinic but needs UE to rise from chair    Time 3    Period Weeks    Status Achieved    Target Date 06/17/20             PT Long Term Goals - 05/27/20 0844      PT LONG TERM GOAL #1   Title Pt will be independent with advanced HEP given    Baseline no knowledge    Time 8    Period Weeks    Status New    Target Date 07/22/20      PT LONG TERM GOAL #2   Title Pt will be able to rise from floor to standing independently to decrease risk /fear of falling    Baseline unable to do floor to stand x fer    Time 8    Period Weeks    Status New    Target Date 07/22/20      PT LONG TERM GOAL #3   Title Pt will improve L/R LE strength to >/= 4/5 with </= 2/10 pain to promote safety with walking/standing activities    Baseline Pt has had 3 known falls in past 6 months   Pain in neck 6/10 and LE  8/10    Time 8    Period Weeks    Status New    Target Date 07/22/20      PT  LONG TERM GOAL #4   Title Pt will be able to negotiate steps and carry 25 # of groceries    Baseline Pt requires extra time and must use bil UE support to ascend /descend steps one step a time safely    Time 8    Period Weeks    Status New    Target Date 07/22/20      PT LONG TERM GOAL #5   Title Pt will be able to turn neck  without pain or waking at night    Baseline Pt has difficulty sleeping and wakes every 2 to 3 hours due to pain    Time 8    Period Weeks    Status New    Target Date 07/22/20                 Plan - 06/22/20 0826    Clinical Impression Statement Pt  continues to have no neck pain but reports she occasionally does have pain.   Pt tends to have flexioned posture and demonstrates LE weakness with 5 x STS with use of UE support needed 26.31 Sec.  STG #3 achieved due to pt not using AD to ambulate.  Pt was educated on fall prevention and given handouts.  Pt needed cuing and reminders to recall strategies  Pt needs Cuing and encouragement to continue execising and completing sets    Personal Factors and Comorbidities Comorbidity 1;Comorbidity 2    Comorbidities See medical Chart  Breast CA , HTN  ETOH Hepatitis, SOB, malnutrition    Examination-Activity Limitations Transfers;Stand;Stairs;Locomotion Level;Squat    PT Frequency 2x / week    PT Duration 8 weeks    PT Treatment/Interventions ADLs/Self Care Home Management;Aquatic Therapy;Cryotherapy;Electrical Stimulation;Iontophoresis 4mg /ml Dexamethasone;Traction;Moist Heat;Ultrasound;Balance training;Therapeutic exercise;Therapeutic activities;Functional mobility training;Stair training;Gait training;Neuromuscular re-education;Patient/family education;Manual techniques;Passive range of motion;Dry needling;Taping;Joint Manipulations;Spinal Manipulations    PT Next Visit Plan DEconditioned.  Neck strength.  pectoral stretch    PT Home Exercise Plan [id number]    Consulted and Agree with Plan of Care Patient           Patient will benefit from skilled therapeutic intervention in order to improve the following deficits and impairments:  Pain, Postural dysfunction, Improper body mechanics, Impaired UE functional use, Impaired flexibility, Difficulty walking, Decreased mobility, Decreased range of motion, Decreased activity tolerance, Decreased balance  Visit Diagnosis: Cervicalgia  Chronic pain of left knee  Muscle weakness (generalized)  Chronic pain of right knee  Difficulty in walking, not elsewhere classified  Abnormal posture     Problem List Patient Active Problem List   Diagnosis  Date Noted  . Generalized weakness 05/20/2020  . Protein calorie malnutrition ([place]) 05/20/2020  . History of breast cancer 05/20/2020  . Cough 06/30/2012  . Heme + stool 03/15/2012  . Symptomatic anemia 03/15/2012  . Necrotizing pneumonia ([place]) 03/06/2012  . Cholelithiasis 03/01/2012  . Abnormal finding on GI tract imaging 03/01/2012  . Occasional numbness/prickling/tingling of fingers and toes 12/15/2011  . Hypokalemia 12/15/2011  . Hypomagnesemia 08/06/2011  . Unspecified episodic mood disorder 07/24/2011  . Tobacco abuse 11/03/2010  . HYPERTRIGLYCERIDEMIA 01/25/2009  . H/O macrocytic anemia 10/30/2007  . Alcohol dependence ([place]) 10/11/2006  . NEUROPATHY, PERIPHERAL 10/11/2006  . HYPERTENSION, BENIGN SYSTEMIC 10/11/2006    Voncille Lo, PT, [place] Certified Exercise Expert for the Aging Adult  06/22/20 8:49 AM Phone: [phone] Fax: [place] [facility] [address] [place], [place], [address] Phone: [phone]   Fax:  [phone]  Name: JAIMARIE RAPOZO MRN: [id number] Date of Birth: 1960-11-10

## 2020-06-24 ENCOUNTER — Other Ambulatory Visit: Payer: Self-pay

## 2020-06-24 ENCOUNTER — Ambulatory Visit: Payer: Medicaid Other

## 2020-06-24 DIAGNOSIS — R262 Difficulty in walking, not elsewhere classified: Secondary | ICD-10-CM

## 2020-06-24 DIAGNOSIS — M542 Cervicalgia: Secondary | ICD-10-CM

## 2020-06-24 DIAGNOSIS — R293 Abnormal posture: Secondary | ICD-10-CM

## 2020-06-24 DIAGNOSIS — M6281 Muscle weakness (generalized): Secondary | ICD-10-CM

## 2020-06-24 DIAGNOSIS — G8929 Other chronic pain: Secondary | ICD-10-CM

## 2020-06-24 NOTE — Therapy (Signed)
[place], [place], [address] Phone: [phone]   Fax:  [phone]  Physical Therapy Treatment  Patient Details  Name: [name] MRN: [id number] Date of Birth: April 01, 1961 Referring Provider (PT): August Luz A PA   Encounter Date: 06/24/2020   PT End of Session - 06/24/20 0937    Visit Number 6    Number of Visits 16    Date for PT Re-Evaluation 07/22/20    Authorization Time Period [facility]CD    Progress Note Due on Visit 10    PT Start Time 0917    PT Stop Time 0955    PT Time Calculation (min) 38 min    Activity Tolerance Patient tolerated treatment well;Patient limited by fatigue    Behavior During Therapy Flat affect           Past Medical History:  Diagnosis Date  . Alcoholic hepatitis   . Alcoholism ([place])   . Anemia   . Breast cancer ([place]) 2001   Rt side. Had lumpectomy, chemo and XRT by [facility]  . Hypertension   . Personal history of chemotherapy   . Personal history of radiation therapy   . Shortness of breath     Past Surgical History:  Procedure Laterality Date  . BREAST LUMPECTOMY Right 2001  . rt lumpectomy  2001    There were no vitals filed for this visit.   Subjective Assessment - 06/24/20 0926    Subjective Pt states she is doing "OK". She reports no neck pain today and that is occur occasionally. She notes her legs are alittle stronger. pt notes she has completed ehr HEP 4x since her last appt.    Patient is accompained by: Family member    Patient Stated Goals I want to get stronger    Currently in Pain? No/denies    Pain Score 0-No pain    Pain Location Neck    Pain Orientation Right;Left    Pain Descriptors / Indicators Aching;Spasm    Pain Onset 1 to 4 weeks ago    Pain Score 5    Pain Location Knee    Pain Orientation Right;Left    Pain Descriptors / Indicators Aching    Pain Type Chronic pain    Pain Onset More than a month ago    Pain Frequency  Intermittent                             [facility] Adult PT Treatment/Exercise - 06/24/20 0001      Neck Exercises: Machines for Strengthening   Nustep L5 x 7 minutes; arms/legs     cues for posture      Knee/Hip Exercises: Aerobic   Nustep --      Knee/Hip Exercises: Seated   Sit to Sand 10 reps;without UE support   cue to look forward     Knee/Hip Exercises: Supine   Bridges with [employer] Both;2 sets;10 reps    Straight Leg Raises Right;Left;2 sets;10 reps    Straight Leg Raises Limitations VC for slower pace and complete flexion    Other Supine Knee/Hip Exercises Clams; 10x3; Red t band                    PT Short Term Goals - 06/22/20 0807      PT SHORT TERM GOAL #1   Title Pt will be able to rise from chair without  using hands    Baseline unable to rise presently   dependent onUE support 5 x with UE support 26.31 sec    Time 3    Period Weeks    Status On-going    Target Date 06/17/20      PT SHORT TERM GOAL #2   Title Pt will be able to state at least 3 strategies for fall prevention    Baseline Pt given handout and verbalized 3  with reminders    Time 3    Period Weeks    Status On-going    Target Date 06/17/20      PT SHORT TERM GOAL #3   Title Pt will be gait trained utilizing best AD for prevention of falls as needed    Baseline Pt not using AD in clinic but needs UE to rise from chair    Time 3    Period Weeks    Status Achieved    Target Date 06/17/20             PT Long Term Goals - 05/27/20 0844      PT LONG TERM GOAL #1   Title Pt will be independent with advanced HEP given    Baseline no knowledge    Time 8    Period Weeks    Status New    Target Date 07/22/20      PT LONG TERM GOAL #2   Title Pt will be able to rise from floor to standing independently to decrease risk /fear of falling    Baseline unable to do floor to stand x fer    Time 8    Period Weeks    Status New    Target Date 07/22/20      PT  LONG TERM GOAL #3   Title Pt will improve L/R LE strength to >/= 4/5 with </= 2/10 pain to promote safety with walking/standing activities    Baseline Pt has had 3 known falls in past 6 months   Pain in neck 6/10 and LE  8/10    Time 8    Period Weeks    Status New    Target Date 07/22/20      PT LONG TERM GOAL #4   Title Pt will be able to negotiate steps and carry 25 # of groceries    Baseline Pt requires extra time and must use bil UE support to ascend /descend steps one step a time safely    Time 8    Period Weeks    Status New    Target Date 07/22/20      PT LONG TERM GOAL #5   Title Pt will be able to turn neck  without pain or waking at night    Baseline Pt has difficulty sleeping and wakes every 2 to 3 hours due to pain    Time 8    Period Weeks    Status New    Target Date 07/22/20                 Plan - 06/24/20 0955    Clinical Impression Statement Pt's presentation of forward flexed trunk posture and general weakness continues.. Time frame for the PT session was cut short due to the pt experiencing bowel issues. Pt's report indicates more consistent completion of her HEP.    Personal Factors and Comorbidities Comorbidity 1;Comorbidity 2    Comorbidities See medical Chart  Breast CA , HTN  ETOH Hepatitis, SOB,  malnutrition    Examination-Activity Limitations Transfers;Stand;Stairs;Locomotion Level;Squat    Examination-Participation Restrictions Laundry;Cleaning    Stability/Clinical Decision Making Evolving/Moderate complexity    Clinical Decision Making Moderate    Rehab Potential Good    PT Frequency 2x / week    PT Duration 8 weeks    PT Treatment/Interventions ADLs/Self Care Home Management;Aquatic Therapy;Cryotherapy;Electrical Stimulation;Iontophoresis 4mg /ml Dexamethasone;Traction;Moist Heat;Ultrasound;Balance training;Therapeutic exercise;Therapeutic activities;Functional mobility training;Stair training;Gait training;Neuromuscular  re-education;Patient/family education;Manual techniques;Passive range of motion;Dry needling;Taping;Joint Manipulations;Spinal Manipulations    PT Next Visit Plan DEconditioned.  Neck strength.  pectoral stretch    PT Home Exercise Plan [id number]    Consulted and Agree with Plan of Care Patient           Patient will benefit from skilled therapeutic intervention in order to improve the following deficits and impairments:  Pain, Postural dysfunction, Improper body mechanics, Impaired UE functional use, Impaired flexibility, Difficulty walking, Decreased mobility, Decreased range of motion, Decreased activity tolerance, Decreased balance  Visit Diagnosis: Cervicalgia  Chronic pain of left knee  Muscle weakness (generalized)  Chronic pain of right knee  Difficulty in walking, not elsewhere classified  Abnormal posture     Problem List Patient Active Problem List   Diagnosis Date Noted  . Generalized weakness 05/20/2020  . Protein calorie malnutrition ([place]) 05/20/2020  . History of breast cancer 05/20/2020  . Cough 06/30/2012  . Heme + stool 03/15/2012  . Symptomatic anemia 03/15/2012  . Necrotizing pneumonia ([place]) 03/06/2012  . Cholelithiasis 03/01/2012  . Abnormal finding on GI tract imaging 03/01/2012  . Occasional numbness/prickling/tingling of fingers and toes 12/15/2011  . Hypokalemia 12/15/2011  . Hypomagnesemia 08/06/2011  . Unspecified episodic mood disorder 07/24/2011  . Tobacco abuse 11/03/2010  . HYPERTRIGLYCERIDEMIA 01/25/2009  . H/O macrocytic anemia 10/30/2007  . Alcohol dependence ([place]) 10/11/2006  . NEUROPATHY, PERIPHERAL 10/11/2006  . HYPERTENSION, BENIGN SYSTEMIC 10/11/2006    Gar Ponto MS, PT 06/24/20 10:06 AM  [facility] [address] [place], [place], [address] Phone: [phone]   Fax:  [phone]  Name: EDEN TOOHEY MRN: [id number] Date of Birth: March 21, 1961

## 2020-06-29 ENCOUNTER — Other Ambulatory Visit: Payer: Self-pay

## 2020-06-29 ENCOUNTER — Ambulatory Visit: Payer: Medicaid Other | Admitting: Physical Therapy

## 2020-06-29 ENCOUNTER — Encounter: Payer: Self-pay | Admitting: Physical Therapy

## 2020-06-29 DIAGNOSIS — R262 Difficulty in walking, not elsewhere classified: Secondary | ICD-10-CM

## 2020-06-29 DIAGNOSIS — M6281 Muscle weakness (generalized): Secondary | ICD-10-CM

## 2020-06-29 DIAGNOSIS — R293 Abnormal posture: Secondary | ICD-10-CM

## 2020-06-29 DIAGNOSIS — M25562 Pain in left knee: Secondary | ICD-10-CM

## 2020-06-29 DIAGNOSIS — M25561 Pain in right knee: Secondary | ICD-10-CM

## 2020-06-29 DIAGNOSIS — M542 Cervicalgia: Secondary | ICD-10-CM

## 2020-06-29 DIAGNOSIS — G8929 Other chronic pain: Secondary | ICD-10-CM

## 2020-06-29 NOTE — Therapy (Addendum)
[place], [place], [address] Phone: [phone]   Fax:  [phone]  Physical Therapy Treatment/Discharge Note  Patient Details  Name: [name] MRN: [id number] Date of Birth: 02-18-1961 Referring Provider (PT): August Luz A PA   Encounter Date: 06/29/2020   PT End of Session - 06/29/20 0809    Visit Number 7    Number of Visits 16    Date for PT Re-Evaluation 07/22/20    Authorization Time Period [facility] MCD    PT Start Time 0802    PT Stop Time 0845    PT Time Calculation (min) 43 min           Past Medical History:  Diagnosis Date   Alcoholic hepatitis    Alcoholism ([place])    Anemia    Breast cancer ([place]) 2001   Rt side. Had lumpectomy, chemo and XRT by [facility]   Hypertension    Personal history of chemotherapy    Personal history of radiation therapy    Shortness of breath     Past Surgical History:  Procedure Laterality Date   BREAST LUMPECTOMY Right 2001   rt lumpectomy  2001    There were no vitals filed for this visit.   Subjective Assessment - 06/29/20 0810    Subjective Pt reports back and legs are hurting today and rates the pain at 9/10. She points to pain at T-2 and bilateral knees. She verbalizes lower back pain.    Pertinent History See medical Chart  Breast CA , HTN  ETOH Hepatitis, SOB, malnutrition    Currently in Pain? Yes    Pain Score 9     Pain Location --   bilateral knees, base of neck and lower back   Pain Descriptors / Indicators Aching    Aggravating Factors  unsure    Pain Relieving Factors electric hot pack, lay down    Effect of Pain on Daily Activities electric hot pack, lay down              [facility] PT Assessment - 06/29/20 0001      AROM   Cervical - Right Rotation 50   no pain   Cervical - Left Rotation 50   no pain     Strength   Right Shoulder Flexion 4/5    Right Shoulder External Rotation 4/5    Left Shoulder Flexion 4/5      Left Shoulder External Rotation 4/5    Right Hand Grip (lbs) 11    Left Hand Grip (lbs) 26                         [facility] Adult PT Treatment/Exercise - 06/29/20 0001      Ambulation/Gait   Number of Stairs 12    Height of Stairs 6    Gait Comments up and down recipral stairs - 2 UE rails for safety       Therapeutic Activites    Therapeutic Activities Other Therapeutic Activities    Other Therapeutic Activities carry 2 7# dumbells in clinic 23f- she demonstrated unsteadiness      Neck Exercises: Machines for Strengthening   Nustep L5 x 8 minutes; arms/legs     cues for posture      Neck Exercises: Seated   W Back 10 reps    Postural Training cues for upright seated posture      Neck Exercises: Supine   Neck  Retraction 10 reps      Knee/Hip Exercises: Seated   Sit to Sand 10 reps;without UE support   cue to look forward     Knee/Hip Exercises: Supine   Straight Leg Raises Right;Left;2 sets;10 reps    Other Supine Knee/Hip Exercises Clams; 10x3; Red t band                    PT Short Term Goals - 06/29/20 3005      PT SHORT TERM GOAL #1   Title Pt will be able to rise from chair without using hands    Baseline 5STS without UE 30.2 sec , with UE 26.31 sec    Time 3    Period Weeks    Status Achieved      PT SHORT TERM GOAL #2   Title Pt will be able to state at least 3 strategies for fall prevention    Baseline Pt given handout and verbalized 3  with reminders    Time 3    Period Weeks    Status Achieved      PT SHORT TERM GOAL #3   Title Pt will be gait trained utilizing best AD for prevention of falls as needed    Baseline Pt not using AD in clinic but needs UE to rise from chair    Time 3    Period Weeks    Status Achieved             PT Long Term Goals - 06/29/20 0841      PT LONG TERM GOAL #1   Title Pt will be independent with advanced HEP given    Baseline has hep handouts and needs them to complete HEP    Time 8     Period Weeks    Status Partially Met      PT LONG TERM GOAL #2   Title Pt will be able to rise from floor to standing independently to decrease risk /fear of falling    Baseline able to demo floor to stand transfer in clinic using UE on mat table    Time 8    Period Weeks    Status Partially Met      PT LONG TERM GOAL #3   Title Pt will improve L/R LE strength to >/= 4/5 with </= 2/10 pain to promote safety with walking/standing activities    Baseline 4/5 hip flexion and shoulder flexion today. high levels of pain continue intermittently    Time 8    Period Weeks    Status Partially Met      PT LONG TERM GOAL #4   Title Pt will be able to negotiate steps and carry 25 # of groceries    Baseline able to climb reciprocal stairs using bilat UE    Time 8    Period Weeks    Status Partially Met      PT LONG TERM GOAL #5   Title Pt will be able to turn neck  without pain or waking at night    Baseline improved cervical rotation to 50    Time 8    Period Weeks    Status Partially Met                 Plan - 06/29/20 0957    Clinical Impression Statement Pt arrives reporting she may not have transportation to the rest of her appointments due to her sister not being able to bring her.  Discussed other options for transportation and she wanted to talk to her sister. She will call to adjust her schedule if needed but wanted to keep her remaining appointmnets at this time. She reported a higher level of pain today and asked about getting an increased dose of pain medication. She was instructed to discuss this with her MD. She demonstrated lethargy today and was slow to answer questions and follow instructions. She is able to demonstrate upright posture when cued. She could get up and down onto knees from floor using UE on mat to assist. She reports improved strength however continued severe episodes of neck, back and knee pain. She can rise from Mat table without UE however slows with  repetitions.    PT Next Visit Plan DEconditioned.  Neck strength.  pectoral stretch, does she still have transportation?    PT Home Exercise Plan [email or web address]           Patient will benefit from skilled therapeutic intervention in order to improve the following deficits and impairments:  Pain, Postural dysfunction, Improper body mechanics, Impaired UE functional use, Impaired flexibility, Difficulty walking, Decreased mobility, Decreased range of motion, Decreased activity tolerance, Decreased balance  Visit Diagnosis: Cervicalgia  Chronic pain of left knee  Muscle weakness (generalized)  Chronic pain of right knee  Difficulty in walking, not elsewhere classified  Abnormal posture     Problem List Patient Active Problem List   Diagnosis Date Noted   Generalized weakness 05/20/2020   Protein calorie malnutrition ([place]) 05/20/2020   History of breast cancer 05/20/2020   Cough 06/30/2012   Heme + stool 03/15/2012   Symptomatic anemia 03/15/2012   Necrotizing pneumonia ([place]) 03/06/2012   Cholelithiasis 03/01/2012   Abnormal finding on GI tract imaging 03/01/2012   Occasional numbness/prickling/tingling of fingers and toes 12/15/2011   Hypokalemia 12/15/2011   Hypomagnesemia 08/06/2011   Unspecified episodic mood disorder 07/24/2011   Tobacco abuse 11/03/2010   HYPERTRIGLYCERIDEMIA 01/25/2009   H/O macrocytic anemia 10/30/2007   Alcohol dependence ([place]) 10/11/2006   NEUROPATHY, PERIPHERAL 10/11/2006   HYPERTENSION, BENIGN SYSTEMIC 10/11/2006    Dorene Ar, PTA 06/29/2020, 10:09 AM  [facility] [address] [place], [place], [address] Phone: [phone]   Fax:  [phone]  Name: SHENEA GIACOBBE MRN: [id number] Date of Birth: [birth year]   PHYSICAL THERAPY DISCHARGE SUMMARY  Visits from Start of Care: 7  Current functional level related to goals / functional outcomes: Pt still with  pain and as above   Remaining deficits: painin neck as above   Education / Equipment: Iniital HEP Plan: Patient agrees to discharge.  Patient goals were partially met. Patient is being discharged due to the patient's request.  ?????    Pt with transportation issues.  Was offered alternatives but pt decided to DC and return to MD for fruther evaluation  Voncille Lo, PT, [place] Certified Exercise Expert for the Aging Adult  07/01/20 1:57 PM Phone: [phone] Fax: [phone]

## 2020-07-01 ENCOUNTER — Ambulatory Visit: Payer: Medicaid Other | Admitting: Physical Therapy

## 2020-07-06 ENCOUNTER — Encounter: Payer: Medicaid Other | Admitting: Physical Therapy

## 2020-07-12 ENCOUNTER — Encounter: Payer: Medicaid Other | Admitting: Physical Therapy

## 2020-07-14 ENCOUNTER — Encounter: Payer: Medicaid Other | Admitting: Physical Therapy

## 2020-07-20 ENCOUNTER — Encounter: Payer: Medicaid Other | Admitting: Physical Therapy

## 2020-08-16 ENCOUNTER — Inpatient Hospital Stay: Payer: Medicaid Other

## 2020-08-17 ENCOUNTER — Other Ambulatory Visit
Admission: RE | Admit: 2020-08-17 | Discharge: 2020-08-17 | Disposition: A | Discharge status: Home / Self Care | Payer: Medicaid Other | Source: Ambulatory Visit | Attending: Oncology | Admitting: Oncology

## 2020-08-17 ENCOUNTER — Inpatient Hospital Stay: Payer: Medicaid Other | Attending: Oncology

## 2020-08-17 DIAGNOSIS — D539 Nutritional anemia, unspecified: Secondary | ICD-10-CM | POA: Diagnosis present

## 2020-08-17 LAB — RETICULOCYTES
Immature Retic Fract: 36.4 % — ABNORMAL HIGH (ref 2.3–15.9)
RBC.: 2.25 MIL/uL — ABNORMAL LOW (ref 3.87–5.11)
Retic Count, Absolute: 54.9 10*3/uL (ref 19.0–186.0)
Retic Ct Pct: 2.4 % (ref 0.4–3.1)

## 2020-08-17 LAB — CBC WITH DIFFERENTIAL/PLATELET
Abs Immature Granulocytes: 0.02 10*3/uL (ref 0.00–0.07)
Basophils Absolute: 0 10*3/uL (ref 0.0–0.1)
Basophils Relative: 0 %
Eosinophils Absolute: 0.1 10*3/uL (ref 0.0–0.5)
Eosinophils Relative: 2 %
HCT: 25.3 % — ABNORMAL LOW (ref 36.0–46.0)
Hemoglobin: 8.2 g/dL — ABNORMAL LOW (ref 12.0–15.0)
Immature Granulocytes: 0 %
Lymphocytes Relative: 32 %
Lymphs Abs: 2.3 10*3/uL (ref 0.7–4.0)
MCH: 33.7 pg (ref 26.0–34.0)
MCHC: 32.4 g/dL (ref 30.0–36.0)
MCV: 104.1 fL — ABNORMAL HIGH (ref 80.0–100.0)
Monocytes Absolute: 0.3 10*3/uL (ref 0.1–1.0)
Monocytes Relative: 4 %
Neutro Abs: 4.5 10*3/uL (ref 1.7–7.7)
Neutrophils Relative %: 62 %
Platelets: 297 10*3/uL (ref 150–400)
RBC: 2.43 MIL/uL — ABNORMAL LOW (ref 3.87–5.11)
RDW: 14.7 % (ref 11.5–15.5)
WBC: 7.2 10*3/uL (ref 4.0–10.5)
nRBC: 0.3 % — ABNORMAL HIGH (ref 0.0–0.2)

## 2020-08-17 LAB — LACTATE DEHYDROGENASE: LDH: 291 U/L — ABNORMAL HIGH (ref 98–192)

## 2020-08-17 LAB — FOLATE: Folate: 5.8 ng/mL — ABNORMAL LOW (ref >5.9)

## 2020-08-17 LAB — VITAMIN B12: Vitamin B-12: 402 pg/mL (ref 180–914)

## 2020-09-28 ENCOUNTER — Telehealth: Payer: Self-pay | Admitting: Oncology

## 2020-09-28 NOTE — Telephone Encounter (Signed)
Spoke with pt to r/s appt on 10/15/31. Confirmed new date of 3/10. Sending AVS.

## 2020-10-14 ENCOUNTER — Other Ambulatory Visit: Payer: Medicaid Other

## 2020-10-14 ENCOUNTER — Ambulatory Visit: Payer: Medicaid Other | Admitting: Oncology

## 2020-10-21 ENCOUNTER — Inpatient Hospital Stay: Payer: Medicaid Other | Admitting: Oncology

## 2020-10-21 ENCOUNTER — Inpatient Hospital Stay: Payer: Medicaid Other

## 2020-11-08 ENCOUNTER — Other Ambulatory Visit: Payer: Self-pay | Admitting: *Deleted

## 2020-11-08 DIAGNOSIS — D539 Nutritional anemia, unspecified: Secondary | ICD-10-CM

## 2020-11-08 DIAGNOSIS — F1020 Alcohol dependence, uncomplicated: Secondary | ICD-10-CM

## 2020-11-11 ENCOUNTER — Inpatient Hospital Stay: Payer: Medicaid Other

## 2020-11-11 ENCOUNTER — Inpatient Hospital Stay: Payer: Medicaid Other | Admitting: Oncology

## 2020-11-26 ENCOUNTER — Inpatient Hospital Stay: Payer: Medicaid Other

## 2020-11-26 ENCOUNTER — Inpatient Hospital Stay: Payer: Medicaid Other | Admitting: Oncology

## 2020-12-10 ENCOUNTER — Inpatient Hospital Stay (HOSPITAL_BASED_OUTPATIENT_CLINIC_OR_DEPARTMENT_OTHER): Payer: Medicaid Other | Admitting: Oncology

## 2020-12-10 ENCOUNTER — Inpatient Hospital Stay: Payer: Medicaid Other | Attending: Oncology

## 2020-12-10 VITALS — BP 125/98 | HR 103 | Temp 96.4°F | Resp 14 | Wt 86.6 lb

## 2020-12-10 DIAGNOSIS — D638 Anemia in other chronic diseases classified elsewhere: Secondary | ICD-10-CM | POA: Insufficient documentation

## 2020-12-10 DIAGNOSIS — Z87891 Personal history of nicotine dependence: Secondary | ICD-10-CM | POA: Diagnosis not present

## 2020-12-10 DIAGNOSIS — D539 Nutritional anemia, unspecified: Secondary | ICD-10-CM

## 2020-12-10 DIAGNOSIS — Z79899 Other long term (current) drug therapy: Secondary | ICD-10-CM | POA: Insufficient documentation

## 2020-12-10 DIAGNOSIS — D649 Anemia, unspecified: Secondary | ICD-10-CM | POA: Diagnosis not present

## 2020-12-10 DIAGNOSIS — E538 Deficiency of other specified B group vitamins: Secondary | ICD-10-CM | POA: Insufficient documentation

## 2020-12-10 DIAGNOSIS — F1020 Alcohol dependence, uncomplicated: Secondary | ICD-10-CM

## 2020-12-10 LAB — CBC WITH DIFFERENTIAL/PLATELET
Abs Immature Granulocytes: 0.06 10*3/uL (ref 0.00–0.07)
Basophils Absolute: 0 10*3/uL (ref 0.0–0.1)
Basophils Relative: 0 %
Eosinophils Absolute: 0 10*3/uL (ref 0.0–0.5)
Eosinophils Relative: 0 %
HCT: 20.7 % — ABNORMAL LOW (ref 36.0–46.0)
Hemoglobin: 6.8 g/dL — ABNORMAL LOW (ref 12.0–15.0)
Immature Granulocytes: 1 %
Lymphocytes Relative: 23 %
Lymphs Abs: 1.8 10*3/uL (ref 0.7–4.0)
MCH: 32.4 pg (ref 26.0–34.0)
MCHC: 32.9 g/dL (ref 30.0–36.0)
MCV: 98.6 fL (ref 80.0–100.0)
Monocytes Absolute: 0.4 10*3/uL (ref 0.1–1.0)
Monocytes Relative: 5 %
Neutro Abs: 5.6 10*3/uL (ref 1.7–7.7)
Neutrophils Relative %: 71 %
Platelets: 342 10*3/uL (ref 150–400)
RBC: 2.1 MIL/uL — ABNORMAL LOW (ref 3.87–5.11)
RDW: 17.9 % — ABNORMAL HIGH (ref 11.5–15.5)
WBC: 7.9 10*3/uL (ref 4.0–10.5)
nRBC: 0.3 % — ABNORMAL HIGH (ref 0.0–0.2)

## 2020-12-10 LAB — IRON AND TIBC
Iron: 44 ug/dL (ref 28–170)
Saturation Ratios: 26 % (ref 10.4–31.8)
TIBC: 171 ug/dL — ABNORMAL LOW (ref 250–450)
UIBC: 127 ug/dL

## 2020-12-10 LAB — RETICULOCYTES
Immature Retic Fract: 48.6 % — ABNORMAL HIGH (ref 2.3–15.9)
RBC.: 2.17 MIL/uL — ABNORMAL LOW (ref 3.87–5.11)
Retic Count, Absolute: 58.4 10*3/uL (ref 19.0–186.0)
Retic Ct Pct: 2.7 % (ref 0.4–3.1)

## 2020-12-10 LAB — COMPREHENSIVE METABOLIC PANEL
ALT: 10 U/L (ref 0–44)
AST: 28 U/L (ref 15–41)
Albumin: 2.3 g/dL — ABNORMAL LOW (ref 3.5–5.0)
Alkaline Phosphatase: 129 U/L — ABNORMAL HIGH (ref 38–126)
Anion gap: 15 (ref 5–15)
BUN: 33 mg/dL — ABNORMAL HIGH (ref 6–20)
CO2: 25 mmol/L (ref 22–32)
Calcium: 7.8 mg/dL — ABNORMAL LOW (ref 8.9–10.3)
Chloride: 91 mmol/L — ABNORMAL LOW (ref 98–111)
Creatinine, Ser: 1.08 mg/dL — ABNORMAL HIGH (ref 0.44–1.00)
GFR, Estimated: 60 mL/min — ABNORMAL LOW (ref >60)
Glucose, Bld: 103 mg/dL — ABNORMAL HIGH (ref 70–99)
Potassium: 3.7 mmol/L (ref 3.5–5.1)
Sodium: 131 mmol/L — ABNORMAL LOW (ref 135–145)
Total Bilirubin: 0.9 mg/dL (ref 0.3–1.2)
Total Protein: 7.9 g/dL (ref 6.5–8.1)

## 2020-12-10 LAB — LACTATE DEHYDROGENASE: LDH: 129 U/L (ref 98–192)

## 2020-12-10 LAB — FERRITIN: Ferritin: 274 ng/mL (ref 11–307)

## 2020-12-10 LAB — FOLATE: Folate: 5.5 ng/mL — ABNORMAL LOW (ref >5.9)

## 2020-12-10 LAB — VITAMIN B12: Vitamin B-12: 492 pg/mL (ref 180–914)

## 2020-12-10 NOTE — Progress Notes (Signed)
Pt states when she starts to feel sick to her stomach she has difficulty swallowing and eating.

## 2020-12-12 ENCOUNTER — Encounter: Payer: Self-pay | Admitting: Oncology

## 2020-12-12 NOTE — Progress Notes (Signed)
Hematology/Oncology Consult note [facility]  [phone] [phone]  Patient Care Team: Marliss Coots, NP as PCP - General   Name of the patient: [name]  [id number]  1961-08-14   Date of visit: 12/12/20  Diagnosis-macrocytic/normocytic anemia likely secondary to alcohol abuse  Chief complaint/ Reason for visit-routine follow-up of anemia  Heme/Onc history: Patient is a 60 year old African-American female with a past medical history significant for hypertension referred for anemia. Most recent CBC from 02/17/2020 showed white count of 5.1, H&H of 10.1/28.7 with an MCV of 104 and a platelet count of 122. LFTs and serum creatinine was normal. B12 levels were normal at 510.Folic acid and TSH was normal.  Patient has lost more than 10 pounds in the last 6 months. She continues to drink alcohol every day and its mixture of hard liquor as well as beer. She is unable to quantify what is her intake of alcohol but reports she takes at least 8 drinks daily and has been doing so for several years. She reports having weakness in her bilateral legs which is also a chronic issue. She reports having random episodes of nausea and she often has to throw up.Denies any abdominal pain. Denies any dark melanotic stools or bleeding in her stools  Results of blood work from 02/24/2020 showed normal B1 and B6 level. LDH was mildly elevated at 221. Coombs test was negative. Myeloma panel was unremarkable. Iron studies showed an elevated ferritin of 340. Iron studies were normal. CBC showed H&H of 9.8/29.3 with an MCV of 107. White count was normal at 7.3 and platelets normal at 331. CMP showed elevated AST of 131 and elevated total bilirubin of 1.3. CT abdomen did not show any evidence of malignancy. Severe hepatic steatosis. Features of chronic pancreatitis.  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  Reports ongoing abdominal pain and nausea.  Reports some difficulty swallowing  ECOG PS- 3 Pain scale- 4   Review of systems- Review of Systems  Constitutional: Positive for malaise/fatigue. Negative for chills, fever and weight loss.  HENT: Negative for congestion, ear discharge and nosebleeds.   Eyes: Negative for blurred vision.  Respiratory: Negative for cough, hemoptysis, sputum production, shortness of breath and wheezing.   Cardiovascular: Negative for chest pain, palpitations, orthopnea and claudication.  Gastrointestinal: Positive for abdominal pain and nausea. Negative for blood in stool, constipation, diarrhea, heartburn, melena and vomiting.  Genitourinary: Negative for dysuria, flank pain, frequency, hematuria and urgency.  Musculoskeletal: Negative for back pain, joint pain and myalgias.  Skin: Negative for rash.  Neurological: Negative for dizziness, tingling, focal weakness, seizures, weakness and headaches.  Endo/Heme/Allergies: Does not bruise/bleed easily.  Psychiatric/Behavioral: Negative for depression and suicidal ideas. The patient does not have insomnia.       No Known Allergies   Past Medical History:  Diagnosis Date  . Alcoholic hepatitis   . Alcoholism ([employer])   . Anemia   . Breast cancer ([place]) 2001   Rt side. Had lumpectomy, chemo and XRT by [facility]  . Hypertension   . Personal history of chemotherapy   . Personal history of radiation therapy   . Shortness of breath      Past Surgical History:  Procedure Laterality Date  . BREAST LUMPECTOMY Right 2001  . rt lumpectomy  2001    Social History   Socioeconomic History  . Marital status: Single  Spouse name: Not on file  . Number of children: Not on file  . Years of education: Not on file  . Highest education level: Not on file  Occupational History  . Not on file  Tobacco Use  . Smoking status: Former Smoker    Packs/day: 0.50     Types: Cigarettes    Quit date: 11/02/2000    Years since quitting: 20.1  . Smokeless tobacco: Never Used  . Tobacco comment: unknown but before 2002  Vaping Use  . Vaping Use: Never used  Substance and Sexual Activity  . Alcohol use: Yes    Alcohol/week: 42.0 standard drinks    Types: 42 Shots of liquor per week    Comment: -unknown amount pt. drinks beer and liquor  . Drug use: No  . Sexual activity: Not Currently  Other Topics Concern  . Not on file  Social History Narrative  . Not on file   Social Determinants of Health   Financial Resource Strain: Not on file  Food Insecurity: Not on file  Transportation Needs: Not on file  Physical Activity: Not on file  Stress: Not on file  Social Connections: Not on file  Intimate Partner Violence: Not on file    Family History  Problem Relation Age of Onset  . Leukemia Brother   . Lung cancer Maternal Aunt   . Colon cancer Neg Hx   . Stomach cancer Neg Hx   . Pancreatic cancer Neg Hx      Current Outpatient Medications:  .  Ferrous Sulfate (IRON) 325 (65 Fe) MG TABS, Take 1 tablet by mouth daily., Disp: , Rfl:  .  folic acid (FOLVITE) 1 MG tablet, Take 1 tablet (1 mg total) by mouth daily., Disp: 30 tablet, Rfl: 0 .  hydrALAZINE (APRESOLINE) 25 MG tablet, Take 1 tablet (25 mg total) by mouth 2 (two) times daily., Disp: 60 tablet, Rfl: 0 .  hydrochlorothiazide (HYDRODIURIL) 25 MG tablet, Take 1 tablet by mouth daily., Disp: , Rfl:  .  lisinopril (PRINIVIL,ZESTRIL) 40 MG tablet, Take 1 tablet (40 mg total) by mouth daily., Disp: 90 tablet, Rfl: 2 .  amLODipine (NORVASC) 10 MG tablet, Take 1 tablet (10 mg total) by mouth daily. (Patient not taking: Reported on 12/10/2020), Disp: 30 tablet, Rfl: 0 .  feeding supplement, ENSURE ENLIVE, (ENSURE ENLIVE) LIQD, Take 237 mLs by mouth 2 (two) times daily between meals. (Patient not taking: Reported on 12/10/2020), Disp: 237 mL, Rfl: 12 .  magnesium oxide (MAG-OX) 400 (241.3 Mg) MG tablet,  Take 1 tablet (400 mg total) by mouth 2 (two) times daily. (Patient not taking: Reported on 12/10/2020), Disp: 60 tablet, Rfl: 0 .  meloxicam (MOBIC) 7.5 MG tablet, Take 7.5 mg by mouth 2 (two) times daily as needed. (Patient not taking: Reported on 12/10/2020), Disp: , Rfl:  .  Multiple Vitamin (MULTIVITAMIN WITH MINERALS) TABS tablet, Take 1 tablet by mouth daily. (Patient not taking: Reported on 12/10/2020), Disp: 30 tablet, Rfl: 0 .  pantoprazole (PROTONIX) 40 MG tablet, Take 1 tablet (40 mg total) by mouth 2 (two) times daily before a meal. (Patient not taking: Reported on 12/10/2020), Disp: 60 tablet, Rfl: 0 .  thiamine 100 MG tablet, Take 0.5 tablets (50 mg total) by mouth daily. (Patient not taking: Reported on 12/10/2020), Disp: 30 tablet, Rfl: 0 .  Vitamin D, Ergocalciferol, (DRISDOL) 1.25 MG (50000 UNIT) CAPS capsule, Take 50,000 Units by mouth every 7 (seven) days. (Patient not taking: Reported on 12/10/2020), Disp: ,  Rfl:   Physical exam:  Vitals:   12/10/20 1051  BP: (!) 125/98  Pulse: (!) 103  Resp: 14  Temp: (!) 96.4 F (35.8 C)  SpO2: 100%  Weight: 86 lb 9.6 oz (39.3 kg)   Physical Exam Constitutional:      Comments: Patient is thin and extremely cachectic  Cardiovascular:     Rate and Rhythm: Normal rate and regular rhythm.     Heart sounds: Normal heart sounds.  Pulmonary:     Effort: Pulmonary effort is normal.     Breath sounds: Normal breath sounds.  Abdominal:     General: Bowel sounds are normal. There is no distension.     Palpations: Abdomen is soft.     Tenderness: There is no abdominal tenderness.  Skin:    General: Skin is warm and dry.  Neurological:     Mental Status: She is alert and oriented to person, place, and time.      CMP Latest Ref Rng & Units 12/10/2020  Glucose 70 - 99 mg/dL 103(H)  BUN 6 - 20 mg/dL 33(H)  Creatinine 0.44 - 1.00 mg/dL 1.08(H)  Sodium 135 - 145 mmol/L 131(L)  Potassium 3.5 - 5.1 mmol/L 3.7  Chloride 98 - 111 mmol/L 91(L)   CO2 22 - 32 mmol/L 25  Calcium 8.9 - 10.3 mg/dL 7.8(L)  Total Protein 6.5 - 8.1 g/dL 7.9  Total Bilirubin 0.3 - 1.2 mg/dL 0.9  Alkaline Phos 38 - 126 U/L 129(H)  AST 15 - 41 U/L 28  ALT 0 - 44 U/L 10   CBC Latest Ref Rng & Units 12/10/2020  WBC 4.0 - 10.5 K/uL 7.9  Hemoglobin 12.0 - 15.0 g/dL 6.8(L)  Hematocrit 36.0 - 46.0 % 20.7(L)  Platelets 150 - 400 K/uL 342      Assessment and plan- Patient is a 60 y.o. female with chronic normocytic/macrocytic anemia likely secondary to chronic alcohol abuse   Patient's hemoglobin since October 2021 has been around 8.  She intermittently drops it to s less than 7 requiring a blood transfusion.  It is 6.8 today.  We will arrange for 1 unit of PRBC next week.  White count and platelets are normal.  Anemia work-up today reveals a low folic acid and we will send her folic acids prescription of 2 mg daily but it is unclear if the patient is going to take it.  Iron studies are presently normal and indicated above anemia of chronic disease.  It may be worthwhile to consider a bone marrow biopsy at this time given her significant anemia which could all be due to alcohol consumption.  However patient declines a bone marrow biopsy at this time.  We will continue to monitor her H&H and I will see her back in 12 weeks interim CBC to be checked in 6 weeks   Visit Diagnosis 1. Macrocytic anemia   2. Symptomatic anemia   3. Folate deficiency      Dr. Randa Evens, MD, MPH [facility] [phone] 12/12/2020 2:19 PM

## 2020-12-13 ENCOUNTER — Inpatient Hospital Stay: Payer: Medicaid Other

## 2020-12-13 ENCOUNTER — Other Ambulatory Visit: Payer: Self-pay

## 2020-12-13 ENCOUNTER — Other Ambulatory Visit: Payer: Self-pay | Admitting: *Deleted

## 2020-12-13 ENCOUNTER — Other Ambulatory Visit: Payer: Self-pay | Admitting: Oncology

## 2020-12-13 ENCOUNTER — Inpatient Hospital Stay: Payer: Medicaid Other | Attending: Oncology

## 2020-12-13 DIAGNOSIS — D649 Anemia, unspecified: Secondary | ICD-10-CM

## 2020-12-13 DIAGNOSIS — Z79899 Other long term (current) drug therapy: Secondary | ICD-10-CM | POA: Diagnosis not present

## 2020-12-13 DIAGNOSIS — Z87891 Personal history of nicotine dependence: Secondary | ICD-10-CM | POA: Diagnosis not present

## 2020-12-13 DIAGNOSIS — E538 Deficiency of other specified B group vitamins: Secondary | ICD-10-CM | POA: Insufficient documentation

## 2020-12-13 DIAGNOSIS — D539 Nutritional anemia, unspecified: Secondary | ICD-10-CM | POA: Insufficient documentation

## 2020-12-13 DIAGNOSIS — D638 Anemia in other chronic diseases classified elsewhere: Secondary | ICD-10-CM | POA: Insufficient documentation

## 2020-12-13 LAB — CBC WITH DIFFERENTIAL/PLATELET
Abs Immature Granulocytes: 0.13 10*3/uL — ABNORMAL HIGH (ref 0.00–0.07)
Basophils Absolute: 0 10*3/uL (ref 0.0–0.1)
Basophils Relative: 0 %
Eosinophils Absolute: 0 10*3/uL (ref 0.0–0.5)
Eosinophils Relative: 0 %
HCT: 18.9 % — ABNORMAL LOW (ref 36.0–46.0)
Hemoglobin: 6.1 g/dL — ABNORMAL LOW (ref 12.0–15.0)
Immature Granulocytes: 1 %
Lymphocytes Relative: 19 %
Lymphs Abs: 2 10*3/uL (ref 0.7–4.0)
MCH: 32.6 pg (ref 26.0–34.0)
MCHC: 32.3 g/dL (ref 30.0–36.0)
MCV: 101.1 fL — ABNORMAL HIGH (ref 80.0–100.0)
Monocytes Absolute: 0.8 10*3/uL (ref 0.1–1.0)
Monocytes Relative: 8 %
Neutro Abs: 7.6 10*3/uL (ref 1.7–7.7)
Neutrophils Relative %: 72 %
Platelets: 299 10*3/uL (ref 150–400)
RBC: 1.87 MIL/uL — ABNORMAL LOW (ref 3.87–5.11)
RDW: 20.2 % — ABNORMAL HIGH (ref 11.5–15.5)
WBC: 10.6 10*3/uL — ABNORMAL HIGH (ref 4.0–10.5)
nRBC: 0 % (ref 0.0–0.2)

## 2020-12-13 LAB — PREPARE RBC (CROSSMATCH)

## 2020-12-13 MED ORDER — ACETAMINOPHEN 325 MG PO TABS
650.0000 mg | ORAL_TABLET | Freq: Once | ORAL | Status: AC
  Administered 2020-12-13: 650 mg via ORAL
  Filled 2020-12-13: qty 2

## 2020-12-13 MED ORDER — SODIUM CHLORIDE 0.9% IV SOLUTION
250.0000 mL | Freq: Once | INTRAVENOUS | Status: AC
  Administered 2020-12-13: 250 mL via INTRAVENOUS
  Filled 2020-12-13: qty 250

## 2020-12-13 MED ORDER — FOLIC ACID 1 MG PO TABS: 2.0000 mg | ORAL_TABLET | Freq: Every day | ORAL | 3 refills | Status: AC

## 2020-12-13 NOTE — Progress Notes (Signed)
I called pt and she said yes to folate 2 mg and I sent it to pharmacy and asked her to start it right away. She said she would

## 2020-12-13 NOTE — Patient Instructions (Signed)

## 2020-12-14 LAB — TYPE AND SCREEN
ABO/RH(D): O POS
Antibody Screen: POSITIVE
Donor AG Type: NEGATIVE
Unit division: 0
Unit division: 0

## 2020-12-14 LAB — BPAM RBC
Blood Product Expiration Date: 202205082359
Blood Product Expiration Date: 202205112359
ISSUE DATE / TIME: 202205021428
Unit Type and Rh: 5100
Unit Type and Rh: 5100

## 2021-01-12 DEATH — deceased

## 2021-01-21 ENCOUNTER — Other Ambulatory Visit: Payer: Medicaid Other

## 2021-03-04 ENCOUNTER — Ambulatory Visit: Payer: Medicaid Other | Admitting: Oncology

## 2021-03-04 ENCOUNTER — Other Ambulatory Visit: Payer: Medicaid Other
